# Patient Record
Sex: Male | Born: 1955 | Race: White | Hispanic: No | Marital: Married | Smoking: Former smoker
Health system: Southern US, Community
[De-identification: ages and names within clinical notes are randomized; demographics above are authoritative.]

## PROBLEM LIST (undated history)

## (undated) DIAGNOSIS — K760 Fatty (change of) liver, not elsewhere classified: Secondary | ICD-10-CM

## (undated) DIAGNOSIS — I10 Essential (primary) hypertension: Secondary | ICD-10-CM

## (undated) DIAGNOSIS — H20819 Fuchs' heterochromic cyclitis, unspecified eye: Secondary | ICD-10-CM

## (undated) DIAGNOSIS — I2510 Atherosclerotic heart disease of native coronary artery without angina pectoris: Secondary | ICD-10-CM

## (undated) DIAGNOSIS — G459 Transient cerebral ischemic attack, unspecified: Secondary | ICD-10-CM

## (undated) DIAGNOSIS — G4730 Sleep apnea, unspecified: Secondary | ICD-10-CM

## (undated) DIAGNOSIS — I4892 Unspecified atrial flutter: Principal | ICD-10-CM

## (undated) DIAGNOSIS — I639 Cerebral infarction, unspecified: Secondary | ICD-10-CM

## (undated) DIAGNOSIS — Z87442 Personal history of urinary calculi: Secondary | ICD-10-CM

## (undated) DIAGNOSIS — M1990 Unspecified osteoarthritis, unspecified site: Secondary | ICD-10-CM

## (undated) DIAGNOSIS — T884XXA Failed or difficult intubation, initial encounter: Secondary | ICD-10-CM

## (undated) DIAGNOSIS — M1711 Unilateral primary osteoarthritis, right knee: Secondary | ICD-10-CM

## (undated) DIAGNOSIS — J45909 Unspecified asthma, uncomplicated: Secondary | ICD-10-CM

## (undated) HISTORY — PX: KNEE CARTILAGE SURGERY: SHX688

## (undated) HISTORY — PX: TONSILLECTOMY: SUR1361

## (undated) HISTORY — PX: KIDNEY SURGERY: SHX687

## (undated) HISTORY — PX: BICEPS TENDON REPAIR: SHX566

## (undated) HISTORY — PX: EYE SURGERY: SHX253

## (undated) HISTORY — PX: CATARACT EXTRACTION W/ INTRAOCULAR LENS  IMPLANT, BILATERAL: SHX1307

## (undated) HISTORY — PX: HERNIA REPAIR: SHX51

---

## 1997-02-11 HISTORY — PX: CYSTOSCOPY W/ URETERAL STENT PLACEMENT: SHX1429

## 1997-10-10 ENCOUNTER — Emergency Department (HOSPITAL_COMMUNITY): Admission: EM | Admit: 1997-10-10 | Discharge: 1997-10-10 | Payer: Self-pay | Admitting: Emergency Medicine

## 1997-10-21 ENCOUNTER — Ambulatory Visit (HOSPITAL_COMMUNITY): Admission: RE | Admit: 1997-10-21 | Discharge: 1997-10-21 | Payer: Self-pay | Admitting: *Deleted

## 1997-10-25 ENCOUNTER — Ambulatory Visit (HOSPITAL_COMMUNITY): Admission: RE | Admit: 1997-10-25 | Discharge: 1997-10-25 | Payer: Self-pay | Admitting: *Deleted

## 1997-12-05 ENCOUNTER — Inpatient Hospital Stay (HOSPITAL_COMMUNITY): Admission: RE | Admit: 1997-12-05 | Discharge: 1997-12-09 | Payer: Self-pay | Admitting: Urology

## 1997-12-05 ENCOUNTER — Encounter: Payer: Self-pay | Admitting: Urology

## 1998-02-11 HISTORY — PX: OTHER SURGICAL HISTORY: SHX169

## 2000-07-10 ENCOUNTER — Ambulatory Visit (HOSPITAL_COMMUNITY): Admission: RE | Admit: 2000-07-10 | Discharge: 2000-07-10 | Payer: Self-pay | Admitting: Specialist

## 2000-07-16 ENCOUNTER — Encounter: Payer: Self-pay | Admitting: Specialist

## 2000-07-16 ENCOUNTER — Ambulatory Visit (HOSPITAL_COMMUNITY): Admission: RE | Admit: 2000-07-16 | Discharge: 2000-07-16 | Payer: Self-pay | Admitting: Specialist

## 2003-02-12 HISTORY — PX: ROTATOR CUFF REPAIR: SHX139

## 2004-08-01 ENCOUNTER — Ambulatory Visit (HOSPITAL_COMMUNITY): Admission: RE | Admit: 2004-08-01 | Discharge: 2004-08-01 | Payer: Self-pay | Admitting: Orthopedic Surgery

## 2005-06-12 ENCOUNTER — Encounter: Payer: Self-pay | Admitting: Emergency Medicine

## 2007-02-03 ENCOUNTER — Encounter: Admission: RE | Admit: 2007-02-03 | Discharge: 2007-02-03 | Payer: Self-pay | Admitting: Orthopedic Surgery

## 2008-02-12 DIAGNOSIS — G459 Transient cerebral ischemic attack, unspecified: Secondary | ICD-10-CM

## 2008-02-12 HISTORY — DX: Transient cerebral ischemic attack, unspecified: G45.9

## 2013-09-07 ENCOUNTER — Other Ambulatory Visit: Payer: Self-pay | Admitting: Family Medicine

## 2013-09-07 DIAGNOSIS — F17200 Nicotine dependence, unspecified, uncomplicated: Secondary | ICD-10-CM

## 2013-09-10 ENCOUNTER — Ambulatory Visit
Admission: RE | Admit: 2013-09-10 | Discharge: 2013-09-10 | Disposition: A | Discharge status: Home / Self Care | Payer: BC Managed Care – PPO | Source: Ambulatory Visit | Attending: Family Medicine | Admitting: Family Medicine

## 2013-09-10 DIAGNOSIS — F17200 Nicotine dependence, unspecified, uncomplicated: Secondary | ICD-10-CM

## 2013-10-12 ENCOUNTER — Emergency Department (HOSPITAL_COMMUNITY): Payer: BC Managed Care – PPO

## 2013-10-12 ENCOUNTER — Inpatient Hospital Stay (HOSPITAL_COMMUNITY)
Admission: EM | Admit: 2013-10-12 | Discharge: 2013-10-14 | DRG: 251 | Disposition: A | Discharge status: Home / Self Care | Payer: BC Managed Care – PPO | Attending: Cardiology | Admitting: Cardiology

## 2013-10-12 ENCOUNTER — Encounter (HOSPITAL_COMMUNITY): Payer: Self-pay | Admitting: Emergency Medicine

## 2013-10-12 DIAGNOSIS — I10 Essential (primary) hypertension: Secondary | ICD-10-CM | POA: Diagnosis present

## 2013-10-12 DIAGNOSIS — Z79899 Other long term (current) drug therapy: Secondary | ICD-10-CM

## 2013-10-12 DIAGNOSIS — R002 Palpitations: Secondary | ICD-10-CM | POA: Diagnosis not present

## 2013-10-12 DIAGNOSIS — Z885 Allergy status to narcotic agent status: Secondary | ICD-10-CM

## 2013-10-12 DIAGNOSIS — F17200 Nicotine dependence, unspecified, uncomplicated: Secondary | ICD-10-CM | POA: Diagnosis present

## 2013-10-12 DIAGNOSIS — I4892 Unspecified atrial flutter: Secondary | ICD-10-CM | POA: Diagnosis not present

## 2013-10-12 DIAGNOSIS — M129 Arthropathy, unspecified: Secondary | ICD-10-CM | POA: Diagnosis present

## 2013-10-12 DIAGNOSIS — Z8673 Personal history of transient ischemic attack (TIA), and cerebral infarction without residual deficits: Secondary | ICD-10-CM

## 2013-10-12 DIAGNOSIS — E876 Hypokalemia: Secondary | ICD-10-CM | POA: Diagnosis present

## 2013-10-12 HISTORY — DX: Unspecified atrial flutter: I48.92

## 2013-10-12 HISTORY — PX: ATRIAL ABLATION SURGERY: SHX560

## 2013-10-12 HISTORY — DX: Transient cerebral ischemic attack, unspecified: G45.9

## 2013-10-12 HISTORY — DX: Unspecified osteoarthritis, unspecified site: M19.90

## 2013-10-12 HISTORY — DX: Essential (primary) hypertension: I10

## 2013-10-12 LAB — CBC
HEMATOCRIT: 47.6 % (ref 39.0–52.0)
HEMOGLOBIN: 16.5 g/dL (ref 13.0–17.0)
MCH: 32.9 pg (ref 26.0–34.0)
MCHC: 34.7 g/dL (ref 30.0–36.0)
MCV: 95 fL (ref 78.0–100.0)
Platelets: 196 10*3/uL (ref 150–400)
RBC: 5.01 MIL/uL (ref 4.22–5.81)
RDW: 13 % (ref 11.5–15.5)
WBC: 12.6 10*3/uL — AB (ref 4.0–10.5)

## 2013-10-12 LAB — I-STAT CHEM 8, ED
BUN: 6 mg/dL (ref 6–23)
CALCIUM ION: 1.13 mmol/L (ref 1.12–1.23)
CHLORIDE: 104 meq/L (ref 96–112)
Creatinine, Ser: 1.2 mg/dL (ref 0.50–1.35)
GLUCOSE: 105 mg/dL — AB (ref 70–99)
HCT: 51 % (ref 39.0–52.0)
Hemoglobin: 17.3 g/dL — ABNORMAL HIGH (ref 13.0–17.0)
POTASSIUM: 3.2 meq/L — AB (ref 3.7–5.3)
Sodium: 139 mEq/L (ref 137–147)
TCO2: 26 mmol/L (ref 0–100)

## 2013-10-12 LAB — I-STAT TROPONIN, ED: TROPONIN I, POC: 0.02 ng/mL (ref 0.00–0.08)

## 2013-10-12 LAB — MAGNESIUM: MAGNESIUM: 2.3 mg/dL (ref 1.5–2.5)

## 2013-10-12 MED ORDER — OFF THE BEAT BOOK
Freq: Once | Status: AC
  Administered 2013-10-13
  Filled 2013-10-12 (×2): qty 1

## 2013-10-12 MED ORDER — HEPARIN (PORCINE) IN NACL 100-0.45 UNIT/ML-% IJ SOLN
1900.0000 [IU]/h | INTRAMUSCULAR | Status: AC
  Administered 2013-10-13: 1800 [IU]/h via INTRAVENOUS
  Administered 2013-10-13: 1600 [IU]/h via INTRAVENOUS
  Administered 2013-10-14: 1900 [IU]/h via INTRAVENOUS
  Filled 2013-10-12 (×4): qty 250

## 2013-10-12 MED ORDER — DILTIAZEM HCL 100 MG IV SOLR
20.0000 mg/h | INTRAVENOUS | Status: DC
  Administered 2013-10-12 – 2013-10-14 (×5): 20 mg/h via INTRAVENOUS
  Filled 2013-10-12 (×4): qty 100

## 2013-10-12 MED ORDER — DEXTROSE 5 % IV SOLN
5.0000 mg/h | Freq: Once | INTRAVENOUS | Status: AC
  Administered 2013-10-12: 10 mg/h via INTRAVENOUS

## 2013-10-12 MED ORDER — POTASSIUM CHLORIDE CRYS ER 20 MEQ PO TBCR
40.0000 meq | EXTENDED_RELEASE_TABLET | Freq: Once | ORAL | Status: AC
  Administered 2013-10-12: 40 meq via ORAL
  Filled 2013-10-12: qty 2

## 2013-10-12 MED ORDER — HYDROCHLOROTHIAZIDE 25 MG PO TABS
25.0000 mg | ORAL_TABLET | Freq: Every day | ORAL | Status: DC
  Administered 2013-10-13 – 2013-10-14 (×2): 25 mg via ORAL
  Filled 2013-10-12 (×2): qty 1

## 2013-10-12 MED ORDER — HEPARIN BOLUS VIA INFUSION
4000.0000 [IU] | Freq: Once | INTRAVENOUS | Status: AC
  Administered 2013-10-13: 4000 [IU] via INTRAVENOUS
  Filled 2013-10-12: qty 4000

## 2013-10-12 MED ORDER — HEPARIN BOLUS VIA INFUSION
5000.0000 [IU] | Freq: Once | INTRAVENOUS | Status: DC
  Filled 2013-10-12: qty 5000

## 2013-10-12 MED ORDER — DILTIAZEM HCL 25 MG/5ML IV SOLN
20.0000 mg | Freq: Once | INTRAVENOUS | Status: AC
  Administered 2013-10-12: 20 mg via INTRAVENOUS
  Filled 2013-10-12: qty 5

## 2013-10-12 NOTE — ED Notes (Signed)
Pt reports at 1500 today while working outside he started to feel weak and "my heart felt like it was fluttering." Pt was with physician at time who checked EKG and noted to be patient in A. Flutter, rate 140's. Pt reports weakness and mild dizziness. Denies CP or SOB. Pt currently in A Flutter, rate 130-150's. AO x 4. NAD.

## 2013-10-12 NOTE — Progress Notes (Signed)
ANTICOAGULATION CONSULT NOTE - Initial Consult  Pharmacy Consult for Heparin Indication: atrial fibrillation  Allergies  Allergen Reactions  . Codeine Itching    Patient Measurements: Height:  (193 cm) Weight: 238 lb 1.6 oz (108 kg) IBW/kg (Calculated) : 86.8 Heparin Dosing Weight: 108 kg  Vital Signs: Temp: 97.9 F (36.6 C) (09/01 2310) Temp src: Oral (09/01 2310) BP: 138/98 mmHg (09/01 2331) Pulse Rate: 135 (09/01 2331)  Labs:  Recent Labs  10/12/13 1925 10/12/13 2001  HGB 16.5 17.3*  HCT 47.6 51.0  PLT 196  --   CREATININE  --  1.20    Estimated Creatinine Clearance: 91.5 ml/min (by C-G formula based on Cr of 1.2).   Medical History: Past Medical History  Diagnosis Date  . Hypertension   . Arthritis     Medications:  Prescriptions prior to admission  Medication Sig Dispense Refill  . cyclobenzaprine (FLEXERIL) 10 MG tablet Take 10 mg by mouth 3 (three) times daily as needed for muscle spasms.      . hydrochlorothiazide (HYDRODIURIL) 25 MG tablet Take 25 mg by mouth daily.      Marland Kitchen ibuprofen (ADVIL,MOTRIN) 200 MG tablet Take 400 mg by mouth every 6 (six) hours as needed for mild pain.        Assessment: 58 y.o. male presents with "heart fluttering" and weakness. Pt found to be in afib. To begin heparin. CBC stable at baseline.  Goal of Therapy:  Heparin level 0.3-0.7 units/ml Monitor platelets by anticoagulation protocol: Yes   Plan:  1. Heparin IV bolus 4000 units 2. Heparin IV gtt at 1600 units/hr 3. Will f/u 6 hr heparin level  4. Daily heparin level and CBC  Christoper Fabian, PharmD, BCPS Clinical pharmacist, pager [phone] 10/12/2013,11:31 PM

## 2013-10-12 NOTE — ED Notes (Signed)
Receiving RN concerned of heart rate still in the 130's and level of potassium of 3.2.  Reported to Dr. Felix Pacini, MD advises that patient is stable, and is ordered /hr of cardizem presently for heart rate.  He gives verbal order of of K+ PO for electrolyte coverage.

## 2013-10-12 NOTE — ED Provider Notes (Signed)
CSN: [id number]     Arrival date & time 10/12/13  1912 History   First MD Initiated Contact with Patient 10/12/13 1940     Chief Complaint  Patient presents with  . Atrial Flutter     (Consider location/radiation/quality/duration/timing/severity/associated sxs/prior Treatment) HPI Complains of his heart racing onset approximately 3:30 PM today. Patient states he had a vague feeling of discomfort in his throat which lasted approximately one hour from 3:30 to 4:30 PM today. No other associated symptoms. He is presently asymptomatic. Denies shortness of breath denies chest pain no lightheadedness no treatment prior to coming here. He was evaluated by his primary care physician Dr. Jeannetta Nap who advised him to come here after an EKG was performed in the office which showed atrial flutter. Nothing makes symptoms better or worse. Past Medical History  Diagnosis Date  . Hypertension   . Arthritis    Past Surgical History  Procedure Laterality Date  . Hernia repair    . Tonsillectomy     No family history on file. History  Substance Use Topics  . Smoking status: Current Every Day Smoker -- 1.00 packs/day    Types: Cigarettes  . Smokeless tobacco: Not on file  . Alcohol Use: No    Review of Systems  Constitutional: Negative.   HENT: Negative.   Respiratory: Negative.   Cardiovascular: Positive for palpitations.  Gastrointestinal: Negative.   Musculoskeletal: Negative.   Skin: Negative.   Neurological: Negative.   Psychiatric/Behavioral: Negative.   All other systems reviewed and are negative.     Allergies  Codeine  Home Medications   Prior to Admission medications   Not on File   BP 147/118  Pulse 135  Temp(Src) 98.9 F (37.2 C) (Oral)  Resp 26  SpO2 97% Physical Exam  Nursing note and vitals reviewed. Constitutional: He appears well-developed and well-nourished.  HENT:  Head: Normocephalic and atraumatic.  Eyes: Conjunctivae are normal. Pupils are equal, round,  and reactive to light.  Neck: Neck supple. No tracheal deviation present. No thyromegaly present.  Cardiovascular: Regular rhythm.   No murmur heard. Tachycardic  Pulmonary/Chest: Effort normal and breath sounds normal.  Abdominal: Soft. Bowel sounds are normal. He exhibits no distension. There is no tenderness.  Musculoskeletal: Normal range of motion. He exhibits no edema and no tenderness.  Neurological: He is alert. Coordination normal.  Skin: Skin is warm and dry. No rash noted.  Psychiatric: He has a normal mood and affect.    ED Course  Procedures (including critical care time) Labs Review Labs Reviewed  CBC  MAGNESIUM  I-STAT TROPOININ, ED  I-STAT CHEM 8, ED    Imaging Review Dg Chest Port 1 View  10/12/2013   CLINICAL DATA:  ATRIAL FLUTTER  EXAM: PORTABLE CHEST - 1 VIEW  COMPARISON:  CT 09/10/2013  FINDINGS: Pulmonary hyperinflation with coarse peripheral interstitial markings. No focal infiltrate or overt edema. Heart size upper limits normal. Mildly tortuous thoracic aorta. No effusion. Visualized skeletal structures are unremarkable.  IMPRESSION: Hyperinflation without acute  disease.   Electronically Signed   By: Oley Balm M.D.   On: 10/12/2013 19:46     EKG Interpretation   Date/Time:  Tuesday October 12 2013 19:15:59 EDT Ventricular Rate:  137 PR Interval:    QRS Duration: 172 QT Interval:  378 QTC Calculation: 570 R Axis:   63 Text Interpretation:  Atrial flutter with variable A-V block Non-specific  intra-ventricular conduction block Abnormal ECG No old tracing to compare  Confirmed by Dr [facility]  MD, Doreatha Martin [phone]) on 10/12/2013 7:40:56 PM     8:30 PM patient remains tachycardic after treatment with Cardizem 20 mg IV bolus. Cardizem intravenous drip ordered. I've asked cardiology to consult and see patient in the emergency department  20 2:15 PM patient remains tachycardic on intravenous Cardizem drip. Results for orders placed during the hospital  encounter of 10/12/13  CBC      Result Value Ref Range   WBC 12.6 (*) 4.0 - 10.5 K/uL   RBC 5.01  4.22 - 5.81 MIL/uL   Hemoglobin 16.5  13.0 - 17.0 g/dL   HCT 60.4  54.0 - 98.1 %   MCV 95.0  78.0 - 100.0 fL   MCH 32.9  26.0 - 34.0 pg   MCHC 34.7  30.0 - 36.0 g/dL   RDW 19.1  47.8 - 29.5 %   Platelets 196  150 - 400 K/uL  MAGNESIUM      Result Value Ref Range   Magnesium 2.3  1.5 - 2.5 mg/dL  I-STAT TROPOININ, ED      Result Value Ref Range   Troponin i, poc 0.02  0.00 - 0.08 ng/mL   Comment 3           I-STAT CHEM 8, ED      Result Value Ref Range   Sodium 139  137 - 147 mEq/L   Potassium 3.2 (*) 3.7 - 5.3 mEq/L   Chloride 104  96 - 112 mEq/L   BUN 6  6 - 23 mg/dL   Creatinine, Ser 6.21  0.50 - 1.35 mg/dL   Glucose, Bld 308 (*) 70 - 99 mg/dL   Calcium, Ion 6.57  8.46 - 1.23 mmol/L   TCO2 26  0 - 100 mmol/L   Hemoglobin 17.3 (*) 13.0 - 17.0 g/dL   HCT 96.2  95.2 - 84.1 %   Dg Chest Port 1 View  10/12/2013   CLINICAL DATA:  ATRIAL FLUTTER  EXAM: PORTABLE CHEST - 1 VIEW  COMPARISON:  CT 09/10/2013  FINDINGS: Pulmonary hyperinflation with coarse peripheral interstitial markings. No focal infiltrate or overt edema. Heart size upper limits normal. Mildly tortuous thoracic aorta. No effusion. Visualized skeletal structures are unremarkable.  IMPRESSION: Hyperinflation without acute  disease.   Electronically Signed   By: Oley Balm M.D.   On: 10/12/2013 19:46    MDM  Dr.Vora from cardiology service evaluate patient and made arrangements for admission to step down unit. Final diagnoses:  None   Dx#1 atrial flutter #2 hypokalemia CRITICAL CARE Performed by: Doug Sou Total critical care time: 30 minute Critical care time was exclusive of separately billable procedures and treating other patients. Critical care was necessary to treat or prevent imminent or life-threatening deterioration. Critical care was time spent personally by me on the following activities:  development of treatment plan with patient and/or surrogate as well as nursing, discussions with consultants, evaluation of patient's response to treatment, examination of patient, obtaining history from patient or surrogate, ordering and performing treatments and interventions, ordering and review of laboratory studies, ordering and review of radiographic studies, pulse oximetry and re-evaluation of patient's condition.    Doug Sou, MD 10/12/13 2230

## 2013-10-12 NOTE — ED Notes (Signed)
EKG was given to Dr. Felix Pacini

## 2013-10-12 NOTE — ED Notes (Addendum)
Reported to Dr. Felix Pacini that heart rate still in 130's, with no change with the drip. Patient is asymptomatic, blood pressure stable. MD acknowledges, no new orders received.

## 2013-10-13 ENCOUNTER — Encounter (HOSPITAL_COMMUNITY): Payer: Self-pay | Admitting: Anesthesiology

## 2013-10-13 ENCOUNTER — Inpatient Hospital Stay (HOSPITAL_COMMUNITY): Payer: BC Managed Care – PPO | Admitting: Certified Registered"

## 2013-10-13 ENCOUNTER — Observation Stay (HOSPITAL_COMMUNITY): Payer: BC Managed Care – PPO | Admitting: Certified Registered"

## 2013-10-13 ENCOUNTER — Encounter (HOSPITAL_COMMUNITY)
Admission: EM | Disposition: A | Discharge status: Home / Self Care | Payer: Self-pay | Source: Home / Self Care | Attending: Cardiology

## 2013-10-13 DIAGNOSIS — Z79899 Other long term (current) drug therapy: Secondary | ICD-10-CM | POA: Diagnosis not present

## 2013-10-13 DIAGNOSIS — I4892 Unspecified atrial flutter: Secondary | ICD-10-CM | POA: Diagnosis present

## 2013-10-13 DIAGNOSIS — Z885 Allergy status to narcotic agent status: Secondary | ICD-10-CM | POA: Diagnosis not present

## 2013-10-13 DIAGNOSIS — R002 Palpitations: Secondary | ICD-10-CM | POA: Diagnosis present

## 2013-10-13 DIAGNOSIS — E876 Hypokalemia: Secondary | ICD-10-CM | POA: Diagnosis present

## 2013-10-13 DIAGNOSIS — Z8673 Personal history of transient ischemic attack (TIA), and cerebral infarction without residual deficits: Secondary | ICD-10-CM | POA: Diagnosis not present

## 2013-10-13 DIAGNOSIS — I10 Essential (primary) hypertension: Secondary | ICD-10-CM | POA: Diagnosis present

## 2013-10-13 DIAGNOSIS — F17200 Nicotine dependence, unspecified, uncomplicated: Secondary | ICD-10-CM | POA: Diagnosis present

## 2013-10-13 DIAGNOSIS — M129 Arthropathy, unspecified: Secondary | ICD-10-CM | POA: Diagnosis present

## 2013-10-13 HISTORY — PX: ATRIAL FLUTTER ABLATION: SHX5733

## 2013-10-13 LAB — BASIC METABOLIC PANEL
Anion gap: 10 (ref 5–15)
BUN: 7 mg/dL (ref 6–23)
CO2: 25 mEq/L (ref 19–32)
Calcium: 8.6 mg/dL (ref 8.4–10.5)
Chloride: 107 mEq/L (ref 96–112)
Creatinine, Ser: 0.89 mg/dL (ref 0.50–1.35)
GFR calc Af Amer: >90 mL/min (ref >90)
GFR calc non Af Amer: >90 mL/min (ref >90)
Glucose, Bld: 98 mg/dL (ref 70–99)
Potassium: 3.9 mEq/L (ref 3.7–5.3)
Sodium: 142 mEq/L (ref 137–147)

## 2013-10-13 LAB — APTT: aPTT: 59 seconds — ABNORMAL HIGH (ref 24–37)

## 2013-10-13 LAB — TSH: TSH: 1.21 u[IU]/mL (ref 0.350–4.500)

## 2013-10-13 LAB — GLUCOSE, CAPILLARY
GLUCOSE-CAPILLARY: 101 mg/dL — AB (ref 70–99)
Glucose-Capillary: 102 mg/dL — ABNORMAL HIGH (ref 70–99)

## 2013-10-13 LAB — MAGNESIUM: Magnesium: 2.1 mg/dL (ref 1.5–2.5)

## 2013-10-13 LAB — CBC
HEMATOCRIT: 42.5 % (ref 39.0–52.0)
Hemoglobin: 14.9 g/dL (ref 13.0–17.0)
MCH: 32.7 pg (ref 26.0–34.0)
MCHC: 35.1 g/dL (ref 30.0–36.0)
MCV: 93.4 fL (ref 78.0–100.0)
Platelets: 175 10*3/uL (ref 150–400)
RBC: 4.55 MIL/uL (ref 4.22–5.81)
RDW: 13.1 % (ref 11.5–15.5)
WBC: 9.6 10*3/uL (ref 4.0–10.5)

## 2013-10-13 LAB — HEPARIN LEVEL (UNFRACTIONATED)
Heparin Unfractionated: 0.17 IU/mL — ABNORMAL LOW (ref 0.30–0.70)
Heparin Unfractionated: 0.25 IU/mL — ABNORMAL LOW (ref 0.30–0.70)

## 2013-10-13 LAB — MRSA PCR SCREENING: MRSA BY PCR: NEGATIVE

## 2013-10-13 SURGERY — CARDIOVERSION
Anesthesia: Monitor Anesthesia Care

## 2013-10-13 SURGERY — ATRIAL FLUTTER ABLATION
Anesthesia: General

## 2013-10-13 MED ORDER — LIDOCAINE HCL (CARDIAC) 20 MG/ML IV SOLN
INTRAVENOUS | Status: DC | PRN
  Administered 2013-10-13: 50 mg via INTRAVENOUS

## 2013-10-13 MED ORDER — ACETAMINOPHEN 325 MG PO TABS: 650.0000 mg | ORAL_TABLET | ORAL | Status: DC | PRN

## 2013-10-13 MED ORDER — APIXABAN 5 MG PO TABS
5.0000 mg | ORAL_TABLET | Freq: Two times a day (BID) | ORAL | Status: DC
  Administered 2013-10-14: 5 mg via ORAL
  Filled 2013-10-13 (×3): qty 1

## 2013-10-13 MED ORDER — PHENYLEPHRINE HCL 10 MG/ML IJ SOLN
INTRAMUSCULAR | Status: DC | PRN
  Administered 2013-10-13: 80 ug via INTRAVENOUS
  Administered 2013-10-13 (×2): 100 ug via INTRAVENOUS
  Administered 2013-10-13 (×2): 80 ug via INTRAVENOUS
  Administered 2013-10-13 (×2): 120 ug via INTRAVENOUS

## 2013-10-13 MED ORDER — SODIUM CHLORIDE 0.9 % IV SOLN: INTRAVENOUS | Status: DC

## 2013-10-13 MED ORDER — HEPARIN (PORCINE) IN NACL 2-0.9 UNIT/ML-% IJ SOLN
INTRAMUSCULAR | Status: AC
  Filled 2013-10-13: qty 500

## 2013-10-13 MED ORDER — PROPOFOL 10 MG/ML IV BOLUS
INTRAVENOUS | Status: DC | PRN
  Administered 2013-10-13: 200 mg via INTRAVENOUS

## 2013-10-13 MED ORDER — BUPIVACAINE HCL (PF) 0.25 % IJ SOLN
INTRAMUSCULAR | Status: AC
  Filled 2013-10-13: qty 60

## 2013-10-13 MED ORDER — ONDANSETRON HCL 4 MG/2ML IJ SOLN: 4.0000 mg | Freq: Four times a day (QID) | INTRAMUSCULAR | Status: DC | PRN

## 2013-10-13 MED ORDER — SODIUM CHLORIDE 0.9 % IV SOLN: INTRAVENOUS | Status: AC

## 2013-10-13 MED ORDER — HYDROMORPHONE HCL PF 1 MG/ML IJ SOLN: 0.2500 mg | INTRAMUSCULAR | Status: DC | PRN

## 2013-10-13 MED ORDER — CHLORHEXIDINE GLUCONATE CLOTH 2 % EX PADS: 6.0000 | MEDICATED_PAD | Freq: Once | CUTANEOUS | Status: DC

## 2013-10-13 MED ORDER — LACTATED RINGERS IV SOLN
INTRAVENOUS | Status: DC | PRN
  Administered 2013-10-13 (×2): via INTRAVENOUS

## 2013-10-13 MED ORDER — SODIUM CHLORIDE 0.9 % IJ SOLN: 3.0000 mL | INTRAMUSCULAR | Status: DC | PRN

## 2013-10-13 MED ORDER — PHENYLEPHRINE HCL 10 MG/ML IJ SOLN
10.0000 mg | INTRAVENOUS | Status: DC | PRN
  Administered 2013-10-13: 40 ug/min via INTRAVENOUS

## 2013-10-13 MED ORDER — POTASSIUM CHLORIDE CRYS ER 20 MEQ PO TBCR
40.0000 meq | EXTENDED_RELEASE_TABLET | Freq: Once | ORAL | Status: AC
  Administered 2013-10-13: 40 meq via ORAL
  Filled 2013-10-13: qty 2

## 2013-10-13 MED ORDER — SODIUM CHLORIDE 0.9 % IJ SOLN
3.0000 mL | Freq: Two times a day (BID) | INTRAMUSCULAR | Status: DC
  Administered 2013-10-13 – 2013-10-14 (×2): 3 mL via INTRAVENOUS

## 2013-10-13 MED ORDER — FENTANYL CITRATE 0.05 MG/ML IJ SOLN
INTRAMUSCULAR | Status: DC | PRN
  Administered 2013-10-13 (×3): 50 ug via INTRAVENOUS
  Administered 2013-10-13: 25 ug via INTRAVENOUS

## 2013-10-13 MED ORDER — MIDAZOLAM HCL 5 MG/5ML IJ SOLN
INTRAMUSCULAR | Status: DC | PRN
  Administered 2013-10-13: 2 mg via INTRAVENOUS

## 2013-10-13 MED ORDER — SODIUM CHLORIDE 0.9 % IV SOLN: 250.0000 mL | INTRAVENOUS | Status: AC | PRN

## 2013-10-13 NOTE — Progress Notes (Signed)
UR Completed.  Curtis Barton [name] [phone] 10/13/2013  

## 2013-10-13 NOTE — Transfer of Care (Signed)
Immediate Anesthesia Transfer of Care Note  Patient: Curtis Barton  Procedure(s) Performed: Procedure(s): ATRIAL FLUTTER ABLATION (N/A)  Patient Location: PACU  Anesthesia Type:General  Level of Consciousness: awake, alert , oriented and patient cooperative  Airway & Oxygen Therapy: Patient Spontanous Breathing and Patient connected to nasal cannula oxygen  Post-op Assessment: Report given to PACU RN and Post -op Vital signs reviewed and stable  Post vital signs: Reviewed and stable  Complications: No apparent anesthesia complications

## 2013-10-13 NOTE — Consult Note (Addendum)
ELECTROPHYSIOLOGY CONSULT NOTE  Patient ID: TASMAN ZAPATA, MRN: [id number], DOB/AGE: [birth year] 58 y.o. Admit date: 10/12/2013 Date of Consult: 10/13/2013  Primary Physician: Kaleen Mask, MD Primary Cardiologist: bc Chief Complaint: ATRIAL FLUTTER   HPI KEANTE URIZAR is a 58 y.o. male  ADMITTED  YDAY with tachypalps which occurred afollowing a colonscopy done earlier that day He was disturbed by palpitations and throat discomfort but no chest pain or shortness of breath  Efforts at rate control with dilt have had little impact   Heparin was started  Echo pending      Past Medical History  Diagnosis Date  . Hypertension   . Arthritis   . TIA (transient ischemic attack)   . Atrial flutter       Surgical History:  Past Surgical History  Procedure Laterality Date  . Hernia repair    . Tonsillectomy       Home Meds: Prior to Admission medications   Medication Sig Start Date End Date Taking? Authorizing Provider  cyclobenzaprine (FLEXERIL) 10 MG tablet Take 10 mg by mouth 3 (three) times daily as needed for muscle spasms.   Yes Historical Provider, MD  hydrochlorothiazide (HYDRODIURIL) 25 MG tablet Take 25 mg by mouth daily.   Yes Historical Provider, MD  ibuprofen (ADVIL,MOTRIN) 200 MG tablet Take 400 mg by mouth every 6 (six) hours as needed for mild pain.   Yes Historical Provider, MD    Inpatient Medications:  . Chlorhexidine Gluconate Cloth  6 each Topical Once  . [facility] HOLD] hydrochlorothiazide  25 mg Oral Daily    Allergies:  Allergies  Allergen Reactions  . Codeine Itching    History   Social History  . Marital Status: Married    Spouse Name: N/A    Number of Children: N/A  . Years of Education: N/A   Occupational History  . Not on file.   Social History Main Topics  . Smoking status: Current Every Day Smoker -- 1.00 packs/day    Types: Cigarettes  . Smokeless tobacco: Not on file  . Alcohol Use: No  . Drug Use: No  . Sexual  Activity: Not on file   Other Topics Concern  . Not on file   Social History Narrative  . No narrative on file     No family history on file.   ROS:  Please see the history of present illness.     All other systems reviewed and negative.    Physical Exam: Blood pressure 113/89, pulse 140, temperature 98.2 F (36.8 C), temperature source Oral, resp. rate 22, height  (1.93 m), weight 238 lb 1.6 oz (108 kg), SpO2 92.00%. General: Well developed, well nourished male in no acute distress. Head: Normocephalic, atraumatic, sclera non-icteric, no xanthomas, nares are without discharge. EENT: normal Lymph Nodes:  none Back: without scoliosis/kyphosis, no CVA tendersness Neck: Negative for carotid bruits. JVD not elevated. Lungs: Clear bilaterally to auscultation without wheezes, rales, or rhonchi. Breathing is unlabored. Heart: Rapid but RR with S1 S2. No murmur , rubs, or gallops appreciated. Abdomen: Soft, non-tender, non-distended with normoactive bowel sounds. No hepatomegaly. No rebound/guarding. No obvious abdominal masses. Msk:  Strength and tone appear normal for age. Extremities: No clubbing or cyanosis. No edema.  Distal pedal pulses are 2+ and equal bilaterally. Skin: Warm and Dry Neuro: Alert and oriented X 3. CN III-XII intact Grossly normal sensory and motor function . Psych:  Responds to questions appropriately with a normal affect.  Labs: Cardiac Enzymes No results found for this basename: CKTOTAL, CKMB, TROPONINI,  in the last 72 hours CBC Lab Results  Component Value Date   WBC 9.6 10/13/2013   HGB 14.9 10/13/2013   HCT 42.5 10/13/2013   MCV 93.4 10/13/2013   PLT 175 10/13/2013   PROTIME: No results found for this basename: LABPROT, INR,  in the last 72 hours Chemistry   Recent Labs Lab 10/13/13 0650  NA 142  K 3.9  CL 107  CO2 25  BUN 7  CREATININE 0.89  CALCIUM 8.6  GLUCOSE 98   Lipids No results found for this basename: CHOL,  HDL,  LDLCALC,   TRIG   BNP No results found for this basename: probnp   Miscellaneous No results found for this basename: DDIMER    Radiology/Studies:  Dg Chest Port 1 View  10/12/2013   CLINICAL DATA:  ATRIAL FLUTTER  EXAM: PORTABLE CHEST - 1 VIEW  COMPARISON:  CT 09/10/2013  FINDINGS: Pulmonary hyperinflation with coarse peripheral interstitial markings. No focal infiltrate or overt edema. Heart size upper limits normal. Mildly tortuous thoracic aorta. No effusion. Visualized skeletal structures are unremarkable.  IMPRESSION: Hyperinflation without acute  disease.   Electronically Signed   By: Oley Balm M.D.   On: 10/12/2013 19:46    EKG: Atrial flutter with 2:1 conduction   Assessment and Plan:  Atrial flutter  Hypertension  TIA   Pt with symptomatic atrial flutter typical by ECG  Treatment options include DCCV with statistical high likelihood of recurrence  and RFCA which significnatly decreases ot   Both are also assoc with risk of atrial fibrillation.  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  Have reviewed risks and benefits  Await echo preliminary ( by me ) reasonable LV function  wlll need MRI to look for old strokes    Sherryl Manges

## 2013-10-13 NOTE — Anesthesia Postprocedure Evaluation (Signed)
  Anesthesia Post-op Note  Patient: Curtis Barton  Procedure(s) Performed: Procedure(s): ATRIAL FLUTTER ABLATION (N/A)  Patient Location: PACU  Anesthesia Type: General   Level of Consciousness: awake, alert  and oriented  Airway and Oxygen Therapy: Patient Spontanous Breathing  Post-op Pain: mild  Post-op Assessment: Post-op Vital signs reviewed  Post-op Vital Signs: Reviewed  Last Vitals:  Filed Vitals:   10/13/13 1802  BP: 122/68  Pulse: 53  Temp:   Resp: 20    Complications: No apparent anesthesia complications

## 2013-10-13 NOTE — Progress Notes (Signed)
ANTICOAGULATION CONSULT NOTE  Pharmacy Consult for Heparin Indication: atrial fibrillation  Allergies  Allergen Reactions  . Codeine Itching    Labs:  Recent Labs  10/12/13 1925 10/12/13 2001 10/13/13 0650  HGB 16.5 17.3* 14.9  HCT 47.6 51.0 42.5  PLT 196  --  175  APTT  --   --  59*  HEPARINUNFRC  --   --  0.17*  CREATININE  --  1.20 0.89    Estimated Creatinine Clearance: 123.4 ml/min (by C-G formula based on Cr of 0.89).   Medical History: Past Medical History  Diagnosis Date  . Hypertension   . Arthritis    Assessment: 58 y.o. male presents with "heart fluttering" and weakness. Pt found to be in afib.   Heparin level = 0.17 (sub - therapeutic)  Goal of Therapy:  Heparin level 0.3-0.7 units/ml Monitor platelets by anticoagulation protocol: Yes   Plan:  1. Increase heparin to 1800 units / hr 2. 6 hour heparin level  Thank you. Okey Regal, PharmD [phone] 10/13/2013,9:02 AM

## 2013-10-13 NOTE — CV Procedure (Signed)
Curtis Barton [id number]  [id number]  Preop NW:GNFAOZ flutter Postop Dx same/ sinus rhtyhm  Procedure:EPS Arrhythmia mapping  RF catheter ablation  Cx: None   EBL: Minimal    Dictation number [id number]  Sherryl Manges, MD 10/13/2013 4:18 PM

## 2013-10-13 NOTE — H&P (Signed)
Patient ID: Curtis Barton MRN: [id number], DOB/AGE: 57-15-1957   Admit date: 10/12/2013   Primary Physician: Kaleen Mask, MD Primary Cardiologist: None  CC: Palpitations  Problem List  Past Medical History  Diagnosis Date  . Hypertension   . Arthritis     Past Surgical History  Procedure Laterality Date  . Hernia repair    . Tonsillectomy       Allergies  Allergies  Allergen Reactions  . Codeine Itching    HPI The patient is a 58M with a history of HTN who was in his usual state of health earlier today. He reports that he had a colonoscopy yesterday after a positive FOB test but he was working outside at around 3-4pm and he suddenly started to experience palpitations in addition to mild throat discomfort.He denied chest pain or shortness of breath. He was evaluated by his PCP who confirmed AFL on EKG and directed him to the ED for therapy.  In the ED he was found to have HR in the 130s-140 but was otherwise stable. He was started on a diltiazem gtt with minimal effect.  Of note, he reports drinking 3 cups of coffee daily. Does not have thyroid issue that he knows of. He smokes 1 pack daily x 40 years.  He does not have a known cardiac history.   Home Medications  Prior to Admission medications   Medication Sig Start Date End Date Taking? Authorizing Provider  cyclobenzaprine (FLEXERIL) 10 MG tablet Take 10 mg by mouth 3 (three) times daily as needed for muscle spasms.   Yes Historical Provider, MD  hydrochlorothiazide (HYDRODIURIL) 25 MG tablet Take 25 mg by mouth daily.   Yes Historical Provider, MD  ibuprofen (ADVIL,MOTRIN) 200 MG tablet Take 400 mg by mouth every 6 (six) hours as needed for mild pain.   Yes Historical Provider, MD    Family History  Noncontributory  Social History  History   Social History  . Marital Status: Married    Spouse Name: N/A    Number of Children: N/A  . Years of Education: N/A   Occupational History  . Not  on file.   Social History Main Topics  . Smoking status: Current Every Day Smoker -- 1.00 packs/day    Types: Cigarettes  . Smokeless tobacco: Not on file  . Alcohol Use: No  . Drug Use: No  . Sexual Activity: Not on file   Other Topics Concern  . Not on file   Social History Narrative  . No narrative on file     Review of Systems General:  No chills, fever, night sweats or weight changes.  Cardiovascular:  +Palpitations, No chest pain, dyspnea on exertion, edema, orthopnea, paroxysmal nocturnal dyspnea. Dermatological: No rash, lesions/masses Respiratory: No cough, dyspnea Urologic: No hematuria, dysuria Abdominal:   No nausea, vomiting, diarrhea, bright red blood per rectum, melena, or hematemesis Neurologic:  No visual changes, wkns, changes in mental status. All other systems reviewed and are otherwise negative except as noted above.  Physical Exam  Blood pressure 121/91, pulse 137, temperature 98 F (36.7 C), temperature source Oral, resp. rate 17, height  (1.93 m), weight 238 lb 1.6 oz (108 kg), SpO2 95.00%.  General: Pleasant, NAD Psych: Normal affect. Neuro: Alert and oriented X 3. Moves all extremities spontaneously. HEENT: Normal  Neck: Supple without bruits or JVD. Lungs:  Resp regular and unlabored, CTA. Heart: tachycardia no s3, s4, or murmurs. Abdomen: Soft, non-tender, non-distended, BS + x 4.  Extremities: No clubbing, cyanosis or edema. DP/PT/Radials 2+ and equal bilaterally.  Labs  Troponin [facility] Test)  Recent Labs  10/12/13 1959  TROPIPOC 0.02   No results found for this basename: CKTOTAL, CKMB, TROPONINI,  in the last 72 hours Lab Results  Component Value Date   WBC 12.6* 10/12/2013   HGB 17.3* 10/12/2013   HCT 51.0 10/12/2013   MCV 95.0 10/12/2013   PLT 196 10/12/2013    Recent Labs Lab 10/12/13 2001  NA 139  K 3.2*  CL 104  BUN 6  CREATININE 1.20  GLUCOSE 105*    Radiology/Studies  Dg Chest Port 1 View  10/12/2013    CLINICAL DATA:  ATRIAL FLUTTER  EXAM: PORTABLE CHEST - 1 VIEW  COMPARISON:  CT 09/10/2013  FINDINGS: Pulmonary hyperinflation with coarse peripheral interstitial markings. No focal infiltrate or overt edema. Heart size upper limits normal. Mildly tortuous thoracic aorta. No effusion. Visualized skeletal structures are unremarkable.  IMPRESSION: Hyperinflation without acute  disease.   Electronically Signed   By: Oley Balm M.D.   On: 10/12/2013 19:46    ECG Typical AFL with rates in the 130s, nonspecific ST-TW changes. No prior EKG for comparison  ASSESSMENT AND PLAN The patient is a 58M with a history of HTN presenting with palpitations, found to be in AFL  1. Atrial flutter: He seems to have typical atrial flutter on his EKG at an almost constant rate of 134-136 and has been refractory to escalating doses of diltiazem infusion. Trigger is unclear but may be related to his caffeine intake, possible dehydration, obesity. At this point he is otherwise hemodynamically stable and relatively asymptomatic despite his high HR. I suspect that his AFL will be difficult to control with medications alone.  -continue diltiazem for now -can consider either DCCV or even possibly CTI ablation given the typical nature of his AFL. Would probably try DCCV first to see if he resets to NSR. -start heparin gtt overnight. If a rhythm control strategy is pursued, would favor a NOAC for stroke ppx for now. He is CHADS-VASC =1 for HTN and thus may not require aggressive long-term anticoagulation -TTE to evaluate for structural heart disease -Check TSH in AM -treat hypokalemia  Signed, Sammuel Hines, MD MPH 10/13/2013, 12:43 AM As above, patient seen and examined. Patient presents with new onset atrial flutter. His symptoms began at approximately 3 PM yesterday. He remains in atrial flutter this morning. Continue Cardizem. Continue heparin. I will review with Dr. Graciela Husbands. Question ablation today. If not would proceed  with cardioversion as his symptoms are less than 24 hours duration. He could then see of electrophysiology as an outpatient to consider ablation. Check echocardiogram and TSH. Embolic risk factor of hypertension. I would favor anticoagulation until ablation performed. Begin apixaban following DCCV.  Olga Millers

## 2013-10-13 NOTE — Progress Notes (Signed)
ANTICOAGULATION CONSULT NOTE  Pharmacy Consult for Heparin Indication: atrial fibrillation  Allergies  Allergen Reactions  . Codeine Itching    Labs:  Recent Labs  10/12/13 1925 10/12/13 2001 10/13/13 0650 10/13/13 1905  HGB 16.5 17.3* 14.9  --   HCT 47.6 51.0 42.5  --   PLT 196  --  175  --   APTT  --   --  59*  --   HEPARINUNFRC  --   --  0.17* 0.25*  CREATININE  --  1.20 0.89  --     Estimated Creatinine Clearance: 123.4 ml/min (by C-G formula based on Cr of 0.89).   Medical History: Past Medical History  Diagnosis Date  . Hypertension   . Arthritis   . TIA (transient ischemic attack)   . Atrial flutter    Assessment: 58 y.o. male presents with "heart fluttering" and weakness, found to be in afib. Pharmacy consulted to dose heparin and convert to apixaban in am of 9/3  PMH: HTN, TIA  Coag: afib, heparin remains < goal at 0.25 on 1800 units/hr s/p catheter ablation, CHADS 2 score = 3.  Goal of Therapy:  Heparin level 0.3-0.7 units/ml Monitor platelets by anticoagulation protocol: Yes   Plan:  1. Increase heparin to 1900 units / hr 2. 6 hour heparin level 3. D/C heparin at 8am and give first dose of apixaban 5 mg PO bid 4. Follow periodic CBC  Thank you for allowing pharmacy to be a part of this patients care team.  Lovenia Kim Pharm.D., BCPS, AQ-Cardiology Clinical Pharmacist 10/13/2013 7:53 PM Pager: [phone] Phone: [phone]

## 2013-10-13 NOTE — Progress Notes (Addendum)
Will need outpt sleep study With prior TIA will do MRI looking for any other causes to exclude Transition to NOAC in am

## 2013-10-13 NOTE — Progress Notes (Signed)
Patient rhythm remains aflutter with rate 130-138 remains on cardizem drip at 20 mg/hr.Placed on oxygen at 2 lnc to maintain saturations greater than 92%.Patient denies pain or shortness of breath at present time. Dr.Vora  Aware.No plans to change medication at present time.Patient for possible cardioversion in morning.Will continue to monitor patient and notify MD of changes.

## 2013-10-13 NOTE — Anesthesia Preprocedure Evaluation (Addendum)
Anesthesia Evaluation  Patient identified by MRN, date of birth, ID band Patient awake    Reviewed: Allergy & Precautions, H&P , NPO status , Patient's Chart, lab work & pertinent test results  Airway Mallampati: II      Dental   Pulmonary Current Smoker,          Cardiovascular hypertension, + dysrhythmias Atrial Fibrillation Rhythm:Regular Rate:Normal     Neuro/Psych    GI/Hepatic negative GI ROS, Neg liver ROS,   Endo/Other  negative endocrine ROS  Renal/GU negative Renal ROS     Musculoskeletal   Abdominal   Peds  Hematology   Anesthesia Other Findings   Reproductive/Obstetrics                          Anesthesia Physical Anesthesia Plan  ASA: III  Anesthesia Plan: General   Post-op Pain Management:    Induction: Intravenous  Airway Management Planned: LMA  Additional Equipment:   Intra-op Plan:   Post-operative Plan: Extubation in OR  Informed Consent: I have reviewed the patients History and Physical, chart, labs and discussed the procedure including the risks, benefits and alternatives for the proposed anesthesia with the patient or authorized representative who has indicated his/her understanding and acceptance.   Dental advisory given  Plan Discussed with: CRNA and Anesthesiologist  Anesthesia Plan Comments:         Anesthesia Quick Evaluation

## 2013-10-13 NOTE — Progress Notes (Signed)
  Echocardiogram 2D Echocardiogram has been performed.  Leta Jungling M 10/13/2013, 1:17 PM

## 2013-10-13 NOTE — Anesthesia Procedure Notes (Signed)
Procedure Name: LMA Insertion Date/Time: 10/13/2013 2:41 PM Performed by: Marena Chancy Pre-anesthesia Checklist: Patient identified, Timeout performed, Emergency Drugs available, Suction available and Patient being monitored Patient Re-evaluated:Patient Re-evaluated prior to inductionOxygen Delivery Method: Circle system utilized Preoxygenation: Pre-oxygenation with 100% oxygen Intubation Type: IV induction LMA: LMA inserted LMA Size: 5.0 Number of attempts: 1 Placement Confirmation: breath sounds checked- equal and bilateral and positive ETCO2 Tube secured with: Tape Dental Injury: Teeth and Oropharynx as per pre-operative assessment

## 2013-10-13 NOTE — Plan of Care (Signed)
Problem: Consults Goal: Atrial Arhythmia Patient Education (See Patient Education module for education specifics.)  Outcome: Progressing Off the beat book given to patient. Handout on medication cardizem given and handout for cardioversion given.

## 2013-10-14 ENCOUNTER — Other Ambulatory Visit: Payer: Self-pay | Admitting: Physician Assistant

## 2013-10-14 DIAGNOSIS — G459 Transient cerebral ischemic attack, unspecified: Secondary | ICD-10-CM

## 2013-10-14 LAB — CBC
HCT: 40.4 % (ref 39.0–52.0)
HEMOGLOBIN: 13.7 g/dL (ref 13.0–17.0)
MCH: 32.1 pg (ref 26.0–34.0)
MCHC: 33.9 g/dL (ref 30.0–36.0)
MCV: 94.6 fL (ref 78.0–100.0)
Platelets: 152 10*3/uL (ref 150–400)
RBC: 4.27 MIL/uL (ref 4.22–5.81)
RDW: 13.2 % (ref 11.5–15.5)
WBC: 10.8 10*3/uL — AB (ref 4.0–10.5)

## 2013-10-14 LAB — HEPARIN LEVEL (UNFRACTIONATED): HEPARIN UNFRACTIONATED: 0.3 [IU]/mL (ref 0.30–0.70)

## 2013-10-14 MED ORDER — APIXABAN 5 MG PO TABS: 5.0000 mg | ORAL_TABLET | Freq: Two times a day (BID) | ORAL | Status: DC

## 2013-10-14 NOTE — Progress Notes (Signed)
Nursing staff contacted cardiology team as patient began to have bandlike dull chest pain across the anterior chest since waking up this morning. According to the patient, degree of chest pain is 2/10 at this time. He has never had this type of chest discomfort before. 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 He has not noted any exertional chest discomfort nor has he had any intervention on his heart before this admission. He was admitted for symptomatic atrial fibrillation and underwent atrial fibrillation ablation procedure yesterday without significant complication. EKG from this morning showed no ischemic changes, he does have some mild LVH and PACs on EKG. Telemetry also noticed some PACs overnight, however he has been in normal sinus rhythm with heart rates in the 60 to 80s, no significant ventricular ectopy. According to the patient, the chest discomfort is worsened with deep inspiration.  Since patient has been active at home without exertional chest discomfort, EKG also showed no sign of ischemia, low probability for ACS. Would not order troponins at this time after ablation procedure as it will likely come back abnormal anyway. I have advised the nurse to ambulate the patient later to see if his symptom is worsened with exertion or not. He is pending MRI of the brain today, however it is being delayed as MRI has several stroke patients prior to him. He may be discharged either later today or tomorrow. Will discuss with Dr. Graciela Husbands  Signed, Azalee Course PA Pager: [phone]

## 2013-10-14 NOTE — Progress Notes (Signed)
Patient Name: Curtis Barton      SUBJECTIVE: no cmplaints   Past Medical History  Diagnosis Date  . Hypertension   . Arthritis   . TIA (transient ischemic attack)   . Atrial flutter     Scheduled Meds:  Scheduled Meds: . apixaban  5 mg Oral BID  . hydrochlorothiazide  25 mg Oral Daily  . sodium chloride  3 mL Intravenous Q12H   Continuous Infusions: . diltiazem (CARDIZEM) infusion 20 mg/hr (10/14/13 0402)  . heparin 1,900 Units/hr (10/14/13 0400)   acetaminophen, ondansetron (ZOFRAN) IV, sodium chloride    PHYSICAL EXAM Filed Vitals:   10/13/13 2300 10/14/13 0000 10/14/13 0456 10/14/13 0500  BP: 109/58 105/58 123/63 123/63  Pulse: 64 70 75 82  Temp:  98.2 F (36.8 C) 98 F (36.7 C)   TempSrc:  Oral Oral   Resp: Height:      Weight:      SpO2: 96% 95% 94% 97%    Well developed and nourished in no acute distress HENT normal Neck supple Clear Regular rate and rhythm, no murmurs or gallops Abd-soft with active BS   groinds ok No Clubbing cyanosis edema Skin-warm and dry A & Oriented  Grossly normal sensory and motor function  TELEMETRY: Reviewed telemetry pt in *nsr   Intake/Output Summary (Last 24 hours) at 10/14/13 0751 Last data filed at 10/14/13 0700  Gross per 24 hour  Intake 2166.35 ml  Output   1930 ml  Net 236.35 ml    LABS: Basic Metabolic Panel:  Recent Labs Lab 10/12/13 1925 10/12/13 2001 10/13/13 0650  NA  --  139 142  K  --  3.2* 3.9  CL  --  104 107  CO2  --   --  25  GLUCOSE  --  105* 98  BUN  --  6 7  CREATININE  --  1.20 0.89  CALCIUM  --   --  8.6  MG 2.3  --  2.1   Cardiac Enzymes: No results found for this basename: CKTOTAL, CKMB, CKMBINDEX, TROPONINI,  in the last 72 hours CBC:  Recent Labs Lab 10/12/13 1925 10/12/13 2001 10/13/13 0650 10/14/13 0125  WBC 12.6*  --  9.6 10.8*  HGB 16.5 17.3* 14.9 13.7  HCT 47.6 51.0 42.5 40.4  MCV 95.0  --  93.4 94.6  PLT 196  --  175 152    PROTIME: No results found for this basename: LABPROT, INR,  in the last 72 hours Liver Function Tests: No results found for this basename: AST, ALT, ALKPHOS, BILITOT, PROT, ALBUMIN,  in the last 72 hours No results found for this basename: LIPASE, AMYLASE,  in the last 72 hours BNP: BNP (last 3 results) No results found for this basename: PROBNP,  in the last 8760 hours D-Dimer: No results found for this basename: DDIMER,  in the last 72 hours Hemoglobin A1C: No results found for this basename: HGBA1C,  in the last 72 hours Fasting Lipid Panel: No results found for this basename: CHOL, HDL, LDLCALC, TRIG, CHOLHDL, LDLDIRECT,  in the last 72 hours Thyroid Function Tests:  Recent Labs  10/13/13 0226  TSH 1.210      ASSESSMENT AND PLAN:  Active Problems:   Atrial flutter  Post ablation  Needs transiton from IV heparin to NOAC  Will use TIA as indication awiat brain MRI and needs outpt sleep study Post procedure recs reviewed F/u SK 6  wks  Signed, Sherryl Manges MD  10/14/2013

## 2013-10-14 NOTE — Progress Notes (Signed)
ELIQUIS 5 MG BID IS A TIER 4 MEDICATION AND WILL BE $50 FOR A 30-DAY SUPPLY.

## 2013-10-14 NOTE — Care Management Note (Signed)
    Page 1 of 1   10/14/2013     2:58:37 PM CARE MANAGEMENT NOTE 10/14/2013  Patient:  Curtis Barton, Curtis Barton   Account Number:  [id number]  Date Initiated:  10/14/2013  Documentation initiated by:  Shalee Paolo  Subjective/Objective Assessment:   dx AFib w/RVR     DC Planning Services  CM consult      Status of service:  In process, will continue to follow  Per UR Regulation:  Reviewed for med. necessity/level of care/duration of stay  Comments:  10/14/13 1333 Monte Zinni RN MSN BSN CCM Eliquis 5 mg BID is a tier 4 medication on pt's drug plan and copay will be $50 for a 30-day supply.  Provided pt with card for 30-day free trial as well as card to apply for $10 copay plan.

## 2013-10-14 NOTE — Progress Notes (Signed)
Discharge instructions reviewed with patient and wife both verbalized understanding. 

## 2013-10-14 NOTE — Progress Notes (Signed)
When receiving report nurse was made aware that Cardizem gtt was infusing per orders. Patient is currently SR 70's. Cardiology notified and made aware. Orders pending

## 2013-10-14 NOTE — Discharge Instructions (Addendum)
Information on my medicine - ELIQUIS (apixaban)  This medication education was reviewed with me or my healthcare representative as part of my discharge preparation.  The pharmacist that spoke with me during my hospital stay was:  Elwin Sleight, [facility]  Why was Eliquis prescribed for you? Eliquis was prescribed for you to reduce the risk of a blood clot forming that can cause a stroke if you have a medical condition called atrial fibrillation (a type of irregular heartbeat).  What do You need to know about Eliquis ? Take your Eliquis TWICE DAILY - one tablet in the morning and one tablet in the evening with or without food. If you have difficulty swallowing the tablet whole please discuss with your pharmacist how to take the medication safely.  Take Eliquis exactly as prescribed by your doctor and DO NOT stop taking Eliquis without talking to the doctor who prescribed the medication.  Stopping may increase your risk of developing a stroke.  Refill your prescription before you run out.  After discharge, you should have regular check-up appointments with your healthcare provider that is prescribing your Eliquis.  In the future your dose may need to be changed if your kidney function or weight changes by a significant amount or as you get older.  What do you do if you miss a dose? If you miss a dose, take it as soon as you remember on the same day and resume taking twice daily.  Do not take more than one dose of ELIQUIS at the same time to make up a missed dose.  Important Safety Information A possible side effect of Eliquis is bleeding. You should call your healthcare provider right away if you experience any of the following:   Bleeding from an injury or your nose that does not stop.   Unusual colored urine (red or dark brown) or unusual colored stools (red or black).   Unusual bruising for unknown reasons.   A serious fall or if you hit your head (even if there is no bleeding).  Some  medicines may interact with Eliquis and might increase your risk of bleeding or clotting while on Eliquis. To help avoid this, consult your healthcare provider or pharmacist prior to using any new prescription or non-prescription medications, including herbals, vitamins, non-steroidal anti-inflammatory drugs (NSAIDs) and supplements.  This website has more information on Eliquis (apixaban): http://www.eliquis.com/eliquis/home   Atrial Flutter Atrial flutter is a heart rhythm that can cause the heart to beat very fast (tachycardia). It originates in the upper chambers of the heart (atria). In atrial flutter, the top chambers of the heart (atria) often beat much faster than the bottom chambers of the heart (ventricles). Atrial flutter has a regular "saw toothed" appearance in an EKG readout. An EKG is a test that records the electrical activity of the heart. Atrial flutter can cause the heart to beat up to 150 beats per minute (BPM). Atrial flutter can either be short lived (paroxysmal) or permanent.  CAUSES  Causes of atrial flutter can be many. Some of these include:  Heart related issues:  Heart attack (myocardial infarction).  Heart failure.  Heart valve problems.  Poorly controlled high blood pressure (hypertension).  After open heart surgery.  Lung related issues:  A blood clot in the lungs (pulmonary embolism).  Chronic obstructive pulmonary disease (COPD). Medications used to treat COPD can attribute to atrial flutter.  Other related causes:  Hyperthyroidism.  Caffeine.  Some decongestant cold medications.  Low electrolyte levels such as  potassium or magnesium.  Cocaine. SYMPTOMS  An awareness of your heart beating rapidly (palpitations).  Shortness of breath.  Chest pain.  Low blood pressure (hypotension).  Dizziness or fainting. DIAGNOSIS  Different tests can be performed to diagnose atrial flutter.   An EKG.  Holter monitor. This is a 24-hour  recording of your heart rhythm. You will also be given a diary. Write down all symptoms that you have and what you were doing at the time you experienced symptoms.  Cardiac event monitor. This small device can be worn for up to 30 days. When you have heart symptoms, you will push a button on the device. This will then record your heart rhythm.  Echocardiogram. This is an imaging test to look at your heart. Your caregiver will look at your heart valves and the ventricles.  Stress test. This test can help determine if the atrial flutter is related to exercise or if coronary artery disease is present.  Laboratory studies will look at certain blood levels like:  Complete blood count (CBC).  Potassium.  Magnesium.  Thyroid function. TREATMENT  Treatment of atrial flutter varies. A combination of therapies may be used or sometimes atrial flutter may need only 1 type of treatment.  Lab work: If your blood work, such as your electrolytes (potassium, magnesium) or your thyroid function tests, are abnormal, your caregiver will treat them accordingly.  Medication:  There are several different types of medications that can convert your heart to a normal rhythm and prevent atrial flutter from reoccurring.  Nonsurgical procedures: Nonsurgical techniques may be used to control atrial flutter. Some examples include:  Cardioversion. This technique uses either drugs or an electrical shock to restore a normal heart rhythm:  Cardioversion drugs may be given through an intravenous (IV) line to help "reset" the heart rhythm.  In electrical cardioversion, your caregiver shocks your heart with electrical energy. This helps to reset the heartbeat to a normal rhythm.  Ablation. If atrial flutter is a persistent problem, an ablation may be needed. This procedure is done under mild sedation. High frequency radio-wave energy is used to destroy the area of heart tissue responsible for atrial flutter. SEEK  IMMEDIATE MEDICAL CARE IF:  You have:  Dizziness.  Near fainting or fainting.  Shortness of breath.  Chest pain or pressure.  Sudden nausea or vomiting.  Profuse sweating. If you have the above symptoms, call your local emergency service immediately! Do not drive yourself to the hospital. MAKE SURE YOU:   Understand these instructions.  Will watch your condition.  Will get help right away if you are not doing well or get worse. Document Released: 06/16/2008 Document Revised: 06/14/2013 Document Reviewed: 06/16/2008 [facility] Patient Information 2015 [place], [place]. This information is not intended to replace advice given to you by your health care provider. Make sure you discuss any questions you have with your health care provider.  No driving for [id number]. No lifting over 5 lbs for 1 week. No sexual activity for 1 week. Keep procedure site clean & dry. If you notice increased pain, swelling, bleeding or pus, call/return!  You may shower, but no soaking baths/hot tubs/pools for 1 week.

## 2013-10-14 NOTE — Progress Notes (Signed)
Patient c/o chest feeling tight with soreness noted when breathing. Patient rates pain at 3 of 10. Attempt to contact  Theodore Demark PA @ [phone] with no response. Will continue to contact the MD.

## 2013-10-14 NOTE — Discharge Summary (Signed)
ELECTROPHYSIOLOGY PROCEDURE DISCHARGE SUMMARY    Patient ID: Curtis Barton,  MRN: [id number], DOB/AGE: [birth year] 58 y.o.  Admit date: 10/12/2013 Discharge date: 10/14/2013  Primary Care Physician: Kaleen Mask, MD Primary Cardiologist: new to [facility] Electrophysiologist: Graciela Husbands  Primary Discharge Diagnosis:  Atrial flutter status post ablation this admission  Secondary Discharge Diagnosis:  1.  Hypertension 2.  Arthritis 3.  TIA  Allergies  Allergen Reactions  . Codeine Itching     Procedures This Admission: 1.  Echocardiogram 10-13-13 demonstrated EF 60-65%, no significant valvular abnormalities.  2.  Electrophysiology study and radiofrequency catheter ablation of atrial flutter on 10-13-13 by Dr Graciela Husbands.  This study demonstrated counterclockwise isthmus dependent atrial flutter that was successfully ablated along the cavotricuspid isthmus.  There were no early apparent complications.   Brief HPIHospital Course:  Curtis Barton is a 58 y.o. male with a past medical history as outlined above.  He underwent colonoscopy the day prior to admission.  The day of admission he developed palpitations as well as mild throat discomfort.  He presented to his PCP for evaluation where EKG demonstrated atrial flutter.  He was advised to be evaluated at [facility].  Echocardiogram demonstrated normal LV function.  He underwent flutter ablation on 10-13-13 which successfully terminated atrial flutter.  Heparin was transitioned to Eliquis prior to discharge.  Because of TIA symptoms, he will need a MRI of brain w/wo contrast as outpatient. We have attempted to obtain the MRI as inpatient, however his MRI keep getting pushed back due to availability. I have discussed with Dr. Graciela Husbands who is ok with him being discharge. His groin was without complication the day of discharge. He did have some mild chest discomfort this morning, however they were not associated with exertion or EKG changes and  quickly went away after sitting up. He was evaluated by Dr Graciela Husbands who considered him stable for discharge to home. I have placed a voice mail with our scheduler who will contact him regarding outpatient MRI of brain w/wo contrast.   Since he will be on eliquis, I have discontinued his ibuprofen as it can increase GI bleed. I have given him 1 month prescription of eliquis with free coupon. I will defer to Dr. Graciela Husbands as whether or not he will need eliquis after 1 month.    Discharge Vitals: Blood pressure 125/87, pulse 72, temperature 98.2 F (36.8 C), temperature source Oral, resp. rate 16, height  (1.93 m), weight 238 lb 1.6 oz (108 kg), SpO2 98.00%.  Labs:   Lab Results  Component Value Date   WBC 10.8* 10/14/2013   HGB 13.7 10/14/2013   HCT 40.4 10/14/2013   MCV 94.6 10/14/2013   PLT 152 10/14/2013     Recent Labs Lab 10/13/13 0650  NA 142  K 3.9  CL 107  CO2 25  BUN 7  CREATININE 0.89  CALCIUM 8.6  GLUCOSE 98    Discharge Medications:    Medication List    STOP taking these medications       ibuprofen 200 MG tablet  Commonly known as:  ADVIL,MOTRIN      TAKE these medications       apixaban 5 MG Tabs tablet  Commonly known as:  ELIQUIS  Take 1 tablet (5 mg total) by mouth 2 (two) times daily.     cyclobenzaprine 10 MG tablet  Commonly known as:  FLEXERIL  Take 10 mg by mouth 3 (three) times daily as needed for muscle  spasms.     hydrochlorothiazide 25 MG tablet  Commonly known as:  HYDRODIURIL  Take 25 mg by mouth daily.        Disposition:   Follow-up Information   Follow up with Sherryl Manges, MD On 11/15/2013. (9:30am)    Specialty:  Cardiology   Contact information:   [address] [place] [address] [phone]       Duration of Discharge Encounter: Greater than 30 minutes including physician time.  Ramond Dial PA Pager: [phone]

## 2013-10-14 NOTE — Progress Notes (Signed)
ANTICOAGULATION CONSULT NOTE  Pharmacy Consult for Heparin -> apixaban Indication: atrial fibrillation  Allergies  Allergen Reactions  . Codeine Itching    Labs:  Recent Labs  10/12/13 1925 10/12/13 2001 10/13/13 0650 10/13/13 1905 10/14/13 0125  HGB 16.5 17.3* 14.9  --  13.7  HCT 47.6 51.0 42.5  --  40.4  PLT 196  --  175  --  152  APTT  --   --  59*  --   --   HEPARINUNFRC  --   --  0.17* 0.25* 0.30  CREATININE  --  1.20 0.89  --   --     Estimated Creatinine Clearance: 123.4 ml/min (by C-G formula based on Cr of 0.89).  Assessment: 58 y.o. male presents with "heart fluttering" and weakness, found to be in afib. Pharmacy consulted to dose heparin and convert to apixaban in am of 9/3. Heparin level at goal on 1900 units/hr. No bleeding noted. Pt will not need further heparin levels with conversion to apixaban at 0800.  Goal of Therapy:  Heparin level 0.3-0.7 units/ml Monitor platelets by anticoagulation protocol: Yes   Plan:  1. Continue heparin at 1900 units / hr 2. Heparin to d/c at 8am and give first dose of apixaban 5 mg PO bid  Thank you for allowing pharmacy to be a part of this patients care team.  Christoper Fabian, PharmD, BCPS Clinical pharmacist, pager [phone] 10/14/2013 1:55 AM

## 2013-10-14 NOTE — Op Note (Signed)
NAMEJAMARIE, Curtis Barton NO.:  [id number]  MEDICAL RECORD NO.:  [id number]  LOCATION:  2C12C                        FACILITY:  [facility]  PHYSICIAN:  Duke Salvia, MD, FACCDATE OF BIRTH:  30-Sep-1955  DATE OF PROCEDURE:  10/13/2013 DATE OF DISCHARGE:                              OPERATIVE REPORT   PREOPERATIVE DIAGNOSIS:  Atrial flutter.  POSTOPERATIVE DIAGNOSIS:  Atrial flutter.  PROCEDURE:  Invasive electrophysiological study with arrhythmia mapping and catheter ablation.  DESCRIPTION OF PROCEDURE:  Following obtaining informed consent, the patient was brought to the Electrophysiology Laboratory and placed on the fluoroscopic table in supine position.  After routine prep and drape, cardiac catheterization was performed under the care of general anesthesia with Dr. Randa Evens.  Following the procedure, the catheters were removed.  The patient was transferred to the holding area for sheath removal in stable condition.  CATHETERS:  The 5-French quadripolar catheter was inserted via left femoral vein to the AV junction to measure the His electrogram.  A 7-French octapolar catheter was inserted via the left femoral vein to the coronary sinus.  A 6-French dual decapolar catheter was inserted via the left femoral vein to the tricuspid annulus.  An 8-French 10-mm deflectable tip ablation catheter was inserted via the SAFL sheath to mapping sites in the posterior septal space.  Surface leads 1, aVF, and V1 were monitored continuously throughout the procedure.  Following insertion of the catheters, a stimulation protocol included incremental atrial pacing. Incremental ventricular pacing. Single and double atrial extrastimuli, paced cycle length of 600 milliseconds.  END-TIDAL RESULTS:  End-tidal surface electrocardiogram and basic intervals.  Initial:  Rhythm:  Atrial flutter; RR interval 460 milliseconds; QRS duration 82 milliseconds; QT interval N/M; AH  interval N/A; HV:  42 milliseconds. AA interval was 227 milliseconds. Final:  Rhythm:  Sinus; RR interval 946 milliseconds; PR interval 174 milliseconds; P-wave duration 126 milliseconds; QRS duration 90 milliseconds; QT interval 405 milliseconds; AH interval 91 milliseconds; HV interval:  44 milliseconds.  AV NODAL FUNCTION:  AV Wenckebach occurred at 400 milliseconds. VA Wenckebach occurred at 280 milliseconds. The AV nodal effective refractory period at 600 milliseconds was demonstrated with double atrial extrastimuli at 300:310 milliseconds. No evidence of dual AV nodal physiology was identified.  ACCESSORY PATHWAY FUNCTION:  No evidence of an accessory pathway was identified.  VENTRICULAR RESPONSE TO PROGRAMMED STIMULATION:  Normal for ventricular stimulation as described.  IMPRESSION: 1. Normal sinus function. 2. Abnormal atrial function manifested by sustained atrial flutter,     for which, he underwent successful cavotricuspid isthmus catheter     ablation. 3. Normal atrioventricular nodal function. 4. Normal His-Purkinje system function. 5. No accessory pathway. 6. Normal ventricular response to programmed stimulation.  Ablation time:  A total of 18 minutes and 57 seconds of RF was applied and 15 applications across the cavotricuspid isthmus, initially resulted in termination of tachycardia and then intermittent conduction block and then permanent conduction block.  Fluoroscopy time:  A total of 13.3 minutes of fluoroscopy time was utilized.  SUMMARY:  In conclusion, the result of electrophysiological testing confirmed counterclockwise typical atrial flutter based on electrogram mapping.  Cavotricuspid isthmus ablation successfully terminated the tachycardia and then resulted  in bidirectional cavotricuspid isthmus conduction block.  The patient tolerated the procedure without apparent complication.  The patient will be maintained on heparin and begun on apixaban  later this evening.     Duke Salvia, MD, [facility]     SCK/MEDQ  D:  10/13/2013  T:  10/14/2013  Job:  [id number]

## 2013-10-14 NOTE — Progress Notes (Signed)
Biomedical scientist notified and made aware of inability to reach [facility] PA. Lucile Crater PA returned called and is busy with another patient. Hal paged per Annabelle Harman request Hal is on unit and will assess patient momentarily.

## 2013-10-14 NOTE — Progress Notes (Signed)
Cardizem gtt d/c          Jonetta Speak RN______________________________________________________________

## 2013-10-25 ENCOUNTER — Telehealth: Payer: Self-pay | Admitting: Internal Medicine

## 2013-10-25 DIAGNOSIS — G459 Transient cerebral ischemic attack, unspecified: Secondary | ICD-10-CM

## 2013-10-25 NOTE — Telephone Encounter (Signed)
Advised he will need orbital xray prior to MRI testing. Patient states he is aware and done this before. Order placed for xray

## 2013-10-25 NOTE — Telephone Encounter (Signed)
New Message   Pt called requests a call back. 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 Please call pt back to discuss further

## 2013-10-28 ENCOUNTER — Ambulatory Visit (HOSPITAL_COMMUNITY)
Admission: RE | Admit: 2013-10-28 | Discharge: 2013-10-28 | Disposition: A | Discharge status: Home / Self Care | Payer: BC Managed Care – PPO | Source: Ambulatory Visit | Attending: Internal Medicine | Admitting: Internal Medicine

## 2013-10-28 ENCOUNTER — Ambulatory Visit (HOSPITAL_COMMUNITY)
Admission: RE | Admit: 2013-10-28 | Discharge: 2013-10-28 | Disposition: A | Discharge status: Home / Self Care | Payer: BC Managed Care – PPO | Source: Ambulatory Visit | Attending: Physician Assistant | Admitting: Physician Assistant

## 2013-10-28 DIAGNOSIS — G459 Transient cerebral ischemic attack, unspecified: Secondary | ICD-10-CM | POA: Diagnosis not present

## 2013-10-28 MED ORDER — GADOBENATE DIMEGLUMINE 529 MG/ML IV SOLN
20.0000 mL | Freq: Once | INTRAVENOUS | Status: AC | PRN
  Administered 2013-10-28: 20 mL via INTRAVENOUS

## 2013-10-29 NOTE — Addendum Note (Signed)
Addendum created 10/29/13 1716 by Judie Petit, MD   Modules edited: Anesthesia Attestations

## 2013-11-03 ENCOUNTER — Encounter (HOSPITAL_COMMUNITY): Payer: Self-pay | Admitting: Physician Assistant

## 2013-11-03 DIAGNOSIS — G459 Transient cerebral ischemic attack, unspecified: Secondary | ICD-10-CM | POA: Diagnosis present

## 2013-11-03 DIAGNOSIS — I10 Essential (primary) hypertension: Secondary | ICD-10-CM | POA: Diagnosis present

## 2013-11-03 DIAGNOSIS — M1711 Unilateral primary osteoarthritis, right knee: Secondary | ICD-10-CM | POA: Diagnosis present

## 2013-11-03 DIAGNOSIS — M1990 Unspecified osteoarthritis, unspecified site: Secondary | ICD-10-CM | POA: Diagnosis present

## 2013-11-03 NOTE — H&P (Signed)
TOTAL KNEE ADMISSION H&P  Patient is being admitted for right total knee arthroplasty.  Subjective:  Chief Complaint:right knee pain.  HPI: Curtis Barton, 58 y.o. male, has a history of pain and functional disability in the right knee due to arthritis and has failed non-surgical conservative treatments for greater than 12 weeks to includeNSAID's and/or analgesics, corticosteriod injections, viscosupplementation injections, flexibility and strengthening excercises, supervised PT with diminished ADL's post treatment, use of assistive devices and activity modification.  Onset of symptoms was gradual, starting 10 years ago with gradually worsening course since that time. The patient noted prior procedures on the knee to include  arthroscopy, menisectomy and ACL reconstruction on the right knee(s).  Patient currently rates pain in the right knee(s) at 10 out of 10 with activity. Patient has night pain, worsening of pain with activity and weight bearing, pain that interferes with activities of daily living, crepitus and joint swelling.  Patient has evidence of subchondral sclerosis, periarticular osteophytes and joint space narrowing by imaging studies.. There is no active infection.  Patient Active Problem List   Diagnosis Date Noted  . Primary localized osteoarthritis of right knee 11/03/2013  . Hypertension   . Arthritis   . TIA (transient ischemic attack)   . Atrial flutter 10/12/2013   Past Medical History  Diagnosis Date  . Hypertension   . Arthritis   . TIA (transient ischemic attack)   . Atrial flutter   . Primary localized osteoarthritis of right knee   . Constipation     severe constipation after past surgeries    Past Surgical History  Procedure Laterality Date  . Hernia repair    . Tonsillectomy      No prescriptions prior to admission   Allergies  Allergen Reactions  . Codeine Itching    History  Substance Use Topics  . Smoking status: Former Smoker -- 1.00  packs/day    Types: Cigarettes    Quit date: 10/12/2013  . Smokeless tobacco: Not on file  . Alcohol Use: No    Family History  Problem Relation Age of Onset  . Heart disease    . Hypertension    . Cancer       Review of Systems  Constitutional: Negative.   HENT: Negative.   Eyes: Negative.   Respiratory: Negative.   Cardiovascular: Positive for palpitations. Negative for chest pain, orthopnea, claudication, leg swelling and PND.  Gastrointestinal: Negative.   Genitourinary: Negative.   Musculoskeletal: Positive for joint pain.  Skin: Negative.   Neurological: Negative.   Endo/Heme/Allergies: Negative.   Psychiatric/Behavioral: Negative.     Objective:  Physical Exam  Constitutional: He is oriented to person, place, and time. He appears well-developed and well-nourished.  HENT:  Head: Normocephalic and atraumatic.  Mouth/Throat: Oropharynx is clear and moist.  Eyes: Conjunctivae and EOM are normal. Pupils are equal, round, and reactive to light.  Neck: Neck supple.  Cardiovascular: Normal rate and regular rhythm.   Respiratory: Effort normal.  GI: Soft.  Genitourinary:  Not pertinent to current symptomatology therefore not examined.  Musculoskeletal:  Exam at this time shows a valgus inclination to the right knee.  Well healed central scar consistent with previous surgery.  Joint line tenderness is present laterally and somewhat medially.  Patellofemoral crepitus and pain is present as well.  Pulses are intact.  -5-120  Neurological: He is alert and oriented to person, place, and time.  Skin: Skin is warm and dry.  Psychiatric: He has a normal mood and affect.  Vital signs in last 24 hours: Temp:  [97.7 F (36.5 C)] 97.7 F (36.5 C) (09/23 0900) Pulse Rate:  [59] 59 (09/23 0900) BP: (151)/(100) 151/100 mmHg (09/23 0900) SpO2:  [97 %] 97 % (09/23 0900) Weight:  [112.492 kg (248 lb)] 112.492 kg (248 lb) (09/23 0900)  Labs:   Estimated body mass index is  30.2 kg/(m^2) as calculated from the following:   Height as of this encounter:  (1.93 m).   Weight as of this encounter: 112.492 kg (248 lb).   Imaging Review Plain radiographs demonstrate severe degenerative joint disease of the right knee(s). The overall alignment issignificant valgus. The bone quality appears to be good for age and reported activity level.  Assessment/Plan:  End stage arthritis, right knee   Principal Problem:   Primary localized osteoarthritis of right knee Active Problems:   Atrial flutter   Hypertension   Arthritis   TIA (transient ischemic attack)   The patient history, physical examination, clinical judgment of the provider and imaging studies are consistent with end stage degenerative joint disease of the right knee(s) and total knee arthroplasty is deemed medically necessary. The treatment options including medical management, injection therapy arthroscopy and arthroplasty were discussed at length. The risks and benefits of total knee arthroplasty were presented and reviewed. The risks due to aseptic loosening, infection, stiffness, patella tracking problems, thromboembolic complications and other imponderables were discussed. The patient acknowledged the explanation, agreed to proceed with the plan and consent was signed. Patient is being admitted for inpatient treatment for surgery, pain control, PT, OT, prophylactic antibiotics, VTE prophylaxis, progressive ambulation and ADL's and discharge planning. The patient is planning to be discharged home with home health services  This patient was admitted for symptomatic atrial flutter on 10-12-2013 by Dr Sherryl Manges.  He underwent cardiac ablation by Dr Graciela Husbands and was discharged on 10-14-2013.  He states that despite this episode and an MRI confirming TIAs that he has been cleared for surgery.  We will confirm this with Dr Graciela Husbands.  The patient has a follow up with Dr Graciela Husbands on 11-15-2013   Berdine Rasmusson A. Gwinda Passe Physician Assistant [facility] [phone]  11/03/2013, 10:59 AM

## 2013-11-11 ENCOUNTER — Encounter: Payer: Self-pay | Admitting: *Deleted

## 2013-11-15 ENCOUNTER — Encounter: Payer: Self-pay | Admitting: Internal Medicine

## 2013-11-15 ENCOUNTER — Ambulatory Visit (INDEPENDENT_AMBULATORY_CARE_PROVIDER_SITE_OTHER): Payer: BC Managed Care – PPO | Admitting: Internal Medicine

## 2013-11-15 VITALS — BP 132/90 | HR 65 | Ht 75.0 in | Wt 253.6 lb

## 2013-11-15 DIAGNOSIS — Z8679 Personal history of other diseases of the circulatory system: Secondary | ICD-10-CM

## 2013-11-15 DIAGNOSIS — I483 Typical atrial flutter: Secondary | ICD-10-CM

## 2013-11-15 DIAGNOSIS — Z01810 Encounter for preprocedural cardiovascular examination: Secondary | ICD-10-CM | POA: Insufficient documentation

## 2013-11-15 DIAGNOSIS — Z9889 Other specified postprocedural states: Secondary | ICD-10-CM

## 2013-11-15 DIAGNOSIS — I10 Essential (primary) hypertension: Secondary | ICD-10-CM

## 2013-11-15 DIAGNOSIS — Z87891 Personal history of nicotine dependence: Secondary | ICD-10-CM | POA: Insufficient documentation

## 2013-11-15 DIAGNOSIS — G459 Transient cerebral ischemic attack, unspecified: Secondary | ICD-10-CM

## 2013-11-15 MED ORDER — APIXABAN 5 MG PO TABS: 5.0000 mg | ORAL_TABLET | Freq: Two times a day (BID) | ORAL | Status: DC

## 2013-11-15 NOTE — Patient Instructions (Signed)
Your physician recommends that you continue on your current medications as directed. Please refer to the Current Medication list given to you today.  Your physician wants you to follow-up in: 6 months with Dr. Klein. You will receive a reminder letter in the mail two months in advance. If you don't receive a letter, please call our office to schedule the follow-up appointment.  

## 2013-11-15 NOTE — Progress Notes (Signed)
      Patient Care Team: Kaleen MaskWilson Hoelting Elkins, MD as PCP - General (Family Medicine)   HPI  Curtis Barton is a 58 y.o. male Seen in followup for atrial flutter for which he underwent catheter ablation while in hospital 9/15.   Echocardiogram at that time demonstrated normal left ventricular function.  CHADS-VASc score was 3 with hypertension and presumed TIAs    Anticipated knee replacement surgery. No functional limitations. Working aggressively as a farmer  No smoking in the last 5 weeks. As put on a few pounds  Past Medical History  Diagnosis Date  . Hypertension   . Arthritis   . TIA (transient ischemic attack)   . Atrial flutter   . Primary localized osteoarthritis of right knee   . Constipation     severe constipation after past surgeries    Past Surgical History  Procedure Laterality Date  . Hernia repair    . Tonsillectomy      Current Outpatient Prescriptions  Medication Sig Dispense Refill  . apixaban (ELIQUIS) 5 MG TABS tablet Take 1 tablet (5 mg total) by mouth 2 (two) times daily.  60 tablet  0  . cyclobenzaprine (FLEXERIL) 10 MG tablet Take 10 mg by mouth 3 (three) times daily as needed for muscle spasms.      . hydrochlorothiazide (HYDRODIURIL) 25 MG tablet Take 25 mg by mouth daily.       No current facility-administered medications for this visit.    Allergies  Allergen Reactions  . Codeine Itching    Review of Systems negative except from HPI and PMH  Physical Exam BP 132/90  Pulse 65  Ht 6\' 3"  (1.905 m)  Wt 253 lb 9.6 oz (115.032 kg)  BMI 31.70 kg/m2 Well developed and well nourished in no acute distress HENT normal E scleral and icterus clear Neck Supple JVP flat; carotids brisk and full Clear to ausculation  Regular rate and rhythm, no murmurs gallops or rub Soft with active bowel sounds No clubbing cyanosis  Edema Alert and oriented, grossly normal motor and sensory function Skin Warm and Dry    Assessment and   Plan  Atrial flutter  Hypertension  TIA--MRI neg  Preoper cardiovasc assessment  Prior smoker   great he has stopped smoking.  He will have to be conscious of his weight.  We'll have him talk to his PCP regarding a sleep study  Blood pressure target would be 120/80.   Need to stay on anticoagulation long-term with presumed TIA.  From a cardiovascular point of view he is at acceptable risk for surgery. His apixaban should be stopped about 24 hours prior to surgery. We will erefill apixoban

## 2013-11-16 ENCOUNTER — Ambulatory Visit: Payer: Self-pay | Admitting: Physician Assistant

## 2013-11-16 NOTE — Pre-Procedure Instructions (Signed)
Curtis Barton  11/16/2013   Your procedure is scheduled on:  Friday, October 16.              [facility]  [facility]10[facility].  Call this number if you have problems the morning of surgery: [phone]   Remember:   Do not eat food or drink liquids after midnight Thursday, October 15.   Take these medicines the morning of surgery with A SIP OF WATER: cyclobenzaprine (FLEXERIL) if needed.              Stop apixaban (ELIQUIS) 24 hours prior to surgery per your cardiologist.   Do not wear jewelry, make-up or nail polish.  Do not wear lotions, powders, or perfumes.               Men may shave face and neck.  Do not bring valuables to the hospital.              [facility] or [facility].               Contacts, dentures or bridgework may not be worn into surgery.  Leave suitcase in the car. After surgery it may be brought to your room.  For patients admitted to the hospital, discharge time is determined by your treatment team.               Patients discharged the day of surgery will not be allowed to drive home.  Name and phone number of your driver: -   Special Instructions: Review   - Preparing For Surgery.   Please read over the following fact sheets that you were given: Pain Booklet, Coughing and Deep Breathing and Blood Transfusion Information And Incentive Spirometry.

## 2013-11-17 ENCOUNTER — Encounter (HOSPITAL_COMMUNITY)
Admission: RE | Admit: 2013-11-17 | Discharge: 2013-11-17 | Disposition: A | Discharge status: Home / Self Care | Payer: BC Managed Care – PPO | Source: Ambulatory Visit | Attending: Orthopedic Surgery | Admitting: Orthopedic Surgery

## 2013-11-17 ENCOUNTER — Encounter (HOSPITAL_COMMUNITY): Payer: Self-pay

## 2013-11-17 ENCOUNTER — Telehealth: Payer: Self-pay | Admitting: *Deleted

## 2013-11-17 ENCOUNTER — Encounter (HOSPITAL_COMMUNITY)
Admission: RE | Admit: 2013-11-17 | Discharge: 2013-11-17 | Disposition: A | Discharge status: Home / Self Care | Payer: BC Managed Care – PPO | Source: Ambulatory Visit | Attending: Physician Assistant | Admitting: Physician Assistant

## 2013-11-17 DIAGNOSIS — Z87891 Personal history of nicotine dependence: Secondary | ICD-10-CM | POA: Insufficient documentation

## 2013-11-17 DIAGNOSIS — Z0000 Encounter for general adult medical examination without abnormal findings: Secondary | ICD-10-CM | POA: Insufficient documentation

## 2013-11-17 DIAGNOSIS — I4892 Unspecified atrial flutter: Secondary | ICD-10-CM | POA: Diagnosis not present

## 2013-11-17 DIAGNOSIS — M1711 Unilateral primary osteoarthritis, right knee: Secondary | ICD-10-CM | POA: Diagnosis not present

## 2013-11-17 DIAGNOSIS — Z8673 Personal history of transient ischemic attack (TIA), and cerebral infarction without residual deficits: Secondary | ICD-10-CM | POA: Diagnosis not present

## 2013-11-17 DIAGNOSIS — I10 Essential (primary) hypertension: Secondary | ICD-10-CM | POA: Diagnosis not present

## 2013-11-17 HISTORY — DX: Fuchs' heterochromic cyclitis, unspecified eye: H20.819

## 2013-11-17 HISTORY — DX: Unspecified asthma, uncomplicated: J45.909

## 2013-11-17 LAB — ABO/RH: ABO/RH(D): A NEG

## 2013-11-17 LAB — URINALYSIS, ROUTINE W REFLEX MICROSCOPIC
BILIRUBIN URINE: NEGATIVE
GLUCOSE, UA: NEGATIVE mg/dL
HGB URINE DIPSTICK: NEGATIVE
KETONES UR: NEGATIVE mg/dL
Leukocytes, UA: NEGATIVE
NITRITE: NEGATIVE
Protein, ur: NEGATIVE mg/dL
SPECIFIC GRAVITY, URINE: 1.004 — AB (ref 1.005–1.030)
Urobilinogen, UA: 0.2 mg/dL (ref 0.0–1.0)
pH: 6.5 (ref 5.0–8.0)

## 2013-11-17 LAB — TYPE AND SCREEN
ABO/RH(D): A NEG
Antibody Screen: NEGATIVE

## 2013-11-17 LAB — PROTIME-INR
INR: 1.15 (ref 0.00–1.49)
PROTHROMBIN TIME: 14.7 s (ref 11.6–15.2)

## 2013-11-17 LAB — COMPREHENSIVE METABOLIC PANEL
ALT: 19 U/L (ref 0–53)
AST: 22 U/L (ref 0–37)
Albumin: 3.7 g/dL (ref 3.5–5.2)
Alkaline Phosphatase: 81 U/L (ref 39–117)
Anion gap: 12 (ref 5–15)
BUN: 10 mg/dL (ref 6–23)
CALCIUM: 9.2 mg/dL (ref 8.4–10.5)
CO2: 25 mEq/L (ref 19–32)
Chloride: 99 mEq/L (ref 96–112)
Creatinine, Ser: 0.82 mg/dL (ref 0.50–1.35)
GFR calc Af Amer: >90 mL/min (ref >90)
GFR calc non Af Amer: >90 mL/min (ref >90)
Glucose, Bld: 93 mg/dL (ref 70–99)
Potassium: 4.3 mEq/L (ref 3.7–5.3)
Sodium: 136 mEq/L — ABNORMAL LOW (ref 137–147)
Total Bilirubin: 0.6 mg/dL (ref 0.3–1.2)
Total Protein: 7.6 g/dL (ref 6.0–8.3)

## 2013-11-17 LAB — CBC WITH DIFFERENTIAL/PLATELET
BASOS ABS: 0 10*3/uL (ref 0.0–0.1)
BASOS PCT: 0 % (ref 0–1)
EOS PCT: 3 % (ref 0–5)
Eosinophils Absolute: 0.3 10*3/uL (ref 0.0–0.7)
HEMATOCRIT: 44.3 % (ref 39.0–52.0)
HEMOGLOBIN: 15.5 g/dL (ref 13.0–17.0)
Lymphocytes Relative: 33 % (ref 12–46)
Lymphs Abs: 2.6 10*3/uL (ref 0.7–4.0)
MCH: 32.6 pg (ref 26.0–34.0)
MCHC: 35 g/dL (ref 30.0–36.0)
MCV: 93.3 fL (ref 78.0–100.0)
MONOS PCT: 8 % (ref 3–12)
Monocytes Absolute: 0.6 10*3/uL (ref 0.1–1.0)
Neutro Abs: 4.3 10*3/uL (ref 1.7–7.7)
Neutrophils Relative %: 56 % (ref 43–77)
Platelets: 181 10*3/uL (ref 150–400)
RBC: 4.75 MIL/uL (ref 4.22–5.81)
RDW: 12.7 % (ref 11.5–15.5)
WBC: 7.8 10*3/uL (ref 4.0–10.5)

## 2013-11-17 LAB — APTT: aPTT: 36 seconds (ref 24–37)

## 2013-11-17 LAB — SURGICAL PCR SCREEN
MRSA, PCR: NEGATIVE
Staphylococcus aureus: NEGATIVE

## 2013-11-17 NOTE — Progress Notes (Signed)
11/17/13 1110  OBSTRUCTIVE SLEEP APNEA  Have you ever been diagnosed with sleep apnea through a sleep study? No  Do you snore loudly (loud enough to be heard through closed doors)?  0  Do you often feel tired, fatigued, or sleepy during the daytime? 0  Has anyone observed you stop breathing during your sleep? 0  Do you have, or are you being treated for high blood pressure? 1  BMI more than 35 kg/m2? 0  Age over 57 years old? 1  Neck circumference greater than 40 cm/16 inches? 1  Gender: 1  Obstructive Sleep Apnea Score 4  Score 4 or greater  Results sent to PCP (Dr Andrey CampanileWilson Jeannetta NapElkins@ [facility])

## 2013-11-17 NOTE — Telephone Encounter (Signed)
11/15/13 - faxed medical/cardiac clearance for right total knee replacement. Clearance given to stop Eliquis 24 hour prior to procedure.

## 2013-11-18 LAB — URINE CULTURE
COLONY COUNT: NO GROWTH
CULTURE: NO GROWTH

## 2013-11-25 MED ORDER — CEFAZOLIN SODIUM-DEXTROSE 2-3 GM-% IV SOLR
2.0000 g | INTRAVENOUS | Status: AC
Start: 2013-11-26 — End: 2013-11-26
  Administered 2013-11-26: 2 g via INTRAVENOUS
  Filled 2013-11-25: qty 50

## 2013-11-25 MED ORDER — LACTATED RINGERS IV SOLN
INTRAVENOUS | Status: DC
  Administered 2013-11-26: 09:00:00 via INTRAVENOUS

## 2013-11-26 ENCOUNTER — Encounter (HOSPITAL_COMMUNITY): Payer: Self-pay | Admitting: *Deleted

## 2013-11-26 ENCOUNTER — Inpatient Hospital Stay (HOSPITAL_COMMUNITY): Payer: BC Managed Care – PPO | Admitting: Anesthesiology

## 2013-11-26 ENCOUNTER — Encounter (HOSPITAL_COMMUNITY): Payer: BC Managed Care – PPO | Admitting: Anesthesiology

## 2013-11-26 ENCOUNTER — Inpatient Hospital Stay (HOSPITAL_COMMUNITY)
Admission: RE | Admit: 2013-11-26 | Discharge: 2013-11-29 | DRG: 470 | Disposition: A | Discharge status: Home Health | Payer: BC Managed Care – PPO | Source: Ambulatory Visit | Attending: Orthopedic Surgery | Admitting: Orthopedic Surgery

## 2013-11-26 ENCOUNTER — Other Ambulatory Visit: Payer: Self-pay

## 2013-11-26 ENCOUNTER — Encounter (HOSPITAL_COMMUNITY)
Admission: RE | Disposition: A | Discharge status: Home Health | Payer: Self-pay | Source: Ambulatory Visit | Attending: Orthopedic Surgery

## 2013-11-26 DIAGNOSIS — I10 Essential (primary) hypertension: Secondary | ICD-10-CM | POA: Diagnosis present

## 2013-11-26 DIAGNOSIS — D62 Acute posthemorrhagic anemia: Secondary | ICD-10-CM | POA: Diagnosis not present

## 2013-11-26 DIAGNOSIS — M1990 Unspecified osteoarthritis, unspecified site: Secondary | ICD-10-CM | POA: Diagnosis present

## 2013-11-26 DIAGNOSIS — Z87891 Personal history of nicotine dependence: Secondary | ICD-10-CM

## 2013-11-26 DIAGNOSIS — Z8673 Personal history of transient ischemic attack (TIA), and cerebral infarction without residual deficits: Secondary | ICD-10-CM | POA: Diagnosis not present

## 2013-11-26 DIAGNOSIS — I4892 Unspecified atrial flutter: Secondary | ICD-10-CM | POA: Diagnosis present

## 2013-11-26 DIAGNOSIS — M1711 Unilateral primary osteoarthritis, right knee: Secondary | ICD-10-CM | POA: Diagnosis present

## 2013-11-26 DIAGNOSIS — G459 Transient cerebral ischemic attack, unspecified: Secondary | ICD-10-CM | POA: Diagnosis present

## 2013-11-26 DIAGNOSIS — M25561 Pain in right knee: Secondary | ICD-10-CM | POA: Diagnosis present

## 2013-11-26 DIAGNOSIS — M795 Residual foreign body in soft tissue: Secondary | ICD-10-CM | POA: Diagnosis present

## 2013-11-26 HISTORY — PX: TOTAL KNEE ARTHROPLASTY: SHX125

## 2013-11-26 HISTORY — DX: Unilateral primary osteoarthritis, right knee: M17.11

## 2013-11-26 SURGERY — ARTHROPLASTY, KNEE, TOTAL
Anesthesia: Regional | Site: Knee | Laterality: Right

## 2013-11-26 MED ORDER — MORPHINE SULFATE 2 MG/ML IJ SOLN
INTRAMUSCULAR | Status: AC
  Filled 2013-11-26: qty 1

## 2013-11-26 MED ORDER — ACETAMINOPHEN 500 MG PO TABS
1000.0000 mg | ORAL_TABLET | Freq: Four times a day (QID) | ORAL | Status: AC
  Administered 2013-11-26 – 2013-11-27 (×4): 1000 mg via ORAL
  Filled 2013-11-26 (×4): qty 2

## 2013-11-26 MED ORDER — CEFAZOLIN SODIUM-DEXTROSE 2-3 GM-% IV SOLR
2.0000 g | Freq: Four times a day (QID) | INTRAVENOUS | Status: AC
  Administered 2013-11-26 (×2): 2 g via INTRAVENOUS
  Filled 2013-11-26 (×2): qty 50

## 2013-11-26 MED ORDER — ACETAMINOPHEN 650 MG RE SUPP
650.0000 mg | Freq: Four times a day (QID) | RECTAL | Status: DC | PRN
Start: 2013-11-26 — End: 2013-11-29

## 2013-11-26 MED ORDER — HYDROMORPHONE HCL 1 MG/ML IJ SOLN
0.2500 mg | INTRAMUSCULAR | Status: DC | PRN
  Administered 2013-11-26 (×4): 0.5 mg via INTRAVENOUS

## 2013-11-26 MED ORDER — MENTHOL 3 MG MT LOZG
1.0000 | LOZENGE | OROMUCOSAL | Status: DC | PRN
  Filled 2013-11-26: qty 9

## 2013-11-26 MED ORDER — METOCLOPRAMIDE HCL 5 MG/ML IJ SOLN: 5.0000 mg | Freq: Three times a day (TID) | INTRAMUSCULAR | Status: DC | PRN

## 2013-11-26 MED ORDER — PHENYLEPHRINE HCL 10 MG/ML IJ SOLN
INTRAMUSCULAR | Status: DC | PRN
  Administered 2013-11-26 (×3): 80 ug via INTRAVENOUS

## 2013-11-26 MED ORDER — PHENYLEPHRINE 40 MCG/ML (10ML) SYRINGE FOR IV PUSH (FOR BLOOD PRESSURE SUPPORT)
PREFILLED_SYRINGE | INTRAVENOUS | Status: AC
  Filled 2013-11-26: qty 10

## 2013-11-26 MED ORDER — LIDOCAINE HCL (CARDIAC) 20 MG/ML IV SOLN
INTRAVENOUS | Status: AC
  Filled 2013-11-26: qty 5

## 2013-11-26 MED ORDER — HYDROMORPHONE HCL 1 MG/ML IJ SOLN
INTRAMUSCULAR | Status: AC
  Filled 2013-11-26: qty 1

## 2013-11-26 MED ORDER — TAPENTADOL HCL 50 MG PO TABS: ORAL_TABLET | ORAL | Status: DC

## 2013-11-26 MED ORDER — POVIDONE-IODINE 7.5 % EX SOLN: Freq: Once | CUTANEOUS | Status: DC

## 2013-11-26 MED ORDER — PROPOFOL 10 MG/ML IV BOLUS
INTRAVENOUS | Status: DC | PRN
  Administered 2013-11-26: 200 mg via INTRAVENOUS

## 2013-11-26 MED ORDER — MIDAZOLAM HCL 2 MG/2ML IJ SOLN
1.0000 mg | INTRAMUSCULAR | Status: DC | PRN
  Administered 2013-11-26: 2 mg via INTRAVENOUS

## 2013-11-26 MED ORDER — LACTATED RINGERS IV SOLN
INTRAVENOUS | Status: DC | PRN
  Administered 2013-11-26 (×2): via INTRAVENOUS

## 2013-11-26 MED ORDER — CHLORHEXIDINE GLUCONATE 4 % EX LIQD: 60.0000 mL | Freq: Once | CUTANEOUS | Status: DC

## 2013-11-26 MED ORDER — ONDANSETRON HCL 4 MG PO TABS: 4.0000 mg | ORAL_TABLET | Freq: Four times a day (QID) | ORAL | Status: DC | PRN

## 2013-11-26 MED ORDER — SODIUM CHLORIDE 0.9 % IR SOLN
Status: DC | PRN
  Administered 2013-11-26: 1000 mL

## 2013-11-26 MED ORDER — ONDANSETRON HCL 4 MG/2ML IJ SOLN
INTRAMUSCULAR | Status: DC | PRN
  Administered 2013-11-26: 4 mg via INTRAVENOUS

## 2013-11-26 MED ORDER — SODIUM CHLORIDE 0.9 % IV SOLN
INTRAVENOUS | Status: DC
  Administered 2013-11-26: 23:00:00 via INTRAVENOUS

## 2013-11-26 MED ORDER — HYDROCHLOROTHIAZIDE 25 MG PO TABS
25.0000 mg | ORAL_TABLET | Freq: Every day | ORAL | Status: DC
  Administered 2013-11-26 – 2013-11-29 (×4): 25 mg via ORAL
  Filled 2013-11-26 (×4): qty 1

## 2013-11-26 MED ORDER — METOCLOPRAMIDE HCL 10 MG PO TABS
5.0000 mg | ORAL_TABLET | Freq: Three times a day (TID) | ORAL | Status: DC | PRN
Start: 2013-11-26 — End: 2013-11-29

## 2013-11-26 MED ORDER — DIPHENHYDRAMINE HCL 50 MG/ML IJ SOLN
INTRAMUSCULAR | Status: AC
  Filled 2013-11-26: qty 1

## 2013-11-26 MED ORDER — ROPIVACAINE HCL 5 MG/ML IJ SOLN
INTRAMUSCULAR | Status: DC | PRN
  Administered 2013-11-26: 20 mL via PERINEURAL

## 2013-11-26 MED ORDER — ONDANSETRON HCL 4 MG/2ML IJ SOLN
INTRAMUSCULAR | Status: AC
  Filled 2013-11-26: qty 2

## 2013-11-26 MED ORDER — SODIUM CHLORIDE 0.9 % IR SOLN
Status: DC | PRN
  Administered 2013-11-26: 3000 mL

## 2013-11-26 MED ORDER — FENTANYL CITRATE 0.05 MG/ML IJ SOLN
50.0000 ug | INTRAMUSCULAR | Status: DC | PRN
  Administered 2013-11-26: 50 ug via INTRAVENOUS

## 2013-11-26 MED ORDER — GLYCOPYRROLATE 0.2 MG/ML IJ SOLN
INTRAMUSCULAR | Status: AC
  Filled 2013-11-26: qty 1

## 2013-11-26 MED ORDER — GLYCOPYRROLATE 0.2 MG/ML IJ SOLN
INTRAMUSCULAR | Status: DC | PRN
  Administered 2013-11-26: 0.2 mg via INTRAVENOUS

## 2013-11-26 MED ORDER — MIDAZOLAM HCL 2 MG/2ML IJ SOLN
INTRAMUSCULAR | Status: AC
  Filled 2013-11-26: qty 2

## 2013-11-26 MED ORDER — SENNOSIDES-DOCUSATE SODIUM 8.6-50 MG PO TABS
1.0000 | ORAL_TABLET | Freq: Every evening | ORAL | Status: DC | PRN
  Administered 2013-11-28: 1 via ORAL
  Filled 2013-11-26: qty 1

## 2013-11-26 MED ORDER — APIXABAN 5 MG PO TABS
5.0000 mg | ORAL_TABLET | Freq: Two times a day (BID) | ORAL | Status: DC
  Administered 2013-11-27 – 2013-11-29 (×5): 5 mg via ORAL
  Filled 2013-11-26 (×7): qty 1

## 2013-11-26 MED ORDER — PROPOFOL 10 MG/ML IV BOLUS
INTRAVENOUS | Status: AC
  Filled 2013-11-26: qty 20

## 2013-11-26 MED ORDER — ONDANSETRON HCL 4 MG/2ML IJ SOLN: 4.0000 mg | Freq: Four times a day (QID) | INTRAMUSCULAR | Status: DC | PRN

## 2013-11-26 MED ORDER — ACETAMINOPHEN 325 MG PO TABS: 650.0000 mg | ORAL_TABLET | Freq: Four times a day (QID) | ORAL | Status: DC | PRN

## 2013-11-26 MED ORDER — CHLORHEXIDINE GLUCONATE 4 % EX LIQD
60.0000 mL | Freq: Once | CUTANEOUS | Status: DC
Start: 2013-11-26 — End: 2013-11-26

## 2013-11-26 MED ORDER — FENTANYL CITRATE 0.05 MG/ML IJ SOLN
INTRAMUSCULAR | Status: AC
  Filled 2013-11-26: qty 5

## 2013-11-26 MED ORDER — PHENOL 1.4 % MT LIQD: 1.0000 | OROMUCOSAL | Status: DC | PRN

## 2013-11-26 MED ORDER — BISACODYL 10 MG RE SUPP: 10.0000 mg | Freq: Every day | RECTAL | Status: DC | PRN

## 2013-11-26 MED ORDER — DOCUSATE SODIUM 100 MG PO CAPS
100.0000 mg | ORAL_CAPSULE | Freq: Two times a day (BID) | ORAL | Status: DC
  Administered 2013-11-26 – 2013-11-29 (×6): 100 mg via ORAL
  Filled 2013-11-26 (×6): qty 1

## 2013-11-26 MED ORDER — FLEET ENEMA 7-19 GM/118ML RE ENEM: 1.0000 | ENEMA | Freq: Once | RECTAL | Status: AC | PRN

## 2013-11-26 MED ORDER — MIDAZOLAM HCL 2 MG/2ML IJ SOLN
INTRAMUSCULAR | Status: AC
  Administered 2013-11-26: 2 mg via INTRAVENOUS
  Filled 2013-11-26: qty 2

## 2013-11-26 MED ORDER — LIDOCAINE HCL (CARDIAC) 20 MG/ML IV SOLN
INTRAVENOUS | Status: DC | PRN
Start: 2013-11-26 — End: 2013-11-26
  Administered 2013-11-26: 80 mg via INTRAVENOUS

## 2013-11-26 MED ORDER — MORPHINE SULFATE 2 MG/ML IJ SOLN
2.0000 mg | INTRAMUSCULAR | Status: DC | PRN
  Administered 2013-11-26 – 2013-11-27 (×6): 2 mg via INTRAVENOUS
  Filled 2013-11-26 (×5): qty 1

## 2013-11-26 MED ORDER — CYCLOBENZAPRINE HCL 10 MG PO TABS
10.0000 mg | ORAL_TABLET | Freq: Three times a day (TID) | ORAL | Status: DC | PRN
  Administered 2013-11-26 – 2013-11-28 (×4): 10 mg via ORAL
  Filled 2013-11-26 (×4): qty 1

## 2013-11-26 MED ORDER — MIDAZOLAM HCL 5 MG/5ML IJ SOLN
INTRAMUSCULAR | Status: DC | PRN
  Administered 2013-11-26: 2 mg via INTRAVENOUS

## 2013-11-26 MED ORDER — TAPENTADOL HCL 50 MG PO TABS
50.0000 mg | ORAL_TABLET | ORAL | Status: DC | PRN
  Administered 2013-11-26: 100 mg via ORAL
  Administered 2013-11-26: 50 mg via ORAL
  Administered 2013-11-27 – 2013-11-29 (×9): 100 mg via ORAL
  Filled 2013-11-26: qty 1
  Filled 2013-11-26 (×10): qty 2

## 2013-11-26 MED ORDER — FENTANYL CITRATE 0.05 MG/ML IJ SOLN
INTRAMUSCULAR | Status: AC
  Administered 2013-11-26: 50 ug via INTRAVENOUS
  Filled 2013-11-26: qty 2

## 2013-11-26 MED ORDER — FENTANYL CITRATE 0.05 MG/ML IJ SOLN
INTRAMUSCULAR | Status: DC | PRN
  Administered 2013-11-26 (×5): 50 ug via INTRAVENOUS
  Administered 2013-11-26: 100 ug via INTRAVENOUS
  Administered 2013-11-26: 50 ug via INTRAVENOUS

## 2013-11-26 MED ORDER — DIPHENHYDRAMINE HCL 12.5 MG/5ML PO ELIX
12.5000 mg | ORAL_SOLUTION | ORAL | Status: DC | PRN
  Administered 2013-11-26 – 2013-11-28 (×5): 25 mg via ORAL
  Filled 2013-11-26 (×5): qty 10

## 2013-11-26 SURGICAL SUPPLY — 69 items
BANDAGE ELASTIC 4 VELCRO ST LF (GAUZE/BANDAGES/DRESSINGS) ×2 IMPLANT
BANDAGE ELASTIC 6 VELCRO ST LF (GAUZE/BANDAGES/DRESSINGS) ×2 IMPLANT
BANDAGE ESMARK 6X9 LF (GAUZE/BANDAGES/DRESSINGS) ×1 IMPLANT
BLADE SAGITTAL 25.0X1.19X90 (BLADE) ×2 IMPLANT
BLADE SAW SAG 90X13X1.27 (BLADE) ×2 IMPLANT
BLADE SURG 15 STRL LF DISP TIS (BLADE) IMPLANT
BLADE SURG 15 STRL SS (BLADE) ×2
BNDG CMPR 9X6 STRL LF SNTH (GAUZE/BANDAGES/DRESSINGS) ×1
BNDG ESMARK 6X9 LF (GAUZE/BANDAGES/DRESSINGS) ×2
BOWL SMART MIX CTS (DISPOSABLE) ×2 IMPLANT
CAPT RP KNEE ×1 IMPLANT
CEMENT HV SMART SET (Cement) ×2 IMPLANT
COVER SURGICAL LIGHT HANDLE (MISCELLANEOUS) ×2 IMPLANT
CUFF TOURNIQUET SINGLE 34IN LL (TOURNIQUET CUFF) ×2 IMPLANT
CUFF TOURNIQUET SINGLE 44IN (TOURNIQUET CUFF) IMPLANT
DRAPE INCISE IOBAN 66X45 STRL (DRAPES) IMPLANT
DRAPE ORTHO SPLIT 77X108 STRL (DRAPES) ×4
DRAPE SURG ORHT 6 SPLT 77X108 (DRAPES) ×2 IMPLANT
DRAPE U-SHAPE 47X51 STRL (DRAPES) ×2 IMPLANT
DRSG ADAPTIC 3X8 NADH LF (GAUZE/BANDAGES/DRESSINGS) ×2 IMPLANT
DRSG PAD ABDOMINAL 8X10 ST (GAUZE/BANDAGES/DRESSINGS) ×4 IMPLANT
DURAPREP 26ML APPLICATOR (WOUND CARE) ×3 IMPLANT
ELECT REM PT RETURN 9FT ADLT (ELECTROSURGICAL) ×2
ELECTRODE REM PT RTRN 9FT ADLT (ELECTROSURGICAL) ×1 IMPLANT
EVACUATOR 1/8 PVC DRAIN (DRAIN) ×2 IMPLANT
FACESHIELD WRAPAROUND (MASK) ×2 IMPLANT
FACESHIELD WRAPAROUND OR TEAM (MASK) ×2 IMPLANT
FLOSEAL 10ML (HEMOSTASIS) IMPLANT
GAUZE SPONGE 4X4 12PLY STRL (GAUZE/BANDAGES/DRESSINGS) ×2 IMPLANT
GLOVE BIOGEL PI IND STRL 8 (GLOVE) ×4 IMPLANT
GLOVE BIOGEL PI INDICATOR 8 (GLOVE) ×2
GLOVE ORTHO TXT STRL SZ7.5 (GLOVE) ×4 IMPLANT
GLOVE SURG ORTHO 8.0 STRL STRW (GLOVE) ×4 IMPLANT
GOWN STRL REUS W/ TWL LRG LVL3 (GOWN DISPOSABLE) ×2 IMPLANT
GOWN STRL REUS W/ TWL XL LVL3 (GOWN DISPOSABLE) ×1 IMPLANT
GOWN STRL REUS W/TWL 2XL LVL3 (GOWN DISPOSABLE) ×1 IMPLANT
GOWN STRL REUS W/TWL LRG LVL3 (GOWN DISPOSABLE) ×4
GOWN STRL REUS W/TWL XL LVL3 (GOWN DISPOSABLE) ×2
HANDPIECE INTERPULSE COAX TIP (DISPOSABLE) ×2
HOOD PEEL AWAY FACE SHEILD DIS (HOOD) ×2 IMPLANT
HOOD SURGICAL BLUE (PROTECTIVE WEAR) ×1 IMPLANT
IMMOBILIZER KNEE 22 UNIV (SOFTGOODS) IMPLANT
IMMOBILIZER KNEE 24 THIGH 36 (MISCELLANEOUS) IMPLANT
IMMOBILIZER KNEE 24 UNIV (MISCELLANEOUS) ×2
KIT BASIN OR (CUSTOM PROCEDURE TRAY) ×2 IMPLANT
KIT ROOM TURNOVER OR (KITS) ×2 IMPLANT
MANIFOLD NEPTUNE II (INSTRUMENTS) ×2 IMPLANT
NEEDLE 22X1 1/2 (OR ONLY) (NEEDLE) IMPLANT
NS IRRIG 1000ML POUR BTL (IV SOLUTION) ×2 IMPLANT
PACK TOTAL JOINT (CUSTOM PROCEDURE TRAY) ×2 IMPLANT
PAD ARMBOARD 7.5X6 YLW CONV (MISCELLANEOUS) ×3 IMPLANT
PAD CAST 4YDX4 CTTN HI CHSV (CAST SUPPLIES) ×1 IMPLANT
PADDING CAST COTTON 4X4 STRL (CAST SUPPLIES) ×2
PADDING CAST COTTON 6X4 STRL (CAST SUPPLIES) ×2 IMPLANT
SET HNDPC FAN SPRY TIP SCT (DISPOSABLE) ×1 IMPLANT
STAPLER VISISTAT 35W (STAPLE) ×2 IMPLANT
SUCTION FRAZIER TIP 10 FR DISP (SUCTIONS) ×2 IMPLANT
SUT ETHIBOND NAB CT1 #1 30IN (SUTURE) ×5 IMPLANT
SUT VIC AB 0 CT1 27 (SUTURE) ×2
SUT VIC AB 0 CT1 27XBRD ANBCTR (SUTURE) ×1 IMPLANT
SUT VIC AB 1 CT1 27 (SUTURE) ×4
SUT VIC AB 1 CT1 27XBRD ANBCTR (SUTURE) ×2 IMPLANT
SUT VIC AB 2-0 CT1 27 (SUTURE) ×4
SUT VIC AB 2-0 CT1 TAPERPNT 27 (SUTURE) ×2 IMPLANT
SYR CONTROL 10ML LL (SYRINGE) IMPLANT
TOWEL OR 17X24 6PK STRL BLUE (TOWEL DISPOSABLE) ×2 IMPLANT
TOWEL OR 17X26 10 PK STRL BLUE (TOWEL DISPOSABLE) ×2 IMPLANT
TRAY FOLEY CATH 16FRSI W/METER (SET/KITS/TRAYS/PACK) IMPLANT
WATER STERILE IRR 1000ML POUR (IV SOLUTION) ×2 IMPLANT

## 2013-11-26 NOTE — Discharge Instructions (Signed)
Diet: As you were doing prior to hospitalization  ° °Activity: Increase activity slowly as tolerated  °No lifting or driving for 6 weeks  ° °Shower: May shower without a dressing on post op day #5, NO SOAKING in tub  ° °Dressing: You may change your dressing on post op day #3.  °Then change the dressing daily with sterile 4"x4"s gauze dressing  °And TED hose for knees. Use paper tape to hold dressing in place  °For hips. You may clean the incision with alcohol prior to redressing.  ° °Weight Bearing: weight bearing as taught in physical therapy. Use a walker or  °Crutches as instructed.  ° °To prevent constipation: you may use a stool softener such as -  °Colace ( over the counter) 100 mg by mouth twice a day  °Drink plenty of fluids ( prune juice may be helpful) and high fiber foods  °Miralax ( over the counter) for constipation as needed.  ° °Precautions: If you experience chest pain or shortness of breath - call 911 immediately For transfer to the hospital emergency department!!  °If you develop a fever greater that 101 F, purulent drainage from wound, increased redness or drainage from wound, or calf pain -- Call the office  ° °Follow- Up Appointment: Please call for an appointment to be seen in 2 weeks  °[place] - [phone] ° °

## 2013-11-26 NOTE — Progress Notes (Signed)
Pt c/o severe itching from pain meds, no hives or rash. Dr Maple HudsonMoser notified. IV Benadryl ord and given. Will cont to monitor. O2 also humidified for pt comfort, nose itching.

## 2013-11-26 NOTE — Anesthesia Postprocedure Evaluation (Signed)
  Anesthesia Post-op Note  Patient: Curtis Barton  Procedure(s) Performed: Procedure(s): RIGHT TOTAL KNEE ARTHROPLASTY (Right)  Patient Location: PACU  Anesthesia Type:General and Regional  Level of Consciousness: awake  Airway and Oxygen Therapy: Patient Spontanous Breathing  Post-op Pain: moderate  Post-op Assessment: Post-op Vital signs reviewed, Patient's Cardiovascular Status Stable, Respiratory Function Stable, Patent Airway, No signs of Nausea or vomiting and Pain level controlled  Post-op Vital Signs: Reviewed and stable  Last Vitals:  Filed Vitals:   11/26/13 1400  BP: 151/86  Pulse: 67  Temp:   Resp: 13    Complications: No apparent anesthesia complications

## 2013-11-26 NOTE — Progress Notes (Signed)
Orthopedic Tech Progress Note Patient Details:  Curtis Barton 12/04/1955 [id number]   cpm right knee @0 -70 degrees footsie roll; trapeze bar patient helper   Nikki DomCrawford, Lexi Conaty 11/26/2013, 1:59 PM

## 2013-11-26 NOTE — Evaluation (Signed)
Physical Therapy Evaluation Patient Details Name: Curtis Barton MRN: [id number] DOB: 04-06-1955 Today's Date: 11/26/2013   History of Present Illness  Adm 11/26/13 for R TKA  PMHx- aflutter, TIA, HTN, Rt ACL repair  Clinical Impression  Pt is s/p TKA resulting in the deficits listed below (see PT Problem List).  Pt will benefit from skilled PT to increase their independence and safety with mobility to allow discharge to the venue listed below.      Follow Up Recommendations Home health PT;Supervision/Assistance - 24 hour    Equipment Recommendations  None recommended by PT    Recommendations for Other Services       Precautions / Restrictions Precautions Precautions: Knee;Fall Restrictions Weight Bearing Restrictions: Yes RLE Weight Bearing: Weight bearing as tolerated      Mobility  Bed Mobility Overal bed mobility: Needs Assistance Bed Mobility: Supine to Sit     Supine to sit: Min assist     General bed mobility comments: min assist to lift and bring RLE off EOB  Transfers Overall transfer level: Needs assistance Equipment used: Rolling walker (2 wheeled) Transfers: Sit to/from Stand Sit to Stand: Min assist;From elevated surface         General transfer comment: bed elevated to simulate height of bed at home; stood x 30 sec with reports of incr dizziness and sat back on bed; performed UE exercises for circulation with dizziness improving; stood again with transfer to chair  Ambulation/Gait Ambulation/Gait assistance: Min assist Ambulation Distance (Feet): 4 Feet Assistive device: Rolling walker (2 wheeled) Gait Pattern/deviations: Step-to pattern;Decreased step length - right;Decreased step length - left;Decreased weight shift to right;Antalgic     General Gait Details: pt with slight "buckling" at elbows when advancing LLE (due to incr pain in RLE); vc for proper use of RW and improved support via UEs  Stairs            Wheelchair Mobility     Modified Rankin (Stroke Patients Only)       Balance                                             Pertinent Vitals/Pain Pain Assessment: 0-10 Pain Score: 6  Pain Location: Rt knee Pain Intervention(s): Limited activity within patient's tolerance;Monitored during session;Repositioned    Home Living Family/patient expects to be discharged to:: Private residence Living Arrangements: Spouse/significant other Available Help at Discharge: Family;Available 24 hours/day Type of Home: House Home Access: Stairs to enter Entrance Stairs-Rails: None (can reach rail on deck (long arms)) Entrance Stairs-Number of Steps: 2, then 8" step from sunroom into den (vs 4 no rails at front) Home Layout: Two level Home Equipment: Environmental consultantWalker - 2 wheels      Prior Function Level of Independence: Independent               Hand Dominance        Extremity/Trunk Assessment   Upper Extremity Assessment: Overall WFL for tasks assessed           Lower Extremity Assessment: RLE deficits/detail RLE Deficits / Details: bulky bandage/difficult to fully assess post-op; quads 3-/5    Cervical / Trunk Assessment: Normal  Communication   Communication: No difficulties  Cognition Arousal/Alertness: Lethargic Behavior During Therapy: WFL for tasks assessed/performed Overall Cognitive Status: Within Functional Limits for tasks assessed  General Comments      Exercises        Assessment/Plan    PT Assessment Patient needs continued PT services  PT Diagnosis Difficulty walking;Acute pain   PT Problem List Decreased strength;Decreased range of motion;Decreased activity tolerance;Decreased balance;Decreased mobility;Decreased knowledge of use of DME;Decreased knowledge of precautions;Pain  PT Treatment Interventions DME instruction;Gait training;Stair training;Functional mobility training;Therapeutic activities;Therapeutic  exercise;Patient/family education   PT Goals (Current goals can be found in the Care Plan section) Acute Rehab PT Goals Patient Stated Goal: go home this weekend PT Goal Formulation: With patient Time For Goal Achievement: 11/30/13 Potential to Achieve Goals: Good    Frequency 7X/week   Barriers to discharge        Co-evaluation               End of Session Equipment Utilized During Treatment: Gait belt;Right knee immobilizer Activity Tolerance: Patient limited by lethargy;Other (comment) (limited by dizziness) Patient left: in chair;with call bell/phone within reach;with family/visitor present Nurse Communication: Mobility status         Time: [id number] PT Time Calculation (min): 30 min   Charges:   PT Evaluation $Initial PT Evaluation Tier I: 1 Procedure PT Treatments $Gait Training: 8-22 mins   PT G Codes:          Etola Mull 11/26/2013, 5:16 PM Pager [phone]

## 2013-11-26 NOTE — Interval H&P Note (Signed)
History and Physical Interval Note:  11/26/2013 7:27 AM  Curtis Barton  has presented today for surgery, with the diagnosis of OA RIGHT KNEE  The various methods of treatment have been discussed with the patient and family. After consideration of risks, benefits and other options for treatment, the patient has consented to  Procedure(s): RIGHT TOTAL KNEE ARTHROPLASTY (Right) as a surgical intervention .  The patient's history has been reviewed, patient examined, no change in status, stable for surgery.  I have reviewed the patient's chart and labs.  Questions were answered to the patient's satisfaction.     Altair Stanko JR,W D

## 2013-11-26 NOTE — Anesthesia Preprocedure Evaluation (Addendum)
Anesthesia Evaluation  Patient identified by MRN, date of birth, ID band Patient awake    Reviewed: Allergy & Precautions, H&P , NPO status , Patient's Chart, lab work & pertinent test results  Airway Mallampati: II TM Distance: <3 FB Neck ROM: Full    Dental  (+) Teeth Intact   Pulmonary neg shortness of breath, neg sleep apnea, neg COPDneg recent URI, former smoker,  breath sounds clear to auscultation        Cardiovascular hypertension, Pt. on medications - angina- Past MI + dysrhythmias Atrial Fibrillation Rhythm:Regular  S/p ablation   Neuro/Psych TIAnegative psych ROS   GI/Hepatic negative GI ROS, Neg liver ROS,   Endo/Other  Morbid obesity  Renal/GU negative Renal ROS     Musculoskeletal  (+) Arthritis -, Osteoarthritis,    Abdominal   Peds  Hematology eliquis last took 10/13   Anesthesia Other Findings   Reproductive/Obstetrics                         Anesthesia Physical Anesthesia Plan  ASA: II  Anesthesia Plan: General and Regional   Post-op Pain Management:    Induction: Intravenous  Airway Management Planned: LMA  Additional Equipment: None  Intra-op Plan:   Post-operative Plan: Extubation in OR  Informed Consent: I have reviewed the patients History and Physical, chart, labs and discussed the procedure including the risks, benefits and alternatives for the proposed anesthesia with the patient or authorized representative who has indicated his/her understanding and acceptance.   Dental advisory given  Plan Discussed with: CRNA and Surgeon  Anesthesia Plan Comments:        Anesthesia Quick Evaluation

## 2013-11-26 NOTE — Interval H&P Note (Signed)
History and Physical Interval Note:  11/26/2013 7:27 AM  Curtis Barton  has presented today for surgery, with the diagnosis of OA RIGHT KNEE  The various methods of treatment have been discussed with the patient and family. After consideration of risks, benefits and other options for treatment, the patient has consented to  Procedure(s): RIGHT TOTAL KNEE ARTHROPLASTY (Right) as a surgical intervention .  The patient's history has been reviewed, patient examined, no change in status, stable for surgery.  I have reviewed the patient's chart and labs.  Questions were answered to the patient's satisfaction.     Levander Katzenstein JR,W D   

## 2013-11-26 NOTE — Brief Op Note (Signed)
11/26/2013  1:24 PM  PATIENT:  Curtis Barton  58 y.o. male  PRE-OPERATIVE DIAGNOSIS:  OA RIGHT KNEE  POST-OPERATIVE DIAGNOSIS:  OA RIGHT KNEE  PROCEDURE:  Procedure(s): RIGHT TOTAL KNEE ARTHROPLASTY (Right)  SURGEON:  Surgeon(s) and Role:    * W D Carloyn Manneraffrey Jr., MD - Primary  PHYSICIAN ASSISTANT: Margart SicklesJoshua Consuela Widener, PA-C  ASSISTANTS:   ANESTHESIA:   regional and general  EBL:  Total I/O In: 1000 [I.V.:1000] Out: -   BLOOD ADMINISTERED:none  DRAINS: 1 hemovac drain lateral right knee self suction   LOCAL MEDICATIONS USED:  NONE  SPECIMEN:  No Specimen  DISPOSITION OF SPECIMEN:  N/A  COUNTS:  YES  TOURNIQUET:   Total Tourniquet Time Documented: Thigh (Right) - 85 minutes Total: Thigh (Right) - 85 minutes   DICTATION: .Other Dictation: Dictation Number unknown  PLAN OF CARE: Admit to inpatient   PATIENT DISPOSITION:  PACU - hemodynamically stable.   Delay start of Pharmacological VTE agent (>24hrs) due to surgical blood loss or risk of bleeding: yes

## 2013-11-26 NOTE — Transfer of Care (Signed)
Immediate Anesthesia Transfer of Care Note  Patient: Curtis Barton  Procedure(s) Performed: Procedure(s): RIGHT TOTAL KNEE ARTHROPLASTY (Right)  Patient Location: PACU  Anesthesia Type:General  Level of Consciousness: awake and alert   Airway & Oxygen Therapy: Patient Spontanous Breathing and Patient connected to nasal cannula oxygen  Post-op Assessment: Report given to PACU RN and Post -op Vital signs reviewed and stable  Post vital signs: Reviewed and stable  Complications: No apparent anesthesia complications

## 2013-11-26 NOTE — Anesthesia Procedure Notes (Addendum)
Anesthesia Regional Block:  Femoral nerve block  Pre-Anesthetic Checklist: ,, timeout performed, Correct Patient, Correct Site, Correct Laterality, Correct Procedure, Correct Position, site marked, Risks and benefits discussed,  Surgical consent,  Pre-op evaluation,  At surgeon's request and post-op pain management  Laterality: Lower and Right  Prep: chloraprep       Needles:  Injection technique: Single-shot  Needle Type: Echogenic Stimulator Needle          Additional Needles:  Procedures: ultrasound guided (picture in chart) Femoral nerve block  Nerve Stimulator or Paresthesia:  Response: quad, 0.4 mA,   Additional Responses:   Narrative:  Start time: 11/26/2013 10:19 AM End time: 11/26/2013 10:26 AM Injection made incrementally with aspirations every 5 mL.  Performed by: Personally  Anesthesiologist: Sonja Manseau  Additional Notes: H+P and labs reviewed, risks and benefits discussed with patient, procedure tolerated well without complications

## 2013-11-27 LAB — BASIC METABOLIC PANEL
Anion gap: 9 (ref 5–15)
BUN: 11 mg/dL (ref 6–23)
CHLORIDE: 98 meq/L (ref 96–112)
CO2: 28 meq/L (ref 19–32)
Calcium: 8.4 mg/dL (ref 8.4–10.5)
Creatinine, Ser: 0.94 mg/dL (ref 0.50–1.35)
GFR calc Af Amer: >90 mL/min (ref >90)
GFR calc non Af Amer: >90 mL/min (ref >90)
GLUCOSE: 107 mg/dL — AB (ref 70–99)
POTASSIUM: 4 meq/L (ref 3.7–5.3)
SODIUM: 135 meq/L — AB (ref 137–147)

## 2013-11-27 LAB — CBC
HCT: 32.1 % — ABNORMAL LOW (ref 39.0–52.0)
HEMOGLOBIN: 11 g/dL — AB (ref 13.0–17.0)
MCH: 31.9 pg (ref 26.0–34.0)
MCHC: 34.3 g/dL (ref 30.0–36.0)
MCV: 93 fL (ref 78.0–100.0)
Platelets: 155 10*3/uL (ref 150–400)
RBC: 3.45 MIL/uL — AB (ref 4.22–5.81)
RDW: 12.9 % (ref 11.5–15.5)
WBC: 9.2 10*3/uL (ref 4.0–10.5)

## 2013-11-27 NOTE — Evaluation (Signed)
Occupational Therapy Evaluation Patient Details Name: Curtis Barton MRN: [id number] DOB: October 30, 1955 Today's Date: 11/27/2013    History of Present Illness Adm 11/26/13 for R TKA  PMHx- aflutter, TIA, HTN, Rt ACL repair   Clinical Impression   Pt admitted with the above diagnoses and presents with below problem list. Pt will benefit from continued acute OT to address the below listed deficits and maximize independence with basic ADLs prior to d/c home. PTA pt was independent with ADLs. Pt currently at min guard level for LB ADLs and transfers. ADL education provided.      Follow Up Recommendations  Supervision/Assistance - 24 hour;No OT follow up    Equipment Recommendations  None recommended by OT;Other (comment) (pt has 3n1)    Recommendations for Other Services       Precautions / Restrictions Precautions Precautions: Knee;Fall Required Braces or Orthoses: Knee Immobilizer - Right Restrictions Weight Bearing Restrictions: Yes RLE Weight Bearing: Weight bearing as tolerated      Mobility Bed Mobility Overal bed mobility: Needs Assistance Bed Mobility: Sit to Supine     Supine to sit: Supervision Sit to supine: Supervision (cues to sequence)   General bed mobility comments: pt in recliner  Transfers Overall transfer level: Needs assistance Equipment used: Rolling walker (2 wheeled) Transfers: Sit to/from Stand Sit to Stand: Supervision Stand pivot transfers: Modified independent (Device/Increase time)       General transfer comment: Pt is able to mentally sequence transfers fine and get up alone    Balance Overall balance assessment: Needs assistance Sitting-balance support: No upper extremity supported;Feet supported Sitting balance-Leahy Scale: Good     Standing balance support: Bilateral upper extremity supported;During functional activity Standing balance-Leahy Scale: Poor                              ADL Overall ADL's : Needs  assistance/impaired Eating/Feeding: Set up;Sitting   Grooming: Set up;Sitting   Upper Body Bathing: Set up;Sitting   Lower Body Bathing: Min guard;With adaptive equipment;Sit to/from stand   Upper Body Dressing : Set up;Sitting   Lower Body Dressing: Min guard;With adaptive equipment;Sit to/from stand   Toilet Transfer: Min guard;Ambulation;RW;BSC   Toileting- ArchitectClothing Manipulation and Hygiene: Min guard;Sit to/from stand   Tub/ Shower Transfer: Min guard;Ambulation;3 in 1;Rolling walker   Functional mobility during ADLs: Min guard;Rolling walker General ADL Comments: ADL education provided to patient. Discussed use of reacher and sock aid.     Vision                     Perception     Praxis      Pertinent Vitals/Pain Pain Assessment: 0-10 Pain Score: 9  Pain Location: rt knee Pain Descriptors / Indicators: Aching;Sharp Pain Intervention(s): Limited activity within patient's tolerance;Monitored during session;RN gave pain meds during session;Repositioned     Hand Dominance     Extremity/Trunk Assessment Upper Extremity Assessment Upper Extremity Assessment: Overall WFL for tasks assessed   Lower Extremity Assessment Lower Extremity Assessment: Defer to PT evaluation   Cervical / Trunk Assessment Cervical / Trunk Assessment: Normal   Communication Communication Communication: No difficulties   Cognition Arousal/Alertness: Awake/alert Behavior During Therapy: WFL for tasks assessed/performed Overall Cognitive Status: Within Functional Limits for tasks assessed                     General Comments       Exercises  Shoulder Instructions      Home Living Family/patient expects to be discharged to:: Private residence Living Arrangements: Spouse/significant other Available Help at Discharge: Family;Available 24 hours/day Type of Home: House Home Access: Stairs to enter Entergy CorporationEntrance Stairs-Number of Steps: 2, then 8" step from sunroom  into Stryker Corporationden Entrance Stairs-Rails: None Home Layout: Two level Alternate Level Stairs-Number of Steps: 1 Alternate Level Stairs-Rails: None Bathroom Shower/Tub: Chief Strategy OfficerTub/shower unit   Bathroom Toilet: Standard     Home Equipment: Environmental consultantWalker - 2 wheels;Bedside commode          Prior Functioning/Environment Level of Independence: Independent        Comments: works as a Lexicographerfarmer    OT Diagnosis: Acute pain   OT Problem List: Impaired balance (sitting and/or standing);Decreased knowledge of use of DME or AE;Decreased knowledge of precautions;Pain   OT Treatment/Interventions: Self-care/ADL training;DME and/or AE instruction;Therapeutic activities;Patient/family education;Balance training    OT Goals(Current goals can be found in the care plan section) Acute Rehab OT Goals Patient Stated Goal: play golf, get in and out of tractor, return to work OT Goal Formulation: With patient Time For Goal Achievement: 12/04/13 Potential to Achieve Goals: Good ADL Goals Pt Will Perform Lower Body Dressing: with modified independence;with adaptive equipment;sit to/from stand Pt Will Transfer to Toilet: with modified independence;ambulating (3n1 over toilet) Pt Will Perform Toileting - Clothing Manipulation and hygiene: sit to/from stand;with modified independence Pt Will Perform Tub/Shower Transfer: with supervision;ambulating;3 in 1;rolling walker  OT Frequency: Min 2X/week   Barriers to D/C:            Co-evaluation              End of Session Equipment Utilized During Treatment: Gait belt;Rolling walker;Right knee immobilizer  Activity Tolerance: Patient tolerated treatment well;Patient limited by pain Patient left: in bed;with call bell/phone within reach;Other (comment) (in zero knee)   Time: 1610-96041149-1210 OT Time Calculation (min): 21 min Charges:  OT General Charges $OT Visit: 1 Procedure OT Evaluation $Initial OT Evaluation Tier I: 1 Procedure OT Treatments $Self Care/Home  Management : 8-22 mins G-Codes:    Pilar GrammesMathews, Loghan Kurtzman H 11/27/2013, 12:49 PM

## 2013-11-27 NOTE — Progress Notes (Signed)
[facility] Progress Note Weekend Coverage  Subjective  Doing well Pain tolerable Has worked with PT today Has not been in CPM yet today, was in it for 6 hours last night + flatus No bm Voiding w/o difficulty   A little nervous about dc to home.  At this moment feels he cannot maneuver well at home   Review of Systems  Constitutional: Negative for fever and chills.  Respiratory: Negative for shortness of breath and wheezing.   Cardiovascular: Negative for chest pain and palpitations.  Gastrointestinal: Negative for nausea, vomiting and abdominal pain.  Genitourinary: Negative for dysuria.  Neurological: Negative for tingling and sensory change.     Objective   BP 110/78  Pulse 67  Temp(Src) 98.1 F (36.7 C) (Oral)  Resp 16  Ht 6\' 4"  (1.93 m)  Wt 114.76 kg (253 lb)  BMI 30.81 kg/m2  SpO2 94%  Intake/Output     10/16 0701 - 10/17 0700 10/17 0701 - 10/18 0700   P.O. 870    I.V. (mL/kg) 1520 (13.2) 25 (0.2)   Total Intake(mL/kg) 2390 (20.8) 25 (0.2)   Urine (mL/kg/hr) 2700    Drains 595    Blood 25    Total Output 3320     Net -930 +25        Urine Occurrence 1 x      Labs  Results for Corrinne EagleOLIVER, Ronelle H (MRN [id number]) as of 11/27/2013 11:49  Ref. Range 11/27/2013 03:51  Sodium Latest Range: 137-147 mEq/L 135 (L)  Potassium Latest Range: 3.7-5.3 mEq/L 4.0  Chloride Latest Range: 96-112 mEq/L 98  CO2 Latest Range: 19-32 mEq/L 28  BUN Latest Range: 6-23 mg/dL 11  Creatinine Latest Range: 0.50-1.35 mg/dL 4.090.94  Calcium Latest Range: 8.4-10.5 mg/dL 8.4  GFR calc non Af Amer Latest Range: >90 mL/min >90  GFR calc Af Amer Latest Range: >90 mL/min >90  Glucose Latest Range: 70-99 mg/dL 811107 (H)  Anion gap Latest Range: 5-15  9  WBC Latest Range: 4.0-10.5 K/uL 9.2  RBC Latest Range: 4.22-5.81 MIL/uL 3.45 (L)  Hemoglobin Latest Range: 13.0-17.0 g/dL 91.411.0 (L)  HCT Latest Range: 39.0-52.0 % 32.1 (L)  MCV Latest Range: 78.0-100.0 fL 93.0  MCH  Latest Range: 26.0-34.0 pg 31.9  MCHC Latest Range: 30.0-36.0 g/dL 78.234.3  RDW Latest Range: 11.5-15.5 % 12.9  Platelets Latest Range: 150-400 K/uL 155    Exam  Gen: awake and alert, NAD, comfortable appearing Lungs: clear anterior fields Cardiac: RRR, s1 and s2 Abd: + BS, NTND Ext:       Right Lower Extremity   Dressing c/d/i  Ext warm  + DP pulse  Compartments soft, no pain with passive stretch   DPN, SPN, TN sensation intact  EHL, FHL, AT, PT, Peroneals, gastroc motor intact  No dct  Place pt in zero knee bone foam     Assessment and Plan   POD/HD#: 1   58 y/o white male s/p R TKA   1. 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 Pain management:  Nucynta appears to be effective  Continue with current regimen  3. ABL anemia/Hemodynamics  Stable  Monitor  CBC in am   4. Medical issues   Stable  Home meds restarted   5. DVT/PE prophylaxis:  Lovenox  6. ID:   periop abx protocol  7. Activity:  Up as tolerated  WBAT   PT/O  8. FEN/Foley/Lines:  Diet as tolerated  Continue with IVF  9. Dispo:  Continue with inpatient care  Will see how pt feels tomorrow to determine if pt needs SNF    Mearl LatinKeith W. Miana Politte, PA-C [facility] [phone](P) [phone] (O) 11/27/2013 11:48 AM

## 2013-11-27 NOTE — Progress Notes (Signed)
Physical Therapy Treatment Patient Details Name: Curtis EagleShannon H Barton MRN: [id number] DOB: 03-Aug-1955 Today's Date: 11/27/2013    History of Present Illness Adm 11/26/13 for R TKA  PMHx- aflutter, TIA, HTN, Rt ACL repair    PT Comments    Pt was able to climb stairs after a very challenging day yesterday.  Has better R knee control and instructed use of immobilizer for home.  Planning to DC soon but pt reluctant to go today.  Continue BID.  Follow Up Recommendations  Home health PT;Supervision/Assistance - 24 hour     Equipment Recommendations  None recommended by PT    Recommendations for Other Services       Precautions / Restrictions Precautions Precautions: Knee;Fall Required Braces or Orthoses: Knee Immobilizer - Right Restrictions Weight Bearing Restrictions: Yes RLE Weight Bearing: Weight bearing as tolerated    Mobility  Bed Mobility Overal bed mobility: Needs Assistance Bed Mobility: Sit to Supine     Supine to sit: Supervision Sit to supine: Supervision (cues to sequence)   General bed mobility comments: Using no rail to mimic home situation and elevated bed to [place] bed size at home  Transfers Overall transfer level: Modified independent Equipment used: Rolling walker (2 wheeled) Transfers: Sit to/from RaytheonStand;Stand Pivot Transfers Sit to Stand: Modified independent (Device/Increase time) Stand pivot transfers: Modified independent (Device/Increase time)       General transfer comment: Pt is able to mentally sequence transfers fine and get up alone  Ambulation/Gait Ambulation/Gait assistance: Supervision Ambulation Distance (Feet): 200 Feet Assistive device: Rolling walker (2 wheeled) Gait Pattern/deviations: Step-through pattern;Decreased stride length;Ataxic;Wide base of support;Trunk flexed Gait velocity: reduced Gait velocity interpretation: Below normal speed for age/gender General Gait Details: paced and could use RW to manage pain before  stairs   Stairs Stairs: Yes Stairs assistance: Supervision Stair Management: Two rails Number of Stairs: 20 General stair comments: used railing and sequence as instructed for immob with elbows on rails at times  Wheelchair Mobility    Modified Rankin (Stroke Patients Only)       Balance                                    Cognition Arousal/Alertness: Awake/alert Behavior During Therapy: WFL for tasks assessed/performed Overall Cognitive Status: Within Functional Limits for tasks assessed                      Exercises Total Joint Exercises Heel Slides: AROM;Both;10 reps Hip ABduction/ADduction: AROM;Both;10 reps Straight Leg Raises: AAROM;Right;10 reps    General Comments General comments (skin integrity, edema, etc.): Drain in place R knee with conveniently applied immobilizer.  Pt is motivated and minimal R knee edema      Pertinent Vitals/Pain Pain Assessment: 0-10 Pain Score: 7  Pain Location: Rt knee Pain Intervention(s): Limited activity within patient's tolerance;Monitored during session;Patient requesting pain meds-RN notified;Repositioned BP was 110/78, pulse 67 and O2 sat 94% with nsg report.    Home Living                      Prior Function            PT Goals (current goals can now be found in the care plan section) Acute Rehab PT Goals Patient Stated Goal: go home this weekend Progress towards PT goals: Progressing toward goals    Frequency  7X/week    PT Plan Current  plan remains appropriate    Co-evaluation             End of Session Equipment Utilized During Treatment: Gait belt;Right knee immobilizer Activity Tolerance: Patient tolerated treatment well Patient left: in bed;with call bell/phone within reach;with nursing/sitter in room     Time: 1035-1105 PT Time Calculation (min): 30 min  Charges:  $Gait Training: 8-22 mins $Therapeutic Exercise: 8-22 mins                    G Codes:       Ivar DrapeStout, Willam Munford E 11/27/2013, 11:15 AM Samul Dadauth Avaline Stillson, PT MS Acute Rehab Dept. Number: [phone]

## 2013-11-27 NOTE — Op Note (Signed)
NAMMarland Kitchen:  Curtis Barton, Curtis Barton              ACCOUNT NO.:  [id number]  MEDICAL RECORD NO.:  [id number]  LOCATION:  [id number]                        FACILITY:  [facility]  PHYSICIAN:  Dyke BrackettW. D. Jarett Dralle, M.D.    DATE OF BIRTH:  [birth year]  DATE OF PROCEDURE: DATE OF DISCHARGE:                              OPERATIVE REPORT   PREOPERATIVE DIAGNOSIS:  Severe osteoarthritis, right knee with retained hardware.  POSTOPERATIVE DIAGNOSIS:  Severe osteoarthritis, right knee with retained hardware.  OPERATION: 1. Right total knee replacement (Sigma cemented size 6 tibia, size 5     tibia, with 10 mm polyethylene insert and 41 mm all poly patella). 2. Removal of retained hardware (staple x2) off of the right knee.  SURGEON:  Dyke BrackettW. D. Lorilyn Laitinen, MD  ASSISTANVincent Peyer:  [place], PA  TOURNIQUET TIME:  Eighty minutes.  DESCRIPTION OF PROCEDURE:  Sterile prep and drape, exsanguination leg, inflation leg to 350.  We used the previous old incision, enlarged it distally and proximally.  Performed a medial parapatellar approach to the knee with identification of retained hardware particularly staples, just medial to the tibial tubercle.  We removed these staples  later, there was a small bone defect created by these on the metaphysis and plugged this somewhat with a cement and we cemented the knee in, stripped the medial side of the knee, then identified the most diseased actually lateral compartment cutting initially about 2 mm below that with provisional cut to obtain full extension.  The patient had a moderate flexion contracture.  We resected 11 and then 12 mm off the femur again to obtain full extension, sized to be a size 6 tibia.  We then performed the anterior and posterior chamfer cuts with anterior- posterior cuts with appropriate deep external rotation.  Excess menisci released.  The PCL was carried out, stripping the posterior aspect of the knee.  We then cut a keel for the size 5 tibia, placed the trial tibia  followed by the box cut on the femur.  Trial femur and tibia were placed, corrected nearly full extension possibly 1-2 degrees remaining, but thought this was acceptable.  We cut the patella leaving about 14 mm of native patella for 41 mm all poly patellar trial and a trial tibia with a 10 mm bearing, flexion to 120 was noted nearly full extension, excellent balancing was noted as well medial and laterally.  The trials were removed.  The bony surfaces were irrigated.  We then inserted the component tibia followed by femur patella.  We elected to use the trial bearing.  Cement was allowed to harden.  Excess cement was removed from the knee.  The bearing was removed, checked for excess cement, some was noted and removed.  Tourniquet was released.  No excessive bleeding was noted. Hemovac drain was placed exiting superolaterally.  Closure was affected with #1 Ethibond, 2-0 Vicryl, and staples on the skin.  Light compressive sterile dressing, knee immobilizer were applied, taken to recovery room in stable condition.     Dyke BrackettW. D. Logyn Dedominicis, M.D.     WDC/MEDQ  D:  11/26/2013  T:  11/27/2013  Job:  [id number]

## 2013-11-27 NOTE — Progress Notes (Signed)
Physical Therapy Treatment Patient Details Name: Curtis Barton MRN: [id number] DOB: 1955/06/03 Today's Date: 11/27/2013    History of Present Illness Adm 11/26/13 for R TKA  PMHx- aflutter, TIA, HTN, Rt ACL repair    PT Comments    Pt and family were present for visit, pt able to increase gait distance and get farther with mobiltiy.  Discussed starting to use RLE more for lowering to sitting as he is now permitted to walk without brace.  Very motivated and taking time for learning his exercises and increasing time up OOB today.  Continue tomorrow am with stairs and then likely to DC.  Follow Up Recommendations  Home health PT;Supervision/Assistance - 24 hour     Equipment Recommendations  None recommended by PT    Recommendations for Other Services       Precautions / Restrictions Precautions Precautions: Knee;Fall Required Braces or Orthoses:  (discontinued immobilizer) Knee Immobilizer - Right: Discontinue once straight leg raise with < 10 degree lag;Other (comment) (reached goal) Restrictions Weight Bearing Restrictions: Yes RLE Weight Bearing: Weight bearing as tolerated    Mobility  Bed Mobility                  Transfers Overall transfer level: Needs assistance Equipment used: Rolling walker (2 wheeled) Transfers: Sit to/from BJ'sStand;Stand Pivot Transfers Sit to Stand: Min assist Stand pivot transfers: Min guard       General transfer comment: Tired this afternoon  Ambulation/Gait Ambulation/Gait assistance: Supervision Ambulation Distance (Feet): 250 Feet Assistive device: Rolling walker (2 wheeled) Gait Pattern/deviations: Step-through pattern;Narrow base of support Gait velocity: reduced Gait velocity interpretation: Below normal speed for age/gender General Gait Details: took several short rests due to walking without brace   Stairs            Wheelchair Mobility    Modified Rankin (Stroke Patients Only)       Balance Overall  balance assessment: Modified Independent Sitting-balance support: Feet supported Sitting balance-Leahy Scale: Good     Standing balance support: Bilateral upper extremity supported Standing balance-Leahy Scale: Fair                      Cognition Arousal/Alertness: Awake/alert Behavior During Therapy: WFL for tasks assessed/performed Overall Cognitive Status: Within Functional Limits for tasks assessed                      Exercises Total Joint Exercises Ankle Circles/Pumps: Both;5 reps;AROM Heel Slides: Right;Strengthening;10 reps Straight Leg Raises: AROM;Both;10 reps Long Arc Quad: Strengthening;Right;10 reps    General Comments General comments (skin integrity, edema, etc.): Drain is in place and no signs of leaking, pt with good condition of skin around his ace wrap      Pertinent Vitals/Pain Pain Assessment: 0-10 Pain Score: 6  Pain Location: R knee Pain Intervention(s): Limited activity within patient's tolerance;Monitored during session;Premedicated before session    Home Living                      Prior Function            PT Goals (current goals can now be found in the care plan section) Acute Rehab PT Goals Patient Stated Goal: get home tomorrow Progress towards PT goals: Progressing toward goals    Frequency  7X/week    PT Plan Current plan remains appropriate    Co-evaluation             End of  Session Equipment Utilized During Treatment: Gait belt Activity Tolerance: Patient tolerated treatment well Patient left: in chair;with call bell/phone within reach;with family/visitor present     Time: 1610-96041615-1628 (plus 1715 to 1735) PT Time Calculation (min): 13 min  Charges:  $Gait Training: 8-22 mins $Therapeutic Exercise: 8-22 mins                    G Codes:      Ivar DrapeStout, Denetra Formoso E 11/27/2013, 5:56 PM Samul Dadauth Beya Tipps, PT MS Acute Rehab Dept. Number: [phone]

## 2013-11-27 NOTE — Plan of Care (Signed)
Problem: Consults Goal: Diagnosis- Total Joint Replacement Outcome: Completed/Met Date Met:  11/27/13 Primary Total Knee Right

## 2013-11-28 LAB — CBC
HEMATOCRIT: 29.3 % — AB (ref 39.0–52.0)
HEMOGLOBIN: 10.3 g/dL — AB (ref 13.0–17.0)
MCH: 32.3 pg (ref 26.0–34.0)
MCHC: 35.2 g/dL (ref 30.0–36.0)
MCV: 91.8 fL (ref 78.0–100.0)
Platelets: 155 10*3/uL (ref 150–400)
RBC: 3.19 MIL/uL — AB (ref 4.22–5.81)
RDW: 12.8 % (ref 11.5–15.5)
WBC: 9.1 10*3/uL (ref 4.0–10.5)

## 2013-11-28 NOTE — Progress Notes (Signed)
[facility] Progress Note Weekend Coverage  Subjective  Doing well No specific complaints   Review of Systems  Constitutional: Negative for fever and chills.  Respiratory: Negative for shortness of breath and wheezing.   Cardiovascular: Negative for chest pain and palpitations.  Gastrointestinal: Positive for constipation. Negative for nausea, vomiting and abdominal pain.  Genitourinary: Negative for dysuria.  Neurological: Negative for tingling, sensory change and headaches.     Objective   BP 124/76  Pulse 82  Temp(Src) 98.1 F (36.7 C) (Oral)  Resp 18  Ht 6\' 4"  (1.93 m)  Wt 114.76 kg (253 lb)  BMI 30.81 kg/m2  SpO2 95%  Intake/Output     10/17 0701 - 10/18 0700 10/18 0701 - 10/19 0700   P.O. 720    I.V. (mL/kg) 25 (0.2)    Total Intake(mL/kg) 745 (6.5)    Urine (mL/kg/hr)     Drains 75 (0) 75 (0.1)   Blood     Total Output 75 75   Net +670 -75        Urine Occurrence 5 x      Labs Results for Corrinne EagleOLIVER, Trea H (MRN [id number]) as of 11/28/2013 11:26  Ref. Range 11/28/2013 04:20  WBC Latest Range: 4.0-10.5 K/uL 9.1  RBC Latest Range: 4.22-5.81 MIL/uL 3.19 (L)  Hemoglobin Latest Range: 13.0-17.0 g/dL 40.910.3 (L)  HCT Latest Range: 39.0-52.0 % 29.3 (L)  MCV Latest Range: 78.0-100.0 fL 91.8  MCH Latest Range: 26.0-34.0 pg 32.3  MCHC Latest Range: 30.0-36.0 g/dL 81.135.2  RDW Latest Range: 11.5-15.5 % 12.8  Platelets Latest Range: 150-400 K/uL 155     Exam  Gen: awake and alert, NAD, comfortable appearing, in CPM  Lungs: clear anterior fields Cardiac: RRR, s1 and s2 Abd: + BS, NTND Ext:        Right Lower Extremity               incision looks fantastic   No signs of infection  Drain removed              Ext warm             + DP pulse             Compartments soft, no pain with passive stretch               DPN, SPN, TN sensation intact             EHL, FHL, AT, PT, Peroneals, gastroc motor intact             No dct     Assessment  and Plan   POD/HD#: 2   58 y/o white male s/p R TKA   1. 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 Pain management:             Nucynta appears to be effective  Continue with current regimen  3. ABL anemia/Hemodynamics             Stable             Monitor             CBC in am   4. Medical issues               Stable             Home meds  5. DVT/PE prophylaxis:             Lovenox  6. ID:               periop abx protocol  7. Activity:             Up as tolerated             WBAT               PT/O  8. FEN/Foley/Lines:             Diet as tolerated             KVO  9. Dispo:             Continue with inpatient care             dc home tomorrow    Mearl LatinKeith W. Peg Fifer, PA-C [facility] [phone](P) [phone] (O) 11/28/2013 11:22 AM

## 2013-11-28 NOTE — Progress Notes (Signed)
Physical Therapy Treatment Patient Details Name: Curtis EagleShannon H Levengood MRN: [id number] DOB: 1955/10/06 Today's Date: 11/28/2013    History of Present Illness 58 yo with new R TKA with drain removed today and planning possible SNF now    PT Comments    Pt was seen for AM visit with new concern about stairs with no rails, but has previously used crutches.  Plan in place to try crutches in PM to see what help can be offered with them.  Will table the concern about SNF until then.  Follow Up Recommendations  Home health PT;Supervision/Assistance - 24 hour     Equipment Recommendations  None recommended by PT    Recommendations for Other Services       Precautions / Restrictions Precautions Precautions: Knee;Fall Restrictions Weight Bearing Restrictions: Yes RLE Weight Bearing: Weight bearing as tolerated    Mobility  Bed Mobility Overal bed mobility: Needs Assistance Bed Mobility: Supine to Sit     Supine to sit: Supervision        Transfers Overall transfer level: Needs assistance Equipment used: Rolling walker (2 wheeled) Transfers: Sit to/from UGI CorporationStand;Stand Pivot Transfers Sit to Stand: Min assist Stand pivot transfers: Min guard       General transfer comment: More uncomfortable due to drain just being removed  Ambulation/Gait Ambulation/Gait assistance: Supervision Ambulation Distance (Feet): 250 Feet Assistive device: Rolling walker (2 wheeled) Gait Pattern/deviations: Step-through pattern;Trunk flexed;Narrow base of support Gait velocity: reduced Gait velocity interpretation: Below normal speed for age/gender General Gait Details: more confident with no rest stops needed   Stairs Stairs: Yes Stairs assistance: Supervision Stair Management: Two rails Number of Stairs: 10 General stair comments: used no splint so limited number and pt began to express convcern that he had stairs without railing to climb afterward  Wheelchair Mobility    Modified  Rankin (Stroke Patients Only)       Balance Overall balance assessment: Modified Independent                                  Cognition Arousal/Alertness: Awake/alert Behavior During Therapy: WFL for tasks assessed/performed Overall Cognitive Status: Within Functional Limits for tasks assessed                      Exercises Total Joint Exercises Ankle Circles/Pumps: AROM;Both;5 reps Quad Sets: AROM;Both;10 reps Gluteal Sets: AROM;Both;10 reps Straight Leg Raises: AROM;Both;10 reps Long Arc Quad: Strengthening;AROM;Both;10 reps Knee Flexion: Strengthening;Right;10 reps    General Comments General comments (skin integrity, edema, etc.): Drain removed with clean bandage R knee.  Good tolerance for mobility today      Pertinent Vitals/Pain Pain Assessment: 0-10 Pain Score: 6  Pain Location: R knee Pain Intervention(s): Limited activity within patient's tolerance;Monitored during session;Premedicated before session    Home Living                      Prior Function            PT Goals (current goals can now be found in the care plan section) Acute Rehab PT Goals Patient Stated Goal: to be safe getting around his home Progress towards PT goals: Progressing toward goals    Frequency  7X/week    PT Plan Current plan remains appropriate    Co-evaluation             End of Session Equipment Utilized During Treatment: Gait belt  Activity Tolerance: Patient tolerated treatment well Patient left: in chair;with call bell/phone within reach;with family/visitor present     Time: 7829-56211115-1141 PT Time Calculation (min): 26 min  Charges:  $Gait Training: 8-22 mins $Therapeutic Exercise: 8-22 mins                    G Codes:      Ivar DrapeStout, Quantavious Eggert E 11/28/2013, 12:22 PM Samul Dadauth Babe Anthis, PT MS Acute Rehab Dept. Number: [phone]

## 2013-11-29 ENCOUNTER — Encounter (HOSPITAL_COMMUNITY): Payer: Self-pay | Admitting: General Practice

## 2013-11-29 LAB — CBC
HCT: 27.1 % — ABNORMAL LOW (ref 39.0–52.0)
HEMOGLOBIN: 9.5 g/dL — AB (ref 13.0–17.0)
MCH: 33.2 pg (ref 26.0–34.0)
MCHC: 35.1 g/dL (ref 30.0–36.0)
MCV: 94.8 fL (ref 78.0–100.0)
Platelets: 148 10*3/uL — ABNORMAL LOW (ref 150–400)
RBC: 2.86 MIL/uL — ABNORMAL LOW (ref 4.22–5.81)
RDW: 13 % (ref 11.5–15.5)
WBC: 8.2 10*3/uL (ref 4.0–10.5)

## 2013-11-29 NOTE — Progress Notes (Signed)
Physical Therapy Treatment Patient Details Name: Curtis EagleShannon H Barton MRN: [id number] DOB: December 20, 1955 Today's Date: 11/29/2013    History of Present Illness Patient is a 58 yo male admitted 11/26/13 s/p Rt TKA.  PMH: HTN, TIA, A flutter    PT Comments    Patient progressing well with therapy.  Agree with need for HHPT for f/u therapy at discharge.  Patient able to negotiate stairs with crutches without rails.  Will need crutches for use at home.  MD*: Please write order for crutches if you agree.  Thank you.   Follow Up Recommendations  Home health PT;Supervision/Assistance - 24 hour     Equipment Recommendations  Crutches;3in1 (PT) (Wide 3-in-1 BSC)    Recommendations for Other Services       Precautions / Restrictions Precautions Precautions: Knee;Fall Precaution Comments: Reviewed knee precautions with patient and wife Restrictions Weight Bearing Restrictions: Yes RLE Weight Bearing: Weight bearing as tolerated    Mobility  Bed Mobility                  Transfers Overall transfer level: Needs assistance Equipment used: Rolling walker (2 wheeled);Crutches Transfers: Sit to/from Stand Sit to Stand: Min guard         General transfer comment: Verbal cues for use of crutches with transfers.  Patient able to move sit <> stand from chair and low mat table with RW and with crutches.  Assist for safety only.  Ambulation/Gait Ambulation/Gait assistance: Supervision Ambulation Distance (Feet): 120 Feet Assistive device: Rolling walker (2 wheeled);Crutches Gait Pattern/deviations: Step-through pattern;Decreased stance time - right;Decreased step length - left;Decreased weight shift to right;Antalgic Gait velocity: Decreased Gait velocity interpretation: Below normal speed for age/gender General Gait Details: Verbal cues for use of crutches.  Able to ambulate safely with RW and with crutches.   Stairs Stairs: Yes Stairs assistance: Min guard Stair Management: No  rails;Step to pattern;Forwards;With crutches Number of Stairs: 3 (3 stairs x2) General stair comments: Instructed patient and wife on negotiation of stairs with crutches.  Instructed wife on safe guarding technique.  Patient able to practice 3 stairs x2.  Wheelchair Mobility    Modified Rankin (Stroke Patients Only)       Balance                                    Cognition Arousal/Alertness: Awake/alert Behavior During Therapy: WFL for tasks assessed/performed Overall Cognitive Status: Within Functional Limits for tasks assessed                      Exercises Total Joint Exercises Ankle Circles/Pumps: AROM;Both;10 reps;Seated Quad Sets: AROM;Right;10 reps;Seated Heel Slides: AROM;Right;10 reps;Seated Long Arc Quad: AROM;Right;10 reps;Seated Knee Flexion: AROM;Right;10 reps;Seated Goniometric ROM: -15* to 75*    General Comments        Pertinent Vitals/Pain Pain Assessment: 0-10 Pain Score: 4  Pain Location: Rt knee Pain Descriptors / Indicators: Aching;Sore Pain Intervention(s): Monitored during session;Repositioned    Home Living Family/patient expects to be discharged to:: Private residence Living Arrangements: Spouse/significant other;Children                  Prior Function            PT Goals (current goals can now be found in the care plan section) Acute Rehab PT Goals Patient Stated Goal: to be safe getting around his home Progress towards PT goals: Progressing toward goals  Frequency  7X/week    PT Plan Current plan remains appropriate;Equipment recommendations need to be updated    Co-evaluation             End of Session Equipment Utilized During Treatment: Gait belt Activity Tolerance: Patient tolerated treatment well Patient left: in chair;with call bell/phone within reach;with family/visitor present     Time: 1610-96041003-1032 PT Time Calculation (min): 29 min  Charges:  $Gait Training: 8-22  mins $Therapeutic Exercise: 8-22 mins                    G Codes:      Vena AustriaDavis, Carrell Rahmani H 11/29/2013, 10:51 AM Durenda HurtSusan H. Renaldo Fiddleravis, PT, [facility] Acute Rehab Services Pager [phone]

## 2013-11-29 NOTE — Progress Notes (Signed)
Curtis Barton to be D/C'd Home per MD order. Discussed with the patient and all questions fully answered.    Medication List         apixaban 5 MG Tabs tablet  Commonly known as:  ELIQUIS  Take 1 tablet (5 mg total) by mouth 2 (two) times daily.     cyclobenzaprine 10 MG tablet  Commonly known as:  FLEXERIL  Take 10 mg by mouth 3 (three) times daily as needed for muscle spasms.     hydrochlorothiazide 25 MG tablet  Commonly known as:  HYDRODIURIL  Take 25 mg by mouth daily.     tapentadol 50 MG Tabs tablet  Commonly known as:  NUCYNTA  Take 1-2 tabs po q4-6hrs prn pain        VVS, Skin clean, dry and intact without evidence of skin break down, no evidence of skin tears noted.  IV catheter discontinued intact. Site without signs and symptoms of complications. Dressing and pressure applied.  An After Visit Summary was printed and given to the patient.  Patient escorted via WC, and D/C home via private auto.  [name], [name]  11/29/2013 3:23 PM

## 2013-11-29 NOTE — Progress Notes (Signed)
Utilization review completed.  

## 2013-11-29 NOTE — Care Management Note (Signed)
CARE MANAGEMENT NOTE 11/29/2013  Patient:  Curtis Barton,Curtis Barton   Account Number:  [id number]  Date Initiated:  11/29/2013  Documentation initiated by:  Vance PeperBRADY,Lakia Gritton  Subjective/Objective Assessment:   58 yr old male admitted with osteoarthritis of right knee. patient underwent right total knee arthroplasty.     Action/Plan:   Case manager spoke with patient and wife concerning home health and DME needs, [facility] no changes. Patient has RW, [facility] as well as CPM.   Anticipated DC Date:  11/29/2013   Anticipated DC Plan:  HOME W HOME HEALTH SERVICES      DC Planning Services  CM consult      PAC Choice  DURABLE MEDICAL EQUIPMENT  HOME HEALTH   Choice offered to / List presented to:  C-1 Patient   DME arranged  WALKER - WIDE        HH arranged  HH-2 PT      [facility]  [facility]   Status of service:  Completed, signed off Medicare Important Message given?   (If response is "NO", the following Medicare IM given date fields will be blank) Date Medicare IM given:   Medicare IM given by:   Date Additional Medicare IM given:   Additional Medicare IM given by:    Discharge Disposition:  HOME W HOME HEALTH SERVICES  Per UR Regulation:  Reviewed for med. necessity/level of care/duration of stay

## 2013-11-29 NOTE — Progress Notes (Signed)
Discharge instructions given. Pt verbalized understanding and all questions were answered.  

## 2013-11-30 ENCOUNTER — Encounter (HOSPITAL_COMMUNITY): Payer: Self-pay | Admitting: Orthopedic Surgery

## 2013-12-29 NOTE — Discharge Summary (Signed)
PATIENT ID: Curtis Barton Delellis        MRN:  [id number]          DOB/AGE: November 15, 1955 / 58 y.o.    DISCHARGE SUMMARY  ADMISSION DATE:    11/26/2013 DISCHARGE DATE:   11/29/13  ADMISSION DIAGNOSIS: OA RIGHT KNEE    DISCHARGE DIAGNOSIS:  OA RIGHT KNEE    ADDITIONAL DIAGNOSIS: Principal Problem:   Primary localized osteoarthritis of right knee Active Problems:   Atrial flutter   Hypertension   Arthritis   TIA (transient ischemic attack)   Osteoarthritis of right knee  Past Medical History  Diagnosis Date  . Hypertension   . Arthritis   . TIA (transient ischemic attack) 2010  . Atrial flutter   . Primary localized osteoarthritis of right knee   . Constipation     severe constipation after past surgeries  . Dysrhythmia 2015    Atrial Flutter; s/p ablation  . Complication of anesthesia     severe constipation;  is resistant to being intubated  . Asthma     as child  . Cancer   . Fuchs' uveitis syndrome     left eye    PROCEDURE: Procedure(s): RIGHT TOTAL KNEE ARTHROPLASTY Right on 11/26/2013  CONSULTS: none     HISTORY:  See Barton&P in chart  HOSPITAL COURSE:  Curtis Barton Kruser is a 58 y.o. admitted on 11/26/2013 and found to have a diagnosis of OA RIGHT KNEE.  After appropriate laboratory studies were obtained  they were taken to the operating room on 11/26/2013 and underwent  Procedure(s): RIGHT TOTAL KNEE ARTHROPLASTY  Right.   They were given perioperative antibiotics:  Anti-infectives    Start     Dose/Rate Route Frequency Ordered Stop   11/26/13 1630  ceFAZolin (ANCEF) IVPB 2 g/50 mL premix     2 g100 mL/hr over 30 Minutes Intravenous Every 6 hours 11/26/13 1449 11/26/13 2231   11/26/13 0600  ceFAZolin (ANCEF) IVPB 2 g/50 mL premix     2 g100 mL/hr over 30 Minutes Intravenous On call to O.R. 11/25/13 1436 11/26/13 1037    .  Tolerated the procedure well.   POD #1, allowed out of bed to a chair.  PT for ambulation and exercise program.  IV saline locked.  O2  discontionued.  POD #2, continued PT and ambulation.   Hemovac pulled. .  The remainder of the hospital course was dedicated to ambulation and strengthening.   The patient was discharged on 3 days post op in  Stable condition.  Blood products given:none  DIAGNOSTIC STUDIES: Recent vital signs: No data found.      Recent laboratory studies: No results for input(s): WBC, HGB, HCT, PLT in the last 168 hours. No results for input(s): NA, K, CL, CO2, BUN, CREATININE, GLUCOSE, CALCIUM in the last 168 hours. Lab Results  Component Value Date   INR 1.15 11/17/2013     Recent Radiographic Studies :  No results found.  DISCHARGE INSTRUCTIONS:   DISCHARGE MEDICATIONS:     Medication List    TAKE these medications        apixaban 5 MG Tabs tablet  Commonly known as:  ELIQUIS  Take 1 tablet (5 mg total) by mouth 2 (two) times daily.     cyclobenzaprine 10 MG tablet  Commonly known as:  FLEXERIL  Take 10 mg by mouth 3 (three) times daily as needed for muscle spasms.     hydrochlorothiazide 25 MG tablet  Commonly known  as:  HYDRODIURIL  Take 25 mg by mouth daily.     tapentadol 50 MG Tabs tablet  Commonly known as:  NUCYNTA  Take 1-2 tabs po q4-6hrs prn pain        FOLLOW UP VISIT:       Follow-up Information    Follow up with CAFFREY JR,W D, MD. Schedule an appointment as soon as possible for a visit in 2 weeks.   Specialty:  Orthopedic Surgery   Contact information:   [address] [place] [address] [phone]       [facility]   Why:  [facility] will contact you concerning start time for physical therapy.   Contact information:   [address] [place] [address] [phone]       DISPOSITION:   Home  CONDITION:  Stable     12/29/2013 2:51 PM

## 2014-01-20 ENCOUNTER — Encounter (HOSPITAL_COMMUNITY): Payer: Self-pay | Admitting: Internal Medicine

## 2014-02-27 ENCOUNTER — Encounter (HOSPITAL_BASED_OUTPATIENT_CLINIC_OR_DEPARTMENT_OTHER): Payer: BC Managed Care – PPO

## 2014-03-30 ENCOUNTER — Encounter: Payer: Self-pay | Admitting: Podiatry

## 2014-03-30 ENCOUNTER — Ambulatory Visit: Payer: 59

## 2014-03-30 ENCOUNTER — Ambulatory Visit (INDEPENDENT_AMBULATORY_CARE_PROVIDER_SITE_OTHER): Payer: 59

## 2014-03-30 ENCOUNTER — Ambulatory Visit (INDEPENDENT_AMBULATORY_CARE_PROVIDER_SITE_OTHER): Payer: 59 | Admitting: Podiatry

## 2014-03-30 VITALS — BP 156/89 | HR 90 | Resp 16

## 2014-03-30 DIAGNOSIS — M779 Enthesopathy, unspecified: Secondary | ICD-10-CM

## 2014-03-30 DIAGNOSIS — M2040 Other hammer toe(s) (acquired), unspecified foot: Secondary | ICD-10-CM | POA: Diagnosis not present

## 2014-03-30 DIAGNOSIS — L84 Corns and callosities: Secondary | ICD-10-CM

## 2014-03-30 DIAGNOSIS — M2011 Hallux valgus (acquired), right foot: Secondary | ICD-10-CM

## 2014-03-30 MED ORDER — TRIAMCINOLONE ACETONIDE 10 MG/ML IJ SUSP
10.0000 mg | Freq: Once | INTRAMUSCULAR | Status: AC
  Administered 2014-03-30: 10 mg

## 2014-03-30 NOTE — Progress Notes (Signed)
   Subjective:    Patient ID: Curtis Barton, male    DOB: [birth year], 59 y.o.   MRN: [id number]  HPI Pt presents with bilateral bunion deformity, also has right toe pain at 5th met, callused area   Review of Systems  All other systems reviewed and are negative.      Objective:   Physical Exam        Assessment & Plan:

## 2014-03-31 NOTE — Progress Notes (Signed)
Subjective:     Patient ID: Curtis Barton, male   DOB: Sep 05, 1955, 59 y.o.   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 Has been going on for years and has worsened gradually over that time   Review of Systems  All other systems reviewed and are negative.      Objective:   Physical Exam  Constitutional: He is oriented to person, place, and time.  Cardiovascular: Intact distal pulses.   Musculoskeletal: Normal range of motion.  Neurological: He is oriented to person, place, and time.  Skin: Skin is warm.  Nursing note and vitals reviewed.  neurovascular status intact with muscle strength adequate and range of motion subtalar and midtarsal joint. Patient's noted to have severe forefoot deformity right over left foot with redness and pain with pressure and is noted to have keratotic lesion sub-fifth metatarsal head right with fluid buildup with pressure. 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 I injected the head of the fifth MPJ 3 mg Dexon some Kenalog 5 mill grams Xylocaine and debrided the lesion and he will reappoint as needed or if he decides for surgery approximately 2-4 weeks prior to procedure. 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

## 2014-05-16 ENCOUNTER — Encounter: Payer: Self-pay | Admitting: Internal Medicine

## 2014-05-16 ENCOUNTER — Ambulatory Visit (INDEPENDENT_AMBULATORY_CARE_PROVIDER_SITE_OTHER): Payer: 59 | Admitting: Internal Medicine

## 2014-05-16 VITALS — BP 158/100 | HR 66 | Ht 76.0 in | Wt 273.4 lb

## 2014-05-16 DIAGNOSIS — Z01812 Encounter for preprocedural laboratory examination: Secondary | ICD-10-CM

## 2014-05-16 DIAGNOSIS — I483 Typical atrial flutter: Secondary | ICD-10-CM | POA: Diagnosis not present

## 2014-05-16 LAB — CBC WITH DIFFERENTIAL/PLATELET
BASOS PCT: 0.5 % (ref 0.0–3.0)
Basophils Absolute: 0 10*3/uL (ref 0.0–0.1)
EOS ABS: 0.2 10*3/uL (ref 0.0–0.7)
EOS PCT: 3 % (ref 0.0–5.0)
HEMATOCRIT: 42.9 % (ref 39.0–52.0)
Hemoglobin: 14.7 g/dL (ref 13.0–17.0)
LYMPHS PCT: 25.4 % (ref 12.0–46.0)
Lymphs Abs: 2.1 10*3/uL (ref 0.7–4.0)
MCHC: 34.3 g/dL (ref 30.0–36.0)
MCV: 92.4 fl (ref 78.0–100.0)
Monocytes Absolute: 0.8 10*3/uL (ref 0.1–1.0)
Monocytes Relative: 9.4 % (ref 3.0–12.0)
Neutro Abs: 5.2 10*3/uL (ref 1.4–7.7)
Neutrophils Relative %: 61.7 % (ref 43.0–77.0)
Platelets: 226 10*3/uL (ref 150.0–400.0)
RBC: 4.64 Mil/uL (ref 4.22–5.81)
RDW: 14.7 % (ref 11.5–15.5)
WBC: 8.4 10*3/uL (ref 4.0–10.5)

## 2014-05-16 LAB — LIPID PANEL
CHOLESTEROL: 190 mg/dL (ref 0–200)
HDL: 40.3 mg/dL (ref >39.00)
LDL Cholesterol: 121 mg/dL — ABNORMAL HIGH (ref 0–99)
NonHDL: 149.7
Total CHOL/HDL Ratio: 5
Triglycerides: 144 mg/dL (ref 0.0–149.0)
VLDL: 28.8 mg/dL (ref 0.0–40.0)

## 2014-05-16 LAB — BASIC METABOLIC PANEL
BUN: 12 mg/dL (ref 6–23)
CO2: 31 mEq/L (ref 19–32)
CREATININE: 0.9 mg/dL (ref 0.40–1.50)
Calcium: 9.8 mg/dL (ref 8.4–10.5)
Chloride: 99 mEq/L (ref 96–112)
GFR: 92.07 mL/min (ref >60.00)
GLUCOSE: 90 mg/dL (ref 70–99)
POTASSIUM: 3.7 meq/L (ref 3.5–5.1)
Sodium: 136 mEq/L (ref 135–145)

## 2014-05-16 MED ORDER — LISINOPRIL 5 MG PO TABS: 5.0000 mg | ORAL_TABLET | Freq: Every day | ORAL | Status: DC

## 2014-05-16 NOTE — Progress Notes (Signed)
Patient Care Team: Kaleen MaskWilson Strader Elkins, MD as PCP - General (Family Medicine)   HPI  Curtis Barton is a 59 y.o. male Seen in followup for atrial flutter for which he underwent catheter ablation while in hospital 9/15.   Echocardiogram at that time demonstrated normal left ventricular function.  CHADS-VASc score was 3 with hypertension and presumed TIAs    Knee replacement surgery 10/16   Over the last couple of months, he has noted significant change in his exercise capacity manifested by dyspnea on exertion and a midsternal-shoulder chest discomfort. These both relieved with rest and/or recurrent with repeated exertion.  Cardiac risk factors include hypertension, vascular disease, smoking;  We dont have lipids .   No smoking in the last 4 weeks.  Also complains of palpitations less long lasting and not as fast as previously.  Sleep study has never been consummated  Past Medical History  Diagnosis Date  . Hypertension   . Arthritis   . TIA (transient ischemic attack) 2010  . Atrial flutter   . Primary localized osteoarthritis of right knee   . Constipation     severe constipation after past surgeries  . Dysrhythmia 2015    Atrial Flutter; s/p ablation  . Complication of anesthesia     severe constipation;  is resistant to being intubated  . Asthma     as child  . Cancer   . Fuchs' uveitis syndrome     left eye    Past Surgical History  Procedure Laterality Date  . Hernia repair    . Tonsillectomy    . Knee cartilage surgery Bilateral   . Acle repair Right 2000    ACL repair  . Cystoscopy w/ ureteral stent placement  1999  . Rotator cuff repair Right 2005  . Atrial ablation surgery  10/2013    Dr Graciela HusbandsKlein  . Eye surgery    . Cataract extraction w/ intraocular lens  implant, bilateral    . Kidney surgery    . Total knee arthroplasty Right 11/26/2013    dr caffrey  . Total knee arthroplasty Right 11/26/2013    Procedure: RIGHT TOTAL KNEE  ARTHROPLASTY;  Surgeon: Thera FlakeW D Caffrey Jr., MD;  Location: [facility] OR;  Service: Orthopedics;  Laterality: Right;  . Atrial flutter ablation N/A 10/13/2013    Procedure: ATRIAL FLUTTER ABLATION;  Surgeon: Duke SalviaSteven C Maliyah Willets, MD;  [facility];  Service: Cardiovascular;  Laterality: N/A;    Current Outpatient Prescriptions  Medication Sig Dispense Refill  . apixaban (ELIQUIS) 5 MG TABS tablet Take 1 tablet (5 mg total) by mouth 2 (two) times daily. 180 tablet 3  . cyclobenzaprine (FLEXERIL) 10 MG tablet Take 10 mg by mouth 3 (three) times daily as needed for muscle spasms.    . hydrochlorothiazide (HYDRODIURIL) 25 MG tablet Take 25 mg by mouth daily.    . tapentadol (NUCYNTA) 50 MG TABS tablet Take 1-2 tabs po q4-6hrs prn pain 100 tablet 0   No current facility-administered medications for this visit.    Allergies  Allergen Reactions  . Hydrocodone Itching  . Oxycodone Itching  . Codeine Itching    Review of Systems negative except from HPI and PMH  Physical Exam BP 158/100 mmHg  Pulse 66  Ht 6\' 4"  (1.93 m)  Wt 273 lb 6.4 oz (124.013 kg)  BMI 33.29 kg/m2 Well developed and well nourished in no acute distress HENT normal E scleral and icterus clear Neck Supple JVP flat; carotids  brisk and full Clear to ausculation  Regular rate and rhythm, no murmurs gallops or rub Soft with active bowel sounds No clubbing cyanosis  Edema Alert and oriented, grossly normal motor and sensory function Skin Warm and Dry   ECG today demonstrates sinus rhythm at 66 Interval 17/08/40 Nonspecific T-wave flattening this is new from September 2015   Assessment and  Plan  Atrial flutter status post ablation   Hypertension  Sleep disordered breathing   Palpitations   TIA--MRI neg  Chest discomfort-typical    There number of concerns. First and foremost  is dyspnea on exertion which is accompanied by typical anginal symptoms.  I have reviewed the case with Dr. Hedy Jacob and we will plan to  undertake catheterization as his pretest probabilities I think are sufficiently high to render a negative stress test in sufficiently diagnostic. We will check his lipid status today.  He's having significant problems with leg cramps; this may be related to his diuretic which is also inadequate for his blood pressure. We will begin him on lisinopril. We will plan to start this following his catheterization  creatinine 10/15 was 0.9. There is also notable that time that he was modestly anemic at 9.5.  We will recheck his CBC today.  As relates to his palpitations, we will undertake a 30 day event recorder.

## 2014-05-16 NOTE — Patient Instructions (Addendum)
Your physician has recommended you make the following change in your medication:  1) START Lisinopril 5 mg daily - start after your cardiac cath.  Your physician has recommended that you wear an event monitor. Event monitors are medical devices that record the heart's electrical activity. Doctors most often [place] these monitors to diagnose arrhythmias. Arrhythmias are problems with the speed or rhythm of the heartbeat. The monitor is a small, portable device. You can wear one while you do your normal daily activities. This is usually used to diagnose what is causing palpitations/syncope (passing out).  Your physician has requested that you have a cardiac catheterization. Cardiac catheterization is used to diagnose and/or treat various heart conditions. Doctors may recommend this procedure for a number of different reasons. The most common reason is to evaluate chest pain. Chest pain can be a symptom of coronary artery disease (CAD), and cardiac catheterization can show whether plaque is narrowing or blocking your heart's arteries. This procedure is also used to evaluate the valves, as well as measure the blood flow and oxygen levels in different parts of your heart. For further information please visit [email or web address] Please follow instruction sheet, as given.  Pre procedure labs today: BMET, CBCD, Lipids w/ direct LDL  You are scheduled for a cardiac catheterization on 05/17/14 with Dr. Eldridge Dace or associate.  Go to [facility] 2nd Floor on at 11:30 a.m. Enter thru the [employer] entrance A No food or drink after midnight tonight. Hold your Eliquis tonight and tomorrow morning. You may take your medications with a sip of water on the day of your procedure.    Your physician wants you to follow-up in: 1 year with Dr. Graciela Husbands.  You will receive a reminder letter in the mail two months in advance. If you don't receive a letter, please call our office to schedule the follow-up  appointment.   Coronary Angiogram A coronary angiogram, also called coronary angiography, is an X-ray procedure used to look at the arteries in the heart. In this procedure, a dye (contrast dye) is injected through a long, hollow tube (catheter). The catheter is about the size of a piece of cooked spaghetti and is inserted through your groin, wrist, or arm. The dye is injected into each artery, and X-rays are then taken to show if there is a blockage in the arteries of your heart. LET [facility] CARE PROVIDER KNOW ABOUT:  Any allergies you have, including allergies to shellfish or contrast dye.   All medicines you are taking, including vitamins, herbs, eye drops, creams, and over-the-counter medicines.   Previous problems you or members of your family have had with the use of anesthetics.   Any blood disorders you have.   Previous surgeries you have had.  History of kidney problems or failure.   Other medical conditions you have. RISKS AND COMPLICATIONS  Generally, a coronary angiogram is a safe procedure. However, problems can occur and include:  Allergic reaction to the dye.  Bleeding from the access site or other locations.  Kidney injury, especially in people with impaired kidney function.  Stroke (rare).  Heart attack (rare). BEFORE THE PROCEDURE   Do not eat or drink anything after midnight the night before the procedure or as directed by your health care provider.   Ask your health care provider about changing or stopping your regular medicines. This is especially important if you are taking diabetes medicines or blood thinners. PROCEDURE  You may be given a medicine to help you relax (  sedative) before the procedure. This medicine is given through an intravenous (IV) access tube that is inserted into one of your veins.   The area where the catheter will be inserted will be washed and shaved. This is usually done in the groin but may be done in the fold of your  arm (near your elbow) or in the wrist.   A medicine will be given to numb the area where the catheter will be inserted (local anesthetic).   The health care provider will insert the catheter into an artery. The catheter will be guided by using a special type of X-ray (fluoroscopy) of the blood vessel being examined.   A special dye will then be injected into the catheter, and X-rays will be taken. The dye will help to show where any narrowing or blockages are located in the heart arteries.  AFTER THE PROCEDURE   If the procedure is done through the leg, you will be kept in bed lying flat for several hours. You will be instructed to not bend or cross your legs.  The insertion site will be checked frequently.   The pulse in your feet or wrist will be checked frequently.   Additional blood tests, X-rays, and an electrocardiogram may be done.  Document Released: 08/04/2002 Document Revised: 06/14/2013 Document Reviewed: 06/22/2012 [facility] Patient Information 2015 [place], [place]. This information is not intended to replace advice given to you by your health care provider. Make sure you discuss any questions you have with your health care provider.   Lisinopril tablets What is this medicine? LISINOPRIL (lyse IN oh pril) is an ACE inhibitor. This medicine is used to treat high blood pressure and heart failure. It is also used to protect the heart immediately after a heart attack. This medicine may be used for other purposes; ask your health care provider or pharmacist if you have questions. COMMON BRAND NAME(S): Prinivil, Zestril What should I tell my health care provider before I take this medicine? They need to know if you have any of these conditions: -diabetes -heart or blood vessel disease -immune system disease like lupus or scleroderma -kidney disease -low blood pressure -previous swelling of the tongue, face, or lips with difficulty breathing, difficulty swallowing,  hoarseness, or tightening of the throat -an unusual or allergic reaction to lisinopril, other ACE inhibitors, insect venom, foods, dyes, or preservatives -pregnant or trying to get pregnant -breast-feeding How should I use this medicine? Take this medicine by mouth with a glass of water. Follow the directions on your prescription label. You may take this medicine with or without food. Take your medicine at regular intervals. Do not stop taking this medicine except on the advice of your doctor or health care professional. Talk to your pediatrician regarding the use of this medicine in children. Special care may be needed. While this drug may be prescribed for children as young as 256 years of age for selected conditions, precautions do apply. Overdosage: If you think you have taken too much of this medicine contact a poison control center or emergency room at once. NOTE: This medicine is only for you. Do not share this medicine with others. What if I miss a dose? If you miss a dose, take it as soon as you can. If it is almost time for your next dose, take only that dose. Do not take double or extra doses. What may interact with this medicine? -diuretics -lithium -NSAIDs, medicines for pain and inflammation, like ibuprofen or naproxen -over-the-counter herbal supplements  like hawthorn -potassium salts or potassium supplements -salt substitutes This list may not describe all possible interactions. Give your health care provider a list of all the medicines, herbs, non-prescription drugs, or dietary supplements you use. Also tell them if you smoke, drink alcohol, or use illegal drugs. Some items may interact with your medicine. What should I watch for while using this medicine? Visit your doctor or health care professional for regular check ups. Check your blood pressure as directed. Ask your doctor what your blood pressure should be, and when you should contact him or her. Call your doctor or health  care professional if you notice an irregular or fast heart beat. Women should inform their doctor if they wish to become pregnant or think they might be pregnant. There is a potential for serious side effects to an unborn child. Talk to your health care professional or pharmacist for more information. Check with your doctor or health care professional if you get an attack of severe diarrhea, nausea and vomiting, or if you sweat a lot. The loss of too much body fluid can make it dangerous for you to take this medicine. You may get drowsy or dizzy. Do not drive, use machinery, or do anything that needs mental alertness until you know how this drug affects you. Do not stand or sit up quickly, especially if you are an older patient. This reduces the risk of dizzy or fainting spells. Alcohol can make you more drowsy and dizzy. Avoid alcoholic drinks. Avoid salt substitutes unless you are told otherwise by your doctor or health care professional. Do not treat yourself for coughs, colds, or pain while you are taking this medicine without asking your doctor or health care professional for advice. Some ingredients may increase your blood pressure. What side effects may I notice from receiving this medicine? Side effects that you should report to your doctor or health care professional as soon as possible: -abdominal pain with or without nausea or vomiting -allergic reactions like skin rash or hives, swelling of the hands, feet, face, lips, throat, or tongue -dark urine -difficulty breathing -dizzy, lightheaded or fainting spell -fever or sore throat -irregular heart beat, chest pain -pain or difficulty passing urine -redness, blistering, peeling or loosening of the skin, including inside the mouth -unusually weak -yellowing of the eyes or skin Side effects that usually do not require medical attention (report to your doctor or health care professional if they continue or are bothersome): -change in  taste -cough -decreased sexual function or desire -headache -sun sensitivity -tiredness This list may not describe all possible side effects. Call your doctor for medical advice about side effects. You may report side effects to FDA at 1-800-FDA-1088. Where should I keep my medicine? Keep out of the reach of children. Store at room temperature between 15 and 30 degrees C (59 and 86 degrees F). Protect from moisture. Keep container tightly closed. Throw away any unused medicine after the expiration date. NOTE: This sheet is a summary. It may not cover all possible information. If you have questions about this medicine, talk to your doctor, pharmacist, or health care provider.  2015, Elsevier/Gold Standard. (2007-08-03 17:36:32)

## 2014-05-17 ENCOUNTER — Encounter (HOSPITAL_COMMUNITY)
Admission: RE | Disposition: A | Discharge status: Home / Self Care | Payer: Self-pay | Source: Ambulatory Visit | Attending: Interventional Cardiology

## 2014-05-17 ENCOUNTER — Encounter (HOSPITAL_COMMUNITY): Payer: Self-pay | Admitting: Interventional Cardiology

## 2014-05-17 ENCOUNTER — Ambulatory Visit (HOSPITAL_COMMUNITY)
Admission: RE | Admit: 2014-05-17 | Discharge: 2014-05-17 | Disposition: A | Discharge status: Home / Self Care | Payer: 59 | Source: Ambulatory Visit | Attending: Interventional Cardiology | Admitting: Interventional Cardiology

## 2014-05-17 DIAGNOSIS — I2510 Atherosclerotic heart disease of native coronary artery without angina pectoris: Secondary | ICD-10-CM | POA: Diagnosis not present

## 2014-05-17 DIAGNOSIS — I209 Angina pectoris, unspecified: Secondary | ICD-10-CM | POA: Diagnosis present

## 2014-05-17 DIAGNOSIS — I25119 Atherosclerotic heart disease of native coronary artery with unspecified angina pectoris: Secondary | ICD-10-CM

## 2014-05-17 DIAGNOSIS — I10 Essential (primary) hypertension: Secondary | ICD-10-CM | POA: Insufficient documentation

## 2014-05-17 DIAGNOSIS — J45909 Unspecified asthma, uncomplicated: Secondary | ICD-10-CM | POA: Diagnosis not present

## 2014-05-17 DIAGNOSIS — M1990 Unspecified osteoarthritis, unspecified site: Secondary | ICD-10-CM | POA: Diagnosis not present

## 2014-05-17 DIAGNOSIS — Z886 Allergy status to analgesic agent status: Secondary | ICD-10-CM | POA: Diagnosis not present

## 2014-05-17 DIAGNOSIS — Z96659 Presence of unspecified artificial knee joint: Secondary | ICD-10-CM | POA: Diagnosis not present

## 2014-05-17 DIAGNOSIS — D649 Anemia, unspecified: Secondary | ICD-10-CM | POA: Insufficient documentation

## 2014-05-17 DIAGNOSIS — Z8673 Personal history of transient ischemic attack (TIA), and cerebral infarction without residual deficits: Secondary | ICD-10-CM | POA: Diagnosis not present

## 2014-05-17 DIAGNOSIS — I4892 Unspecified atrial flutter: Secondary | ICD-10-CM | POA: Insufficient documentation

## 2014-05-17 DIAGNOSIS — I483 Typical atrial flutter: Secondary | ICD-10-CM

## 2014-05-17 DIAGNOSIS — Z01812 Encounter for preprocedural laboratory examination: Secondary | ICD-10-CM

## 2014-05-17 HISTORY — PX: LEFT HEART CATHETERIZATION WITH CORONARY ANGIOGRAM: SHX5451

## 2014-05-17 LAB — LDL CHOLESTEROL, DIRECT: Direct LDL: 140 mg/dL

## 2014-05-17 SURGERY — LEFT HEART CATHETERIZATION WITH CORONARY ANGIOGRAM

## 2014-05-17 MED ORDER — SODIUM CHLORIDE 0.9 % IJ SOLN: 3.0000 mL | Freq: Two times a day (BID) | INTRAMUSCULAR | Status: DC

## 2014-05-17 MED ORDER — VERAPAMIL HCL 2.5 MG/ML IV SOLN
INTRAVENOUS | Status: AC
  Filled 2014-05-17: qty 2

## 2014-05-17 MED ORDER — SODIUM CHLORIDE 0.9 % IV SOLN
INTRAVENOUS | Status: DC
  Administered 2014-05-17: 12:00:00 via INTRAVENOUS

## 2014-05-17 MED ORDER — SODIUM CHLORIDE 0.9 % IV SOLN: 1.0000 mL/kg/h | INTRAVENOUS | Status: DC

## 2014-05-17 MED ORDER — HEPARIN SODIUM (PORCINE) 1000 UNIT/ML IJ SOLN
INTRAMUSCULAR | Status: AC
  Filled 2014-05-17: qty 1

## 2014-05-17 MED ORDER — HEPARIN (PORCINE) IN NACL 2-0.9 UNIT/ML-% IJ SOLN
INTRAMUSCULAR | Status: AC
  Filled 2014-05-17: qty 1000

## 2014-05-17 MED ORDER — SODIUM CHLORIDE 0.9 % IV SOLN: 250.0000 mL | INTRAVENOUS | Status: DC | PRN

## 2014-05-17 MED ORDER — MIDAZOLAM HCL 2 MG/2ML IJ SOLN
INTRAMUSCULAR | Status: AC
  Filled 2014-05-17: qty 2

## 2014-05-17 MED ORDER — HYDRALAZINE HCL 20 MG/ML IJ SOLN
INTRAMUSCULAR | Status: AC
  Filled 2014-05-17: qty 1

## 2014-05-17 MED ORDER — ASPIRIN 81 MG PO CHEW
CHEWABLE_TABLET | ORAL | Status: AC
  Filled 2014-05-17: qty 1

## 2014-05-17 MED ORDER — SODIUM CHLORIDE 0.9 % IJ SOLN: 3.0000 mL | INTRAMUSCULAR | Status: DC | PRN

## 2014-05-17 MED ORDER — FENTANYL CITRATE 0.05 MG/ML IJ SOLN
INTRAMUSCULAR | Status: AC
  Filled 2014-05-17: qty 2

## 2014-05-17 MED ORDER — ASPIRIN 81 MG PO CHEW
81.0000 mg | CHEWABLE_TABLET | ORAL | Status: AC
  Administered 2014-05-17: 81 mg via ORAL

## 2014-05-17 MED ORDER — LIDOCAINE HCL (PF) 1 % IJ SOLN
INTRAMUSCULAR | Status: AC
  Filled 2014-05-17: qty 30

## 2014-05-17 NOTE — Discharge Instructions (Signed)
Radial Site Care °Refer to this sheet in the next few weeks. These instructions provide you with information on caring for yourself after your procedure. Your caregiver may also give you more specific instructions. Your treatment has been planned according to current medical practices, but problems sometimes occur. Call your caregiver if you have any problems or questions after your procedure. °HOME CARE INSTRUCTIONS °· You may shower the day after the procedure. Remove the bandage (dressing) and gently wash the site with plain soap and water. Gently pat the site dry. °· Do not apply powder or lotion to the site. °· Do not submerge the affected site in water for 3 to 5 days. °· Inspect the site at least twice daily. °· Do not flex or bend the affected arm for 24 hours. °· No lifting over 5 pounds (2.3 kg) for 5 days after your procedure. °· Do not drive home if you are discharged the same day of the procedure. Have someone else drive you. °· You may drive 24 hours after the procedure unless otherwise instructed by your caregiver. °· Do not operate machinery or power tools for 24 hours. °· A responsible adult should be with you for the first 24 hours after you arrive home. °What to expect: °· Any bruising will usually fade within 1 to 2 weeks. °· Blood that collects in the tissue (hematoma) may be painful to the touch. It should usually decrease in size and tenderness within 1 to 2 weeks. °SEEK IMMEDIATE MEDICAL CARE IF: °· You have unusual pain at the radial site. °· You have redness, warmth, swelling, or pain at the radial site. °· You have drainage (other than a small amount of blood on the dressing). °· You have chills. °· You have a fever or persistent symptoms for more than 72 hours. °· You have a fever and your symptoms suddenly get worse. °· Your arm becomes pale, cool, tingly, or numb. °· You have heavy bleeding from the site. Hold pressure on the site. °Document Released: 03/02/2010 Document Revised:  04/22/2011 Document Reviewed: 03/02/2010 °ExitCare® Patient Information ©2015 Exit[employer], LLC. This information is not intended to replace advice given to you by your health care provider. Make sure you discuss any questions you have with your health care provider. ° °

## 2014-05-17 NOTE — H&P (View-Only) (Signed)
    Patient Care Team: Wilson Lebron Elkins, MD as PCP - General (Family Medicine)   HPI  Curtis Barton is a 59 y.o. male Seen in followup for atrial flutter for which he underwent catheter ablation while in hospital 9/15.   Echocardiogram at that time demonstrated normal left ventricular function.  CHADS-VASc score was 3 with hypertension and presumed TIAs    Knee replacement surgery 10/16   Over the last couple of months, he has noted significant change in his exercise capacity manifested by dyspnea on exertion and a midsternal-shoulder chest discomfort. These both relieved with rest and/or recurrent with repeated exertion.  Cardiac risk factors include hypertension, vascular disease, smoking;  We dont have lipids .   No smoking in the last 4 weeks.  Also complains of palpitations less long lasting and not as fast as previously.  Sleep study has never been consummated  Past Medical History  Diagnosis Date  . Hypertension   . Arthritis   . TIA (transient ischemic attack) 2010  . Atrial flutter   . Primary localized osteoarthritis of right knee   . Constipation     severe constipation after past surgeries  . Dysrhythmia 2015    Atrial Flutter; s/p ablation  . Complication of anesthesia     severe constipation;  is resistant to being intubated  . Asthma     as child  . Cancer   . Fuchs' uveitis syndrome     left eye    Past Surgical History  Procedure Laterality Date  . Hernia repair    . Tonsillectomy    . Knee cartilage surgery Bilateral   . Acle repair Right 2000    ACL repair  . Cystoscopy w/ ureteral stent placement  1999  . Rotator cuff repair Right 2005  . Atrial ablation surgery  10/2013    Dr William Laske  . Eye surgery    . Cataract extraction w/ intraocular lens  implant, bilateral    . Kidney surgery    . Total knee arthroplasty Right 11/26/2013    dr caffrey  . Total knee arthroplasty Right 11/26/2013    Procedure: RIGHT TOTAL KNEE  ARTHROPLASTY;  Surgeon: W D Caffrey Jr., MD;  Location: [facility] OR;  Service: Orthopedics;  Laterality: Right;  . Atrial flutter ablation N/A 10/13/2013    Procedure: ATRIAL FLUTTER ABLATION;  Surgeon: Hoang Reich C Plato Alspaugh, MD;  Location: [facility] CATH LAB;  Service: Cardiovascular;  Laterality: N/A;    Current Outpatient Prescriptions  Medication Sig Dispense Refill  . apixaban (ELIQUIS) 5 MG TABS tablet Take 1 tablet (5 mg total) by mouth 2 (two) times daily. 180 tablet 3  . cyclobenzaprine (FLEXERIL) 10 MG tablet Take 10 mg by mouth 3 (three) times daily as needed for muscle spasms.    . hydrochlorothiazide (HYDRODIURIL) 25 MG tablet Take 25 mg by mouth daily.    . tapentadol (NUCYNTA) 50 MG TABS tablet Take 1-2 tabs po q4-6hrs prn pain 100 tablet 0   No current facility-administered medications for this visit.    Allergies  Allergen Reactions  . Hydrocodone Itching  . Oxycodone Itching  . Codeine Itching    Review of Systems negative except from HPI and PMH  Physical Exam BP 158/100 mmHg  Pulse 66  Ht 6' 4" (1.93 m)  Wt 273 lb 6.4 oz (124.013 kg)  BMI 33.29 kg/m2 Well developed and well nourished in no acute distress HENT normal E scleral and icterus clear Neck Supple JVP flat; carotids   brisk and full Clear to ausculation  Regular rate and rhythm, no murmurs gallops or rub Soft with active bowel sounds No clubbing cyanosis  Edema Alert and oriented, grossly normal motor and sensory function Skin Warm and Dry   ECG today demonstrates sinus rhythm at 66 Interval 17/08/40 Nonspecific T-wave flattening this is new from September 2015   Assessment and  Plan  Atrial flutter status post ablation   Hypertension  Sleep disordered breathing   Palpitations   TIA--MRI neg  Chest discomfort-typical    There number of concerns. First and foremost  is dyspnea on exertion which is accompanied by typical anginal symptoms.  I have reviewed the case with Dr. JV and we will plan to  undertake catheterization as his pretest probabilities I think are sufficiently high to render a negative stress test in sufficiently diagnostic. We will check his lipid status today.  He's having significant problems with leg cramps; this may be related to his diuretic which is also inadequate for his blood pressure. We will begin him on lisinopril. We will plan to start this following his catheterization  creatinine 10/15 was 0.9. There is also notable that time that he was modestly anemic at 9.5.  We will recheck his CBC today.  As relates to his palpitations, we will undertake a 30 day event recorder.      

## 2014-05-17 NOTE — CV Procedure (Signed)
       PROCEDURE:  Left heart catheterization with selective coronary angiography, left ventriculogram.  Abdominal aortogram.  INDICATIONS:  Stable angina  The risks, benefits, and details of the procedure were explained to the patient.  The patient verbalized understanding and wanted to proceed.  Informed written consent was obtained.  PROCEDURE TECHNIQUE:  After Xylocaine anesthesia a 88F slender sheath was placed in the right radial artery with a single anterior needle wall stick.   IV Heparin was given.  Right coronary angiography was done using a Judkins R4 guide catheter.  Left coronary angiography was done using a Judkins L3.5 guide catheter.  Left ventriculography was done using a pigtail catheter.  Abdominal aortogram was done using a power injection of contrast through the pigtail catheter. A TR band was used for hemostasis.   CONTRAST:  Total of 130 cc.  COMPLICATIONS:  None.    HEMODYNAMICS:  Aortic pressure was 137/83; LV pressure was 137/8; LVEDP 14.  There was no gradient between the left ventricle and aorta.    ANGIOGRAPHIC DATA:   The left main coronary artery is widely patent.  The left anterior descending artery is a large vessel which wraps around the apex. There are mild irregularities proximally. In the distal vessel, there is a focal 50% stenosis which is eccentric and appears less severe in some views.  The left circumflex artery is a large vessel which is patent. There is a large ramus vessel which branches across lateral wall and is widely patent.  The right coronary artery is a large, dominant vessel. In the mid vessel, there is a 50% stenosis. There are mild luminal irregularities in the remainder of the vessel.  LEFT VENTRICULOGRAM:  Left ventricular angiogram was done in the 30 RAO projection and revealed normal left ventricular wall motion and systolic function with an estimated ejection fraction of 60%.  LVEDP was 14 mmHg.  ABDOMINAL AORTOGRAM: There is no  abdominal aortic aneurysm. There is a single left renal artery which appears widely patent. There appears to be dual arterial supply to the right kidney with both vessels appearing patent. The aortoiliac bifurcation appears widely patent   IMPRESSIONS:  1. Mild to moderate focal coronary artery disease in the distal  left anterior descending artery and mid right coronary artery, as described above.  No apparent hemodynamically significant disease  2. Normal left ventricular systolic function.  LVEDP 14 mmHg.  Ejection fraction 60%. 3.   No abdominal aortic aneurysm. No renal artery stenosis.   RECOMMENDATION:   Continue aggressive medical therapy and preventive therapy for hypertension and coronary artery disease.  Hopefully, his anginal symptoms will improve with better blood pressure control.

## 2014-05-17 NOTE — Interval H&P Note (Signed)
Cath Lab Visit (complete for each Cath Lab visit)  Clinical Evaluation Leading to the Procedure:   ACS: No.  Non-ACS:    Anginal Classification: CCS III  Anti-ischemic medical therapy: No Therapy  Non-Invasive Test Results: No non-invasive testing performed  Prior CABG: No previous CABG  Ischemic Symptoms? CCS III (Marked limitation of ordinary activity) Anti-ischemic Medical Therapy? No Therapy Non-invasive Test Results? No non-invasive testing performed Prior CABG? No Previous CABG   Patient Information:   1-2V CAD, no prox LAD  A (7)  Indication: 20; Score: 7   Patient Information:   1-2V-CAD with DS 50-60% With No FFR, No IVUS  I (3)  Indication: 21; Score: 3   Patient Information:   1-2V-CAD with DS 50-60% With FFR  A (7)  Indication: 22; Score: 7   Patient Information:   1-2V-CAD with DS 50-60% With FFR>0.8, IVUS not significant  I (2)  Indication: 23; Score: 2   Patient Information:   3V-CAD without LMCA With Abnormal LV systolic function  A (9)  Indication: 48; Score: 9   Patient Information:   LMCA-CAD  A (9)  Indication: 49; Score: 9   Patient Information:   2V-CAD with prox LAD PCI  A (7)  Indication: 62; Score: 7   Patient Information:   2V-CAD with prox LAD CABG  A (8)  Indication: 62; Score: 8   Patient Information:   3V-CAD without LMCA With Low CAD burden(i.e., 3 focal stenoses, low SYNTAX score) PCI  A (7)  Indication: 63; Score: 7   Patient Information:   3V-CAD without LMCA With Low CAD burden(i.e., 3 focal stenoses, low SYNTAX score) CABG  A (9)  Indication: 63; Score: 9   Patient Information:   3V-CAD without LMCA E06c - Intermediate-high CAD burden (i.e., multiple diffuse lesions, presence of CTO, or high SYNTAX score) PCI  U (4)  Indication: 64; Score: 4   Patient Information:   3V-CAD without LMCA E06c - Intermediate-high CAD burden (i.e., multiple diffuse lesions, presence of  CTO, or high SYNTAX score) CABG  A (9)  Indication: 64; Score: 9   Patient Information:   LMCA-CAD With Isolated LMCA stenosis  PCI  U (6)  Indication: 65; Score: 6   Patient Information:   LMCA-CAD With Isolated LMCA stenosis  CABG  A (9)  Indication: 65; Score: 9   Patient Information:   LMCA-CAD Additional CAD, low CAD burden (i.e., 1- to 2-vessel additional involvement, low SYNTAX score) PCI  U (5)  Indication: 66; Score: 5   Patient Information:   LMCA-CAD Additional CAD, low CAD burden (i.e., 1- to 2-vessel additional involvement, low SYNTAX score) CABG  A (9)  Indication: 66; Score: 9   Patient Information:   LMCA-CAD Additional CAD, intermediate-high CAD burden (i.e., 3-vessel involvement, presence of CTO, or high SYNTAX score) PCI  I (3)  Indication: 67; Score: 3   Patient Information:   LMCA-CAD Additional CAD, intermediate-high CAD burden (i.e., 3-vessel involvement, presence of CTO, or high SYNTAX score) CABG  A (9)  Indication: 67; Score: 9     History and Physical Interval Note:  05/17/2014 2:28 PM  Curtis Barton  has presented today for surgery, with the diagnosis of chest discomfort  The various methods of treatment have been discussed with the patient and family. After consideration of risks, benefits and other options for treatment, the patient has consented to  Procedure(s): LEFT HEART CATHETERIZATION WITH CORONARY ANGIOGRAM (N/A) as a surgical intervention .  The patient's  history has been reviewed, patient examined, no change in status, stable for surgery.  I have reviewed the patient's chart and labs.  Questions were answered to the patient's satisfaction.     Curtis Barton S.

## 2014-05-23 ENCOUNTER — Other Ambulatory Visit: Payer: Self-pay | Admitting: *Deleted

## 2014-05-23 DIAGNOSIS — I483 Typical atrial flutter: Secondary | ICD-10-CM

## 2014-05-24 ENCOUNTER — Encounter (INDEPENDENT_AMBULATORY_CARE_PROVIDER_SITE_OTHER): Payer: 59

## 2014-05-24 ENCOUNTER — Encounter: Payer: Self-pay | Admitting: *Deleted

## 2014-05-24 DIAGNOSIS — R002 Palpitations: Secondary | ICD-10-CM

## 2014-05-24 DIAGNOSIS — I483 Typical atrial flutter: Secondary | ICD-10-CM | POA: Diagnosis not present

## 2014-05-24 NOTE — Progress Notes (Signed)
Patient ID: Corrinne EagleShannon H Bower, male   DOB: 06/23/1955, 59 y.o.   MRN: [id number] [employer] 30 day cardiac event monitor applied to patient.

## 2014-05-27 ENCOUNTER — Telehealth: Payer: Self-pay

## 2014-05-27 NOTE — Telephone Encounter (Signed)
Agree with above note. Patient was noted to have a run of SVT on monitor.

## 2014-05-27 NOTE — Telephone Encounter (Signed)
Earlier today, Dr. Jeannetta NapElkins called for DOD regarding monitor strips. Pulled strips from monitor for Dr. Norris Crossurner's review. Per Dr. Mayford Knifeurner, instructed the patient to START TOPROL XL 25 mg daily. Patient st Dr. Jeannetta NapElkins already gave him Rx for the drug and he took his first dose. Informed the patient that Dr. Odessa FlemingKlein's scheduler will contact him early next week for an appointment with Dr. Graciela HusbandsKlein. Patient agrees with treatment plan.

## 2014-06-06 ENCOUNTER — Ambulatory Visit (INDEPENDENT_AMBULATORY_CARE_PROVIDER_SITE_OTHER): Payer: 59 | Admitting: Internal Medicine

## 2014-06-06 ENCOUNTER — Encounter: Payer: Self-pay | Admitting: Internal Medicine

## 2014-06-06 VITALS — BP 160/92 | HR 61 | Ht 76.0 in | Wt 277.4 lb

## 2014-06-06 DIAGNOSIS — I471 Supraventricular tachycardia: Secondary | ICD-10-CM | POA: Diagnosis not present

## 2014-06-06 MED ORDER — LISINOPRIL 10 MG PO TABS: 10.0000 mg | ORAL_TABLET | Freq: Every day | ORAL | Status: DC

## 2014-06-06 NOTE — Patient Instructions (Addendum)
Medication Instructions:  Your physician has recommended you make the following change in your medication:  1) INCREASE Lisinopril to 10 mg daily 2) STOP Metoprolol Succinate  You have been given 3 different prescriptions to try. You may take them in any order. DO NOT take these medications at the same time.  1) Atenolol 25 mg daily 2) Metoprolol Tartrate 25 mg twice a day 3) Inderal LA 60 mg daily  Labwork: None  Testing/Procedures: None  Follow-Up: Your physician recommends that you schedule a follow-up appointment in: 6 weeks with Dr. Graciela HusbandsKlein on June 14th at 4 pm   Any Other Special Instructions Will Be Listed Below (If Applicable).

## 2014-06-06 NOTE — Progress Notes (Signed)
Patient Care Team: Kaleen MaskWilson Losey Elkins, MD as PCP - General (Family Medicine)   HPI  Curtis Barton is a 59 y.o. male Seen in followup for atrial flutter for which he underwent catheter ablation while in hospital 9/15.  Echocardiogram at that time demonstrated normal left ventricular function.  CHADS-VASc score was 3 with hypertension and presumed TIAs    Knee replacement surgery 10/16   Over the last couple of months, he has noted significant change in his exercise capacity manifested by dyspnea on exertion and a midsternal-shoulder chest discomfort. These both relieved with rest and/or recurrent with repeated exertion  LHC 4/16 >>50% LAD. He continues to struggle with dyspnea   Cardiac risk factors include hypertension, vascular disease, smoking;  We dont have lipids .   No smoking now  AT [facility] visis also complained of palpitations and was given an event recorder  This demonstrated nonsustained atrial tach and one PVC  He was given a beta blocker. This was associated with significant fatigue and somnolence.  His blood pressure is been elevated over recent months. He is no longer taking hydrochlorothiazide; this was associated with frequent urination as well as nighttime cramping.  Sleep study has never been consummated  Past Medical History  Diagnosis Date  . Hypertension   . Arthritis   . TIA (transient ischemic attack) 2010  . Atrial flutter   . Primary localized osteoarthritis of right knee   . Constipation     severe constipation after past surgeries  . Dysrhythmia 2015    Atrial Flutter; s/p ablation  . Complication of anesthesia     severe constipation;  is resistant to being intubated  . Asthma     as child  . Cancer   . Fuchs' uveitis syndrome     left eye    Past Surgical History  Procedure Laterality Date  . Hernia repair    . Tonsillectomy    . Knee cartilage surgery Bilateral   . Acle repair Right 2000    ACL repair  . Cystoscopy w/  ureteral stent placement  1999  . Rotator cuff repair Right 2005  . Atrial ablation surgery  10/2013    Dr Graciela HusbandsKlein  . Eye surgery    . Cataract extraction w/ intraocular lens  implant, bilateral    . Kidney surgery    . Total knee arthroplasty Right 11/26/2013    dr caffrey  . Total knee arthroplasty Right 11/26/2013    Procedure: RIGHT TOTAL KNEE ARTHROPLASTY;  Surgeon: Thera FlakeW D Caffrey Jr., MD;  Location: [facility] OR;  Service: Orthopedics;  Laterality: Right;  . Atrial flutter ablation N/A 10/13/2013    Procedure: ATRIAL FLUTTER ABLATION;  Surgeon: Duke SalviaSteven C Tywon Niday, MD;  [facility];  Service: Cardiovascular;  Laterality: N/A;  . Left heart catheterization with coronary angiogram N/A 05/17/2014    Procedure: LEFT HEART CATHETERIZATION WITH CORONARY ANGIOGRAM;  Surgeon: Corky CraftsJayadeep S Varanasi, MD;  [facility];  Service: Cardiovascular;  Laterality: N/A;    Current Outpatient Prescriptions  Medication Sig Dispense Refill  . apixaban (ELIQUIS) 5 MG TABS tablet Take 1 tablet (5 mg total) by mouth 2 (two) times daily. 180 tablet 3  . cyclobenzaprine (FLEXERIL) 10 MG tablet Take 10 mg by mouth 3 (three) times daily as needed for muscle spasms.    Marland Kitchen. lisinopril (PRINIVIL,ZESTRIL) 5 MG tablet Take 1 tablet (5 mg total) by mouth daily. 30 tablet 3  . tapentadol (NUCYNTA) 50 MG TABS  tablet Take 1-2 tabs po q4-6hrs prn pain 100 tablet 0   No current facility-administered medications for this visit.    Allergies  Allergen Reactions  . Hydrocodone Itching  . Oxycodone Itching  . Codeine Itching    Review of Systems negative except from HPI and PMH  Physical Exam BP 160/92 mmHg  Pulse 61  Ht  (1.93 m)  Wt 277 lb 6.4 oz (125.828 kg)  BMI 33.78 kg/m2 Well developed and well nourished in no acute distress HENT normal E scleral and icterus clear Neck Supple JVP flat; carotids brisk and full Clear to ausculation  Regular rate and rhythm, no murmurs gallops or rub Soft with active  bowel sounds No clubbing cyanosis  Edema Alert and oriented, grossly normal motor and sensory function Skin Warm and Dry   ECG was ordered today demonstrates sinus rhythm at 61 Intervals 17/08/39 Otherwise normal  Assessment and  Plan  Atrial flutter status post ablation   Hypertension  Sleep disordered breathing   Atrial tachycardia-nonsustained  TIA--MRI neg  Chest discomfort/dyspnea on exertion-  Neg cath  Dyslipidemia   His event recorder demonstrated nonsustained atrial tachycardia which was quite symptomatic. He improved significantly with beta blockers; however, metoprolol succinate has been associated with significant fatigue and somnolence. We will give him prescriptions for alternative beta blockers including atenolol, metoprolol tartrate and propranolol. We have reviewed the idiosyncratic nature of beta blocker side effects.  We could also potentially use calcium blockers in this regard is necessary.  We reviewed his catheterization. He would be appropriate, and his 10 year risk predictor is 15%, that he is not on statin therapy. We will hold off on this however, until we find a beta blocker so as not to confuse the picture of medication side effects.  His blood pressure is poorly controlled. We'll increase his lisinopril from 5--10 mg daily. It may well be that he would benefit from low-dose adjunctive HCT. We may also be able to up titrate beta blockers once we find one that works that would also affect his blood pressure positively.  I worry as to whether the atrial tachycardia is a harbinger of atrial fibrillation to come. He has had no interval sustained atrial arrhythmias.   he is also expressed a great deal of anxiety about these palpitations. I have tried to assure him in and of themselves, atrial arrhythmias have  only a modest, particularly in men,   impact on overall prognosis

## 2014-07-26 ENCOUNTER — Ambulatory Visit (INDEPENDENT_AMBULATORY_CARE_PROVIDER_SITE_OTHER): Payer: 59 | Admitting: Internal Medicine

## 2014-07-26 ENCOUNTER — Ambulatory Visit: Payer: 59 | Admitting: Internal Medicine

## 2014-07-26 ENCOUNTER — Encounter: Payer: Self-pay | Admitting: Internal Medicine

## 2014-07-26 VITALS — BP 138/94 | HR 57 | Ht 76.0 in | Wt 277.6 lb

## 2014-07-26 DIAGNOSIS — I483 Typical atrial flutter: Secondary | ICD-10-CM | POA: Diagnosis not present

## 2014-07-26 DIAGNOSIS — I10 Essential (primary) hypertension: Secondary | ICD-10-CM | POA: Diagnosis not present

## 2014-07-26 MED ORDER — DILTIAZEM HCL 120 MG PO TABS: 120.0000 mg | ORAL_TABLET | Freq: Every day | ORAL | Status: DC

## 2014-07-26 MED ORDER — LISINOPRIL 10 MG PO TABS: 10.0000 mg | ORAL_TABLET | Freq: Every day | ORAL | Status: DC

## 2014-07-26 MED ORDER — VERAPAMIL HCL ER 120 MG PO TBCR: 120.0000 mg | EXTENDED_RELEASE_TABLET | Freq: Every day | ORAL | Status: DC

## 2014-07-26 MED ORDER — APIXABAN 5 MG PO TABS: 5.0000 mg | ORAL_TABLET | Freq: Two times a day (BID) | ORAL | Status: DC

## 2014-07-26 NOTE — Patient Instructions (Addendum)
Medication Instructions:  Your physician has recommended you make the following change in your medication:  1) STOP Propranolol  2) START Cardizem 120 mg tablet by mouth daily 3) START Verapamil 120 mg tablet by mouth at daily at bedtime   Labwork: NONE  Testing/Procedures: NONE  Follow-Up: Your physician wants you to follow-up in: 2 months with Gypsy Balsam, NP You will receive a reminder letter in the mail two months in advance. If you don't receive a letter, please call our office to schedule the follow-up appointment.  Your physician recommends that you schedule a follow-up appointment in: 6 months with Dr. Graciela Husbands.    Any Other Special Instructions Will Be Listed Below (If Applicable).

## 2014-07-26 NOTE — Progress Notes (Signed)
Patient Care Team: Kaleen Mask, MD as PCP - General (Family Medicine)   HPI  Curtis Barton is a 59 y.o. male Seen in followup for atrial flutter for which he underwent catheter ablation while in hospital 9/15.  Echocardiogram at that time demonstrated normal left ventricular function.  CHADS-VASc score was 3 with hypertension and presumed TIAs    Knee replacement surgery 10/16   Over the last couple of months, he has noted significant change in his exercise capacity manifested by dyspnea on exertion and a midsternal-shoulder chest discomfort. These both relieved with rest and/or recurrent with repeated exertion  LHC 4/16 >>50% LAD. He continues to struggle with dyspnea   Cardiac risk factors include hypertension, vascular disease, smoking;  We dont have lipids .   No smoking now  He has symptomatic palpitations deriving from atrial tachycardia. Metoprolol significantly attenuated his palpitations; however, it was associated with fatigue and somnolence. Alternative beta blockers were prescribed at his last visit. He notes problems with palpitations using either Inderal or metoprolol tartrate. We will however been associated with fatigue particularly afternoon and what his wife describes as depression  He also has had elevated blood pressures and blood pressure medications were uptitrated his last visit.  His blood pressure is much improved. At home and is in the 115 range. He has not yet had his sleep study.     Past Medical History  Diagnosis Date  . Hypertension   . Arthritis   . TIA (transient ischemic attack) 2010  . Atrial flutter   . Primary localized osteoarthritis of right knee   . Constipation     severe constipation after past surgeries  . Dysrhythmia 2015    Atrial Flutter; s/p ablation  . Complication of anesthesia     severe constipation;  is resistant to being intubated  . Asthma     as child  . Cancer   . Fuchs' uveitis syndrome    left eye    Past Surgical History  Procedure Laterality Date  . Hernia repair    . Tonsillectomy    . Knee cartilage surgery Bilateral   . Acle repair Right 2000    ACL repair  . Cystoscopy w/ ureteral stent placement  1999  . Rotator cuff repair Right 2005  . Atrial ablation surgery  10/2013    Dr Graciela Husbands  . Eye surgery    . Cataract extraction w/ intraocular lens  implant, bilateral    . Kidney surgery    . Total knee arthroplasty Right 11/26/2013    dr caffrey  . Total knee arthroplasty Right 11/26/2013    Procedure: RIGHT TOTAL KNEE ARTHROPLASTY;  Surgeon: Thera Flake., MD;  Location: [facility] OR;  Service: Orthopedics;  Laterality: Right;  . Atrial flutter ablation N/A 10/13/2013    Procedure: ATRIAL FLUTTER ABLATION;  Surgeon: Duke Salvia, MD;  Location: [facility] CATH LAB;  Service: Cardiovascular;  Laterality: N/A;  . Left heart catheterization with coronary angiogram N/A 05/17/2014    Procedure: LEFT HEART CATHETERIZATION WITH CORONARY ANGIOGRAM;  Surgeon: Corky Crafts, MD;  Location: [facility] CATH LAB;  Service: Cardiovascular;  Laterality: N/A;    Current Outpatient Prescriptions  Medication Sig Dispense Refill  . apixaban (ELIQUIS) 5 MG TABS tablet Take 1 tablet (5 mg total) by mouth 2 (two) times daily. 180 tablet 3  . cyclobenzaprine (FLEXERIL) 10 MG tablet Take 10 mg by mouth 3 (three) times daily as needed for muscle spasms.    Marland Kitchen  lisinopril (PRINIVIL,ZESTRIL) 10 MG tablet Take 1 tablet (10 mg total) by mouth daily. 30 tablet 6  . propranolol ER (INDERAL LA) 60 MG 24 hr capsule Take 60 mg by mouth daily.    . tapentadol (NUCYNTA) 50 MG TABS tablet Take 1-2 tabs by mouthj every 4/6 hours as needed for pain.     No current facility-administered medications for this visit.    Allergies  Allergen Reactions  . Hydrocodone Itching  . Oxycodone Itching  . Codeine Itching    Review of Systems negative except from HPI and PMH  Physical Exam BP 138/94 mmHg  Pulse 57  Ht 6'  4" (1.93 m)  Wt 277 lb 9.6 oz (125.919 kg)  BMI 33.80 kg/m2 Well developed and well nourished in no acute distress HENT normal E scleral and icterus clear Neck Supple JVP flat; carotids brisk and full Clear to ausculation  Regular rate and rhythm, no murmurs gallops or rub Soft with active bowel sounds No clubbing cyanosis  Edema Alert and oriented, grossly normal motor and sensory function Skin Warm and Dry   ECG was ordered today demonstrating sinus rhythm at 57 Intervals 17/09/41 Otherwise normal Assessment and  Plan  Atrial flutter status post ablation   Hypertension  Sleep disordered breathing   Atrial tachycardia-nonsustained  TIA--MRI neg   Dyslipidemia  He has not well-tolerated beta blockers. We'll try him on calcium blockers and is receiving today prescriptions for verapamil and diltiazem both 120 mg daily. We will see him in about 2 months. In the event that symptoms persist, we will need to consider the use of flecainide ry.  He remains appropriate for him to be on a statin. However, until we find the right rhythm controlling drugs I would hold off.  His blood pressure is much improved.  He is scheduled for his sleep study.  He came with his reinforcements today, his wife and his daughter.

## 2014-08-19 ENCOUNTER — Other Ambulatory Visit: Payer: Self-pay | Admitting: Family Medicine

## 2014-08-19 DIAGNOSIS — R1011 Right upper quadrant pain: Secondary | ICD-10-CM

## 2014-08-23 ENCOUNTER — Ambulatory Visit
Admission: RE | Admit: 2014-08-23 | Discharge: 2014-08-23 | Disposition: A | Discharge status: Home / Self Care | Payer: 59 | Source: Ambulatory Visit | Attending: Family Medicine | Admitting: Family Medicine

## 2014-08-23 DIAGNOSIS — R1011 Right upper quadrant pain: Secondary | ICD-10-CM

## 2014-08-30 ENCOUNTER — Encounter: Payer: Self-pay | Admitting: Internal Medicine

## 2014-08-31 ENCOUNTER — Other Ambulatory Visit: Payer: Self-pay | Admitting: *Deleted

## 2014-08-31 NOTE — Telephone Encounter (Addendum)
Called and spoke with patient.  He tells me he stopped CCBs secondary to severe GI side effects:  Severe distention and indigestion, gas, diarrhea.  He stopped CCB and restarted BB (propranolol) himself.  He then stopped BB 2 weeks ago secondary to side effects:  Can't sleep, crashing hard in the afternoon (fatigue), weight gain. States palpitations are aggravating but not disabling him at this time. He is currently taking Lisinopril and Eliquis. Advised that I will review with Dr. Graciela HusbandsKlein about liver concern and Lisinopril.  He has not recently taken his BP since stopping medications.  Agreed that he would check over next few days and I will call him next week to assess before reviewing changes with MD. Will also have scheduler call him to arrange sooner OV w/ Gypsy BalsamAmber Seiler, NP to discuss a new treatment plan.

## 2014-09-04 ENCOUNTER — Encounter: Payer: Self-pay | Admitting: Internal Medicine

## 2014-09-26 ENCOUNTER — Ambulatory Visit: Payer: 59 | Admitting: Nurse Practitioner

## 2014-10-11 ENCOUNTER — Encounter: Payer: Self-pay | Admitting: Nurse Practitioner

## 2014-10-11 NOTE — Progress Notes (Signed)
   Electrophysiology Office Note Date: 10/12/2014  ID:  Curtis Barton, DOB 11/11/1955, MRN [id number]  PCP: ELKINS,WILSON Decoster, MD Electrophysiologist: Klein  CC: follow up on atrial tachycardia  Curtis Barton is a 59 y.o. male seen today for Dr Klein.  He underwent flutter ablation in 10/2013 and has had problems with atrial tachycardia since that time.  He has taken beta blockers which caused issues with fatigue and depression. CCB's caused GI upset.  His palpitations have worsened and have been recently associated with pre-syncope.  He has resumed Atenolol 25mg daily.  Recently, his palpitations have occurred with rest and are concerning because of a feeling of "everythng closing in".  He has stayed adequately hydrated and cannot identify any other triggers.  He also has HTN. BP readings recently at home have been systolic 120's.   He denies chest pain, dyspnea, PND, orthopnea, nausea, vomiting, dizziness, syncope, edema, weight gain, or early satiety.  He has also had sleep study ordered that is scheduled for 10/2014  Past Medical History  Diagnosis Date  . Hypertension   . Arthritis   . TIA (transient ischemic attack) 2010  . Atrial flutter     a. s/p ablation  . Primary localized osteoarthritis of right knee   . Asthma     as child  . Cancer   . Fuchs' uveitis syndrome     left eye   Past Surgical History  Procedure Laterality Date  . Hernia repair    . Tonsillectomy    . Knee cartilage surgery Bilateral   . Acle repair Right 2000    ACL repair  . Cystoscopy w/ ureteral stent placement  1999  . Rotator cuff repair Right 2005  . Atrial ablation surgery  10/2013    Dr Klein  . Eye surgery    . Cataract extraction w/ intraocular lens  implant, bilateral    . Kidney surgery    . Total knee arthroplasty Right 11/26/2013    dr caffrey  . Total knee arthroplasty Right 11/26/2013    Procedure: RIGHT TOTAL KNEE ARTHROPLASTY;  Surgeon: W D Caffrey Jr., MD;  Location:  [facility] OR;  Service: Orthopedics;  Laterality: Right;  . Atrial flutter ablation N/A 10/13/2013    Procedure: ATRIAL FLUTTER ABLATION;  Surgeon: Steven C Klein, MD;  Location: [facility] CATH LAB;  Service: Cardiovascular;  Laterality: N/A;  . Left heart catheterization with coronary angiogram N/A 05/17/2014    Procedure: LEFT HEART CATHETERIZATION WITH CORONARY ANGIOGRAM;  Surgeon: Jayadeep S Varanasi, MD;  Location: [facility] CATH LAB;  Service: Cardiovascular;  Laterality: N/A;    Current Outpatient Prescriptions  Medication Sig Dispense Refill  . apixaban (ELIQUIS) 5 MG TABS tablet Take 1 tablet (5 mg total) by mouth 2 (two) times daily. 180 tablet 3  . cyclobenzaprine (FLEXERIL) 10 MG tablet Take 10 mg by mouth 3 (three) times daily as needed for muscle spasms.    . tapentadol (NUCYNTA) 50 MG TABS tablet Take 1-2 tabs by mouthj every 4/6 hours as needed for pain.    . atenolol (TENORMIN) 25 MG tablet Take 1 tablet (25 mg total) by mouth daily. 30 tablet 3   No current facility-administered medications for this visit.    Allergies:   Hydrocodone; Oxycodone; and Codeine   Social History: Social History   Social History  . Marital Status: Married    Spouse Name: N/A  . Number of Children: N/A  . Years of Education: N/A   Occupational History  .   Not on file.   Social History Main Topics  . Smoking status: Former Smoker -- 1.00 packs/day    Types: Cigarettes    Quit date: 10/12/2013  . Smokeless tobacco: Never Used  . Alcohol Use: No  . Drug Use: No  . Sexual Activity: Not on file   Other Topics Concern  . Not on file   Social History Narrative    Family History: Family History  Problem Relation Age of Onset  . Heart disease    . Hypertension    . Cancer    . Heart disease Mother   . Cancer Father     Review of Systems: General: No chills, fever, night sweats or weight changes  Cardiovascular:  No chest pain, dyspnea on exertion, edema, orthopnea, palpitations, paroxysmal nocturnal  dyspnea  Dermatological: No rash, lesions or masses Respiratory: No cough, dyspnea Urologic: No hematuria, dysuria Abdominal: No nausea, vomiting, diarrhea, bright red blood per rectum, melena, or hematemesis Neurologic: No visual changes, weakness, changes in mental status All other systems reviewed and are otherwise negative except as noted above.   Physical Exam: VS:  BP 122/78 mmHg  Pulse 74  Ht 6' 4" (1.93 m)  Wt 281 lb 12 oz (127.801 kg)  BMI 34.31 kg/m2 , BMI Body mass index is 34.31 kg/(m^2). Wt Readings from Last 3 Encounters:  10/12/14 281 lb 12 oz (127.801 kg)  07/26/14 277 lb 9.6 oz (125.919 kg)  06/06/14 277 lb 6.4 oz (125.828 kg)    GEN- The patient is obese appearing, alert and oriented x 3 today.   HEENT: normocephalic, atraumatic; sclera clear, conjunctiva pink; hearing intact; oropharynx clear; neck supple Lungs- Clear to ausculation bilaterally, normal work of breathing.  No wheezes, rales, rhonchi Heart- Regular rate and rhythm GI- soft, non-tender, non-distended, bowel sounds present, no hepatosplenomegaly Extremities- no clubbing, cyanosis, 1+ BLE edema; DP/PT/radial pulses 2+ bilaterally MS- no significant deformity or atrophy Skin- warm and dry, no rash or lesion  Psych- euthymic mood, full affect Neuro- strength and sensation are intact   EKG:  EKG is ordered today. The ekg ordered today shows sinus rhythm, rate 71, RSR'  Recent Labs: 10/13/2013: Magnesium 2.1; TSH 1.210 11/17/2013: ALT 19 05/16/2014: BUN 12; Creatinine, Ser 0.90; Hemoglobin 14.7; Platelets 226.0; Potassium 3.7; Sodium 136    Other studies Reviewed: Additional studies/ records that were reviewed today include: Dr Klein's office notes, op notes  Assessment and Plan: 1.  Atrial arrhythmias The patient presented with atrial flutter and is s/p ablation.  He has had no documented recurrent atrial flutter.  He has had atrial tachycardia that has been treated with BB and CCB but these were  unable to be tolerated 2/2 side effects at higher doses. He has developed worsening palpitations associated with pre-syncope.  He has worn an event monitor previously, but he did not have spells like he has been having recently.  I think that with the concern for development of atrial fibrillation and need for better clarifying palpitations, an implantable loop recorder is reasonable. Risks, benefits discussed with the patient who wishes to proceed.   He remains on Eliquis for CHADS2VASC of 3, but has not had recent documented atrial flutter or atrial fibrillation.   2.  Daytime somnolence/fatigue Sleep study scheduled for 10/2014  3.  Obesity Weight loss will be important for his prognosis long-term Discussed with the patient today  4.  HTN Stable He is concerned about continuing Lisinopril - will discontinue and follow BP's at home.    If SBP starts to average >140, advised the patient to increase Atenolol to 50mg daily.    Current medicines are reviewed at length with the patient today.   The patient does not have concerns regarding his medicines.  The following changes were made today:  Stop Lisinopril  Labs/ tests ordered today include:  Orders Placed This Encounter  Procedures  . EKG 12-Lead     Disposition:   Follow up with Dr Klein after ILR implant   Signed, Amber Seiler, NP 10/12/2014 2:22 PM   [facility] [address] [place] [address] [phone] (office) [phone] (fax)   

## 2014-10-12 ENCOUNTER — Encounter: Payer: Self-pay | Admitting: Nurse Practitioner

## 2014-10-12 ENCOUNTER — Encounter (HOSPITAL_COMMUNITY)
Admission: AD | Disposition: A | Discharge status: Home / Self Care | Payer: Self-pay | Source: Ambulatory Visit | Attending: Internal Medicine

## 2014-10-12 ENCOUNTER — Ambulatory Visit (INDEPENDENT_AMBULATORY_CARE_PROVIDER_SITE_OTHER): Payer: 59 | Admitting: Nurse Practitioner

## 2014-10-12 ENCOUNTER — Ambulatory Visit (HOSPITAL_COMMUNITY)
Admission: AD | Admit: 2014-10-12 | Discharge: 2014-10-12 | Disposition: A | Discharge status: Home / Self Care | Payer: 59 | Source: Ambulatory Visit | Attending: Internal Medicine | Admitting: Internal Medicine

## 2014-10-12 ENCOUNTER — Other Ambulatory Visit: Payer: Self-pay | Admitting: Nurse Practitioner

## 2014-10-12 VITALS — BP 122/78 | HR 74 | Ht 76.0 in | Wt 281.8 lb

## 2014-10-12 DIAGNOSIS — E669 Obesity, unspecified: Secondary | ICD-10-CM | POA: Insufficient documentation

## 2014-10-12 DIAGNOSIS — R55 Syncope and collapse: Secondary | ICD-10-CM

## 2014-10-12 DIAGNOSIS — M1711 Unilateral primary osteoarthritis, right knee: Secondary | ICD-10-CM | POA: Insufficient documentation

## 2014-10-12 DIAGNOSIS — Z6834 Body mass index (BMI) 34.0-34.9, adult: Secondary | ICD-10-CM | POA: Diagnosis not present

## 2014-10-12 DIAGNOSIS — I10 Essential (primary) hypertension: Secondary | ICD-10-CM | POA: Diagnosis not present

## 2014-10-12 DIAGNOSIS — I4719 Other supraventricular tachycardia: Secondary | ICD-10-CM

## 2014-10-12 DIAGNOSIS — I471 Supraventricular tachycardia: Secondary | ICD-10-CM | POA: Insufficient documentation

## 2014-10-12 DIAGNOSIS — Z8673 Personal history of transient ischemic attack (TIA), and cerebral infarction without residual deficits: Secondary | ICD-10-CM | POA: Insufficient documentation

## 2014-10-12 DIAGNOSIS — I483 Typical atrial flutter: Secondary | ICD-10-CM

## 2014-10-12 DIAGNOSIS — R002 Palpitations: Secondary | ICD-10-CM

## 2014-10-12 DIAGNOSIS — Z87891 Personal history of nicotine dependence: Secondary | ICD-10-CM | POA: Diagnosis not present

## 2014-10-12 DIAGNOSIS — Z7901 Long term (current) use of anticoagulants: Secondary | ICD-10-CM | POA: Insufficient documentation

## 2014-10-12 HISTORY — PX: EP IMPLANTABLE DEVICE: SHX172B

## 2014-10-12 SURGERY — LOOP RECORDER INSERTION
Anesthesia: LOCAL

## 2014-10-12 MED ORDER — LIDOCAINE-EPINEPHRINE 1 %-1:100000 IJ SOLN
INTRAMUSCULAR | Status: AC
  Filled 2014-10-12: qty 1

## 2014-10-12 MED ORDER — ATENOLOL 25 MG PO TABS: 25.0000 mg | ORAL_TABLET | Freq: Every day | ORAL | Status: DC

## 2014-10-12 MED ORDER — LIDOCAINE-EPINEPHRINE 1 %-1:100000 IJ SOLN
INTRAMUSCULAR | Status: DC | PRN
  Administered 2014-10-12: 20 mL

## 2014-10-12 SURGICAL SUPPLY — 2 items
LOOP REVEAL LINQSYS (Prosthesis & Implant Heart) ×1 IMPLANT
PACK LOOP INSERTION (CUSTOM PROCEDURE TRAY) ×2 IMPLANT

## 2014-10-12 NOTE — Progress Notes (Signed)
Loop procedure completed. Pt stable. Pt given discharge instructions and verbalizes understanding and denies further questions.  Site unremarkable.

## 2014-10-12 NOTE — H&P (View-Only) (Signed)
Electrophysiology Office Note Date: 10/12/2014  ID:  Curtis Barton, Curtis Barton 1955-03-12, MRN [id number]  PCP: Kaleen Mask, MD Electrophysiologist: Graciela Husbands  CC: follow up on atrial tachycardia  Curtis Barton is a 59 y.o. male seen today for Dr Graciela Husbands.  He underwent flutter ablation in 10/2013 and has had problems with atrial tachycardia since that time.  He has taken beta blockers which caused issues with fatigue and depression. CCB's caused GI upset.  His palpitations have worsened and have been recently associated with pre-syncope.  He has resumed Atenolol  daily.  Recently, his palpitations have occurred with rest and are concerning because of a feeling of "everythng closing in".  He has stayed adequately hydrated and cannot identify any other triggers.  He also has HTN. BP readings recently at home have been systolic 120's.   He denies chest pain, dyspnea, PND, orthopnea, nausea, vomiting, dizziness, syncope, edema, weight gain, or early satiety.  He has also had sleep study ordered that is scheduled for 10/2014  Past Medical History  Diagnosis Date  . Hypertension   . Arthritis   . TIA (transient ischemic attack) 2010  . Atrial flutter     a. s/p ablation  . Primary localized osteoarthritis of right knee   . Asthma     as child  . Cancer   . Fuchs' uveitis syndrome     left eye   Past Surgical History  Procedure Laterality Date  . Hernia repair    . Tonsillectomy    . Knee cartilage surgery Bilateral   . Acle repair Right 2000    ACL repair  . Cystoscopy w/ ureteral stent placement  1999  . Rotator cuff repair Right 2005  . Atrial ablation surgery  10/2013    Dr Graciela Husbands  . Eye surgery    . Cataract extraction w/ intraocular lens  implant, bilateral    . Kidney surgery    . Total knee arthroplasty Right 11/26/2013    dr caffrey  . Total knee arthroplasty Right 11/26/2013    Procedure: RIGHT TOTAL KNEE ARTHROPLASTY;  Surgeon: Thera Flake., MD;  Location:  [facility] OR;  Service: Orthopedics;  Laterality: Right;  . Atrial flutter ablation N/A 10/13/2013    Procedure: ATRIAL FLUTTER ABLATION;  Surgeon: Duke Salvia, MD;  Location: [facility] CATH LAB;  Service: Cardiovascular;  Laterality: N/A;  . Left heart catheterization with coronary angiogram N/A 05/17/2014    Procedure: LEFT HEART CATHETERIZATION WITH CORONARY ANGIOGRAM;  Surgeon: Corky Crafts, MD;  Location: [facility] CATH LAB;  Service: Cardiovascular;  Laterality: N/A;    Current Outpatient Prescriptions  Medication Sig Dispense Refill  . apixaban (ELIQUIS) 5 MG TABS tablet Take 1 tablet (5 mg total) by mouth 2 (two) times daily. 180 tablet 3  . cyclobenzaprine (FLEXERIL) 10 MG tablet Take 10 mg by mouth 3 (three) times daily as needed for muscle spasms.    . tapentadol (NUCYNTA) 50 MG TABS tablet Take 1-2 tabs by mouthj every 4/6 hours as needed for pain.    Marland Kitchen atenolol (TENORMIN) 25 MG tablet Take 1 tablet (25 mg total) by mouth daily. 30 tablet 3   No current facility-administered medications for this visit.    Allergies:   Hydrocodone; Oxycodone; and Codeine   Social History: Social History   Social History  . Marital Status: Married    Spouse Name: N/A  . Number of Children: N/A  . Years of Education: N/A   Occupational History  .  Not on file.   Social History Main Topics  . Smoking status: Former Smoker -- 1.00 packs/day    Types: Cigarettes    Quit date: 10/12/2013  . Smokeless tobacco: Never Used  . Alcohol Use: No  . Drug Use: No  . Sexual Activity: Not on file   Other Topics Concern  . Not on file   Social History Narrative    Family History: Family History  Problem Relation Age of Onset  . Heart disease    . Hypertension    . Cancer    . Heart disease Mother   . Cancer Father     Review of Systems: General: No chills, fever, night sweats or weight changes  Cardiovascular:  No chest pain, dyspnea on exertion, edema, orthopnea, palpitations, paroxysmal nocturnal  dyspnea  Dermatological: No rash, lesions or masses Respiratory: No cough, dyspnea Urologic: No hematuria, dysuria Abdominal: No nausea, vomiting, diarrhea, bright red blood per rectum, melena, or hematemesis Neurologic: No visual changes, weakness, changes in mental status All other systems reviewed and are otherwise negative except as noted above.   Physical Exam: VS:  BP 122/78 mmHg  Pulse 74  Ht 6\' 4"  (1.93 m)  Wt 281 lb 12 oz (127.801 kg)  BMI 34.31 kg/m2 , BMI Body mass index is 34.31 kg/(m^2). Wt Readings from Last 3 Encounters:  10/12/14 281 lb 12 oz (127.801 kg)  07/26/14 277 lb 9.6 oz (125.919 kg)  06/06/14 277 lb 6.4 oz (125.828 kg)    GEN- The patient is obese appearing, alert and oriented x 3 today.   HEENT: normocephalic, atraumatic; sclera clear, conjunctiva pink; hearing intact; oropharynx clear; neck supple Lungs- Clear to ausculation bilaterally, normal work of breathing.  No wheezes, rales, rhonchi Heart- Regular rate and rhythm GI- soft, non-tender, non-distended, bowel sounds present, no hepatosplenomegaly Extremities- no clubbing, cyanosis, 1+ BLE edema; DP/PT/radial pulses 2+ bilaterally MS- no significant deformity or atrophy Skin- warm and dry, no rash or lesion  Psych- euthymic mood, full affect Neuro- strength and sensation are intact   EKG:  EKG is ordered today. The ekg ordered today shows sinus rhythm, rate 71, RSR'  Recent Labs: 10/13/2013: Magnesium 2.1; TSH 1.210 11/17/2013: ALT 19 05/16/2014: BUN 12; Creatinine, Ser 0.90; Hemoglobin 14.7; Platelets 226.0; Potassium 3.7; Sodium 136    Other studies Reviewed: Additional studies/ records that were reviewed today include: Dr Odessa Fleming office notes, op notes  Assessment and Plan: 1.  Atrial arrhythmias The patient presented with atrial flutter and is s/p ablation.  He has had no documented recurrent atrial flutter.  He has had atrial tachycardia that has been treated with BB and CCB but these were  unable to be tolerated 2/2 side effects at higher doses. He has developed worsening palpitations associated with pre-syncope.  He has worn an event monitor previously, but he did not have spells like he has been having recently.  I think that with the concern for development of atrial fibrillation and need for better clarifying palpitations, an implantable loop recorder is reasonable. Risks, benefits discussed with the patient who wishes to proceed.   He remains on Eliquis for CHADS2VASC of 3, but has not had recent documented atrial flutter or atrial fibrillation.   2.  Daytime somnolence/fatigue Sleep study scheduled for 10/2014  3.  Obesity Weight loss will be important for his prognosis long-term Discussed with the patient today  4.  HTN Stable He is concerned about continuing Lisinopril - will discontinue and follow BP's at home.  If SBP starts to average >140, advised the patient to increase Atenolol to 50mg  daily.    Current medicines are reviewed at length with the patient today.   The patient does not have concerns regarding his medicines.  The following changes were made today:  Stop Lisinopril  Labs/ tests ordered today include:  Orders Placed This Encounter  Procedures  . EKG 12-Lead     Disposition:   Follow up with Dr Graciela Husbands after ILR implant   Signed, Gypsy Balsam, NP 10/12/2014 2:22 PM   [facility] [address] [place] [address] [phone] (office) [phone] (fax)

## 2014-10-12 NOTE — Interval H&P Note (Signed)
History and Physical Interval Note:  10/12/2014 4:59 PM  Curtis Barton  has presented today for surgery, with the diagnosis of palpitations  The various methods of treatment have been discussed with the patient and family. After consideration of risks, benefits and other options for treatment, the patient has consented to  Procedure(s): Loop Recorder Insertion (N/A) as a surgical intervention .  The patient's history has been reviewed, patient examined, no change in status, stable for surgery.  I have reviewed the patient's chart and labs.  Questions were answered to the patient's satisfaction.     Sherryl Manges  History reveiwed pt examined

## 2014-10-12 NOTE — Patient Instructions (Signed)
Medication Instructions:  Your physician has recommended you make the following change in your medication:  1. STOP Lisinopril  2. Atenolol ( 25 mg ) daily, in bp > 140 take extra dose of medication.   Labwork: -None  Testing/Procedures: Patient is getting loop recorder placed today by Dr. Graciela Husbands  Follow-Up: 10 day wound check Your physician recommends that you scheduled  follow-up appointment with Dr. Graciela Husbands   Any Other Special Instructions Will Be Listed Below (If Applicable).

## 2014-10-13 ENCOUNTER — Encounter (HOSPITAL_COMMUNITY): Payer: Self-pay | Admitting: Internal Medicine

## 2014-10-19 ENCOUNTER — Telehealth: Payer: Self-pay | Admitting: Internal Medicine

## 2014-10-19 ENCOUNTER — Encounter (HOSPITAL_BASED_OUTPATIENT_CLINIC_OR_DEPARTMENT_OTHER): Payer: 59

## 2014-10-19 NOTE — Telephone Encounter (Signed)
New message     Josh the PA is calling needing clarification on the clearance for pt to have surgery.  Please call

## 2014-10-20 ENCOUNTER — Encounter: Payer: Self-pay | Admitting: Internal Medicine

## 2014-10-20 ENCOUNTER — Ambulatory Visit (INDEPENDENT_AMBULATORY_CARE_PROVIDER_SITE_OTHER): Payer: 59 | Admitting: *Deleted

## 2014-10-20 DIAGNOSIS — G459 Transient cerebral ischemic attack, unspecified: Secondary | ICD-10-CM

## 2014-10-20 DIAGNOSIS — I471 Supraventricular tachycardia: Secondary | ICD-10-CM

## 2014-10-20 DIAGNOSIS — I483 Typical atrial flutter: Secondary | ICD-10-CM

## 2014-10-20 LAB — CUP PACEART INCLINIC DEVICE CHECK
Date Time Interrogation Session: 20160908173204
MDC IDC SET ZONE DETECTION INTERVAL: 3000 ms
Zone Setting Detection Interval: 2000 ms
Zone Setting Detection Interval: 350 ms

## 2014-10-20 NOTE — Telephone Encounter (Signed)
Informed Curtis Barton that Dr. Graciela Husbands does feel recent issues will be problematic for upcoming total hip. Also informed, if needed - ok to stop Eliquis 3-4 days prior to procedure.

## 2014-10-20 NOTE — Progress Notes (Signed)
Wound check in clinic s/p ILR implant.  Wound well healed without redness or edema. Pt with 0 tachy episodes; 0 brady episodes; 0 asystole; 0 AF episodes; (2) symptom episodes---"hard heartbeat" per pt.  Plan to follow up via [facility] and with SK in 3 months.

## 2014-10-26 ENCOUNTER — Telehealth: Payer: Self-pay | Admitting: *Deleted

## 2014-10-26 NOTE — Telephone Encounter (Signed)
Patient left a voicemail on the refill line stating that Dr Graciela Husbands had changed his atenolol to  bid. I do not see documentation in regards to this, I only see here Curtis Barton ordered Atenolol ( 25 mg ) daily, in bp > 140 take extra dose of medication. Please advise. Thanks, MI

## 2014-10-28 ENCOUNTER — Encounter: Payer: Self-pay | Admitting: Nurse Practitioner

## 2014-10-28 MED ORDER — ATENOLOL 25 MG PO TABS: ORAL_TABLET | ORAL | Status: DC

## 2014-10-28 NOTE — Telephone Encounter (Signed)
     -----   Message -----     From: Corrinne Eagle     Sent: 10/28/2014 10:12 AM      To: [facility]    Subject: Visit Follow-Up Question                   My BP has been staying above 140 so as directed I have been taking the Atenolol 25 mg twice daily. On Monday I requested a refill. On Tuesday I was told my insurance dis allowed for "too soon". I called your office and left message. Yesterday I called again and spoke with a live person who stated she had sent them a script for what they already have. This did not solve the problem. She also said she would send a note to either you or Dr Graciela Husbands for direction and or to call me. I have not had any response. Please clarify what I should do.        Thanks        Curtis Barton    [phone]    [email or web address]  (this is a different email I use on phone)      The above was received from a My Chart message from the patient. I will refill his atenolol for 25 mg BID as directed.

## 2014-10-28 NOTE — Telephone Encounter (Signed)
My Chart message sent back to the patient I have re-sent his RX for atenolol 25 mg BID as directed # 60 w/ refills. I have asked him to call back with any further questions.

## 2014-11-02 ENCOUNTER — Telehealth: Payer: Self-pay | Admitting: Cardiology

## 2014-11-02 DIAGNOSIS — G4733 Obstructive sleep apnea (adult) (pediatric): Secondary | ICD-10-CM

## 2014-11-02 NOTE — Telephone Encounter (Signed)
Please let patient know that he has mild OSA with oxygen desaturations on home sleep study.  Given his oxygen desaturations along with atrial arrhythmias and increased Epworth sleepiness scale, recommended proceeding with inpatient CPAP titration

## 2014-11-03 NOTE — Telephone Encounter (Signed)
Patient is aware of results. Stated verbal understanding.   Will send information to precert then schedule titration.  Patient is having surgery in October, so he will need to be scheduled after that.

## 2014-11-10 ENCOUNTER — Ambulatory Visit: Payer: Self-pay | Admitting: Physician Assistant

## 2014-11-10 NOTE — H&P (Signed)
TOTAL HIP ADMISSION H&P  Patient is admitted for right total hip arthroplasty.  Subjective:  Chief Complaint: right hip pain  HPI: Curtis Barton, 59 y.o. male, has a history of pain and functional disability in the right hip(s) due to arthritis and patient has failed non-surgical conservative treatments for greater than 12 weeks to include NSAID's and/or analgesics, corticosteriod injections, use of assistive devices and activity modification.  Onset of symptoms was gradual starting 3 years ago with gradually worsening course since that time.The patient noted no past surgery on the right hip(s).  Patient currently rates pain in the right hip at 8 out of 10 with activity. Patient has night pain, worsening of pain with activity and weight bearing, trendelenberg gait, pain that interfers with activities of daily living, pain with passive range of motion, crepitus and joint swelling. Patient has evidence of periarticular osteophytes and joint space narrowing by imaging studies. This condition presents safety issues increasing the risk of falls.  There is no current active infection.  Patient Active Problem List   Diagnosis Date Noted  . Palpitations 10/12/2014  . Near syncope 10/12/2014  . Angina pectoris   . Essential hypertension   . Osteoarthritis of right knee 11/26/2013  . History of tobacco abuse 11/15/2013  . Preoperative cardiovascular examination 11/15/2013  . Primary localized osteoarthritis of right knee 11/03/2013  . Hypertension   . Arthritis   . TIA (transient ischemic attack)   . Atrial flutter 10/12/2013   Past Medical History  Diagnosis Date  . Hypertension   . Arthritis   . TIA (transient ischemic attack) 2010  . Atrial flutter     a. s/p ablation  . Primary localized osteoarthritis of right knee   . Asthma     as child  . Cancer   . Fuchs' uveitis syndrome     left eye    Past Surgical History  Procedure Laterality Date  . Hernia repair    . Tonsillectomy     . Knee cartilage surgery Bilateral   . Acle repair Right 2000    ACL repair  . Cystoscopy w/ ureteral stent placement  1999  . Rotator cuff repair Right 2005  . Atrial ablation surgery  10/2013    Dr Graciela Husbands  . Eye surgery    . Cataract extraction w/ intraocular lens  implant, bilateral    . Kidney surgery    . Total knee arthroplasty Right 11/26/2013    dr caffrey  . Total knee arthroplasty Right 11/26/2013    Procedure: RIGHT TOTAL KNEE ARTHROPLASTY;  Surgeon: Thera Flake., MD;  Location: [facility] OR;  Service: Orthopedics;  Laterality: Right;  . Atrial flutter ablation N/A 10/13/2013    Procedure: ATRIAL FLUTTER ABLATION;  Surgeon: Duke Salvia, MD;  Location: [facility] CATH LAB;  Service: Cardiovascular;  Laterality: N/A;  . Left heart catheterization with coronary angiogram N/A 05/17/2014    Procedure: LEFT HEART CATHETERIZATION WITH CORONARY ANGIOGRAM;  Surgeon: Corky Crafts, MD;  Location: [facility] CATH LAB;  Service: Cardiovascular;  Laterality: N/A;  . Ep implantable device N/A 10/12/2014    Procedure: Loop Recorder Insertion;  Surgeon: Duke Salvia, MD;  Location: [facility] INVASIVE CV LAB;  Service: Cardiovascular;  Laterality: N/A;     (Not in a hospital admission) Allergies  Allergen Reactions  . Hydrocodone Itching  . Oxycodone Itching  . Codeine Itching    Social History  Substance Use Topics  . Smoking status: Former Smoker -- 1.00 packs/day  Types: Cigarettes    Quit date: 10/12/2013  . Smokeless tobacco: Never Used  . Alcohol Use: No    Family History  Problem Relation Age of Onset  . Heart disease    . Hypertension    . Cancer    . Heart disease Mother   . Cancer Father      Review of Systems  Constitutional: Negative.   HENT: Negative.   Eyes: Negative.   Respiratory: Negative.   Cardiovascular: Positive for palpitations and leg swelling.  Gastrointestinal: Negative.   Genitourinary: Negative.   Musculoskeletal: Positive for joint pain.  Skin: Negative.    Neurological: Positive for dizziness.  Endo/Heme/Allergies: Negative.   Psychiatric/Behavioral: Negative.     Objective:  Physical Exam  Constitutional: He is oriented to person, place, and time. He appears well-developed and well-nourished. No distress.  HENT:  Head: Normocephalic and atraumatic.  Nose: Nose normal.  Eyes: Conjunctivae and EOM are normal. Pupils are equal, round, and reactive to light.  Neck: Normal range of motion. Neck supple.  Cardiovascular: Normal rate, regular rhythm, normal heart sounds and intact distal pulses.   Respiratory: Effort normal and breath sounds normal. No respiratory distress.  GI: Soft. Bowel sounds are normal. He exhibits no distension. There is no tenderness.  Musculoskeletal:       Right hip: He exhibits decreased range of motion, tenderness and bony tenderness.  Lymphadenopathy:    He has no cervical adenopathy.  Neurological: He is alert and oriented to person, place, and time. No cranial nerve deficit.  Skin: Skin is warm and dry. No rash noted. No erythema.  Psychiatric: He has a normal mood and affect. His behavior is normal.    Vital signs in last 24 hours: @VSRANGES @  Labs:   Estimated body mass index is 34.31 kg/(m^2) as calculated from the following:   Height as of 10/12/14: 6\' 4"  (1.93 m).   Weight as of 10/12/14: 127.801 kg (281 lb 12 oz).   Imaging Review Plain radiographs demonstrate moderate degenerative joint disease of the right hip(s). The bone quality appears to be good for age and reported activity level.  Assessment/Plan:  End stage arthritis, right hip(s)  The patient history, physical examination, clinical judgement of the provider and imaging studies are consistent with end stage degenerative joint disease of the right hip(s) and total hip arthroplasty is deemed medically necessary. The treatment options including medical management, injection therapy, arthroscopy and arthroplasty were discussed at length.  The risks and benefits of total hip arthroplasty were presented and reviewed. The risks due to aseptic loosening, infection, stiffness, dislocation/subluxation,  thromboembolic complications and other imponderables were discussed.  The patient acknowledged the explanation, agreed to proceed with the plan and consent was signed. Patient is being admitted for inpatient treatment for surgery, pain control, PT, OT, prophylactic antibiotics, VTE prophylaxis, progressive ambulation and ADL's and discharge planning.The patient is planning to be discharged home with home health services

## 2014-11-11 ENCOUNTER — Encounter: Payer: Self-pay | Admitting: Internal Medicine

## 2014-11-11 ENCOUNTER — Ambulatory Visit (INDEPENDENT_AMBULATORY_CARE_PROVIDER_SITE_OTHER): Payer: 59 | Admitting: *Deleted

## 2014-11-11 DIAGNOSIS — R55 Syncope and collapse: Secondary | ICD-10-CM

## 2014-11-14 ENCOUNTER — Other Ambulatory Visit: Payer: Self-pay | Admitting: Family Medicine

## 2014-11-14 DIAGNOSIS — Z01818 Encounter for other preprocedural examination: Secondary | ICD-10-CM

## 2014-11-14 DIAGNOSIS — R1915 Other abnormal bowel sounds: Secondary | ICD-10-CM

## 2014-11-15 NOTE — Progress Notes (Signed)
Loop recorder 

## 2014-11-17 ENCOUNTER — Ambulatory Visit
Admission: RE | Admit: 2014-11-17 | Discharge: 2014-11-17 | Disposition: A | Discharge status: Home / Self Care | Payer: 59 | Source: Ambulatory Visit | Attending: Family Medicine | Admitting: Family Medicine

## 2014-11-17 DIAGNOSIS — R1915 Other abnormal bowel sounds: Secondary | ICD-10-CM

## 2014-11-17 DIAGNOSIS — Z01818 Encounter for other preprocedural examination: Secondary | ICD-10-CM

## 2014-11-17 MED ORDER — IOPAMIDOL (ISOVUE-300) INJECTION 61%
125.0000 mL | Freq: Once | INTRAVENOUS | Status: AC | PRN
  Administered 2014-11-17: 125 mL via INTRAVENOUS

## 2014-11-21 ENCOUNTER — Encounter (HOSPITAL_COMMUNITY): Payer: Self-pay

## 2014-11-21 ENCOUNTER — Encounter (HOSPITAL_COMMUNITY)
Admission: RE | Admit: 2014-11-21 | Discharge: 2014-11-21 | Disposition: A | Discharge status: Home / Self Care | Payer: 59 | Source: Ambulatory Visit | Attending: Orthopedic Surgery | Admitting: Orthopedic Surgery

## 2014-11-21 DIAGNOSIS — Z7902 Long term (current) use of antithrombotics/antiplatelets: Secondary | ICD-10-CM | POA: Insufficient documentation

## 2014-11-21 DIAGNOSIS — Z87891 Personal history of nicotine dependence: Secondary | ICD-10-CM | POA: Diagnosis not present

## 2014-11-21 DIAGNOSIS — Z01818 Encounter for other preprocedural examination: Secondary | ICD-10-CM | POA: Diagnosis not present

## 2014-11-21 DIAGNOSIS — Z79899 Other long term (current) drug therapy: Secondary | ICD-10-CM | POA: Insufficient documentation

## 2014-11-21 DIAGNOSIS — J45909 Unspecified asthma, uncomplicated: Secondary | ICD-10-CM | POA: Diagnosis not present

## 2014-11-21 DIAGNOSIS — Z96651 Presence of right artificial knee joint: Secondary | ICD-10-CM | POA: Insufficient documentation

## 2014-11-21 DIAGNOSIS — G4733 Obstructive sleep apnea (adult) (pediatric): Secondary | ICD-10-CM | POA: Diagnosis not present

## 2014-11-21 DIAGNOSIS — Z0183 Encounter for blood typing: Secondary | ICD-10-CM | POA: Diagnosis not present

## 2014-11-21 DIAGNOSIS — Z01812 Encounter for preprocedural laboratory examination: Secondary | ICD-10-CM | POA: Insufficient documentation

## 2014-11-21 DIAGNOSIS — I10 Essential (primary) hypertension: Secondary | ICD-10-CM | POA: Diagnosis not present

## 2014-11-21 DIAGNOSIS — M1611 Unilateral primary osteoarthritis, right hip: Secondary | ICD-10-CM | POA: Insufficient documentation

## 2014-11-21 HISTORY — DX: Failed or difficult intubation, initial encounter: T88.4XXA

## 2014-11-21 HISTORY — DX: Sleep apnea, unspecified: G47.30

## 2014-11-21 HISTORY — DX: Cerebral infarction, unspecified: I63.9

## 2014-11-21 LAB — COMPREHENSIVE METABOLIC PANEL
ALT: 22 U/L (ref 17–63)
AST: 19 U/L (ref 15–41)
Albumin: 3.7 g/dL (ref 3.5–5.0)
Alkaline Phosphatase: 53 U/L (ref 38–126)
Anion gap: 6 (ref 5–15)
BUN: 11 mg/dL (ref 6–20)
CHLORIDE: 106 mmol/L (ref 101–111)
CO2: 28 mmol/L (ref 22–32)
CREATININE: 1.1 mg/dL (ref 0.61–1.24)
Calcium: 9.4 mg/dL (ref 8.9–10.3)
Glucose, Bld: 90 mg/dL (ref 65–99)
POTASSIUM: 4.7 mmol/L (ref 3.5–5.1)
SODIUM: 140 mmol/L (ref 135–145)
Total Bilirubin: 0.6 mg/dL (ref 0.3–1.2)
Total Protein: 7.1 g/dL (ref 6.5–8.1)

## 2014-11-21 LAB — URINALYSIS, ROUTINE W REFLEX MICROSCOPIC
BILIRUBIN URINE: NEGATIVE
GLUCOSE, UA: NEGATIVE mg/dL
Hgb urine dipstick: NEGATIVE
KETONES UR: NEGATIVE mg/dL
LEUKOCYTES UA: NEGATIVE
Nitrite: NEGATIVE
PH: 6 (ref 5.0–8.0)
PROTEIN: NEGATIVE mg/dL
Specific Gravity, Urine: 1.007 (ref 1.005–1.030)
Urobilinogen, UA: 0.2 mg/dL (ref 0.0–1.0)

## 2014-11-21 LAB — CBC WITH DIFFERENTIAL/PLATELET
BASOS ABS: 0 10*3/uL (ref 0.0–0.1)
Basophils Relative: 0 %
EOS ABS: 0.3 10*3/uL (ref 0.0–0.7)
EOS PCT: 4 %
HCT: 42.2 % (ref 39.0–52.0)
Hemoglobin: 14.2 g/dL (ref 13.0–17.0)
Lymphocytes Relative: 37 %
Lymphs Abs: 2.7 10*3/uL (ref 0.7–4.0)
MCH: 32.5 pg (ref 26.0–34.0)
MCHC: 33.6 g/dL (ref 30.0–36.0)
MCV: 96.6 fL (ref 78.0–100.0)
MONO ABS: 0.5 10*3/uL (ref 0.1–1.0)
Monocytes Relative: 7 %
Neutro Abs: 3.8 10*3/uL (ref 1.7–7.7)
Neutrophils Relative %: 52 %
PLATELETS: 193 10*3/uL (ref 150–400)
RBC: 4.37 MIL/uL (ref 4.22–5.81)
RDW: 13 % (ref 11.5–15.5)
WBC: 7.3 10*3/uL (ref 4.0–10.5)

## 2014-11-21 LAB — SURGICAL PCR SCREEN
MRSA, PCR: NEGATIVE
Staphylococcus aureus: NEGATIVE

## 2014-11-21 LAB — TYPE AND SCREEN
ABO/RH(D): A NEG
ANTIBODY SCREEN: NEGATIVE

## 2014-11-21 LAB — PROTIME-INR
INR: 1.27 (ref 0.00–1.49)
PROTHROMBIN TIME: 16 s — AB (ref 11.6–15.2)

## 2014-11-21 LAB — APTT: APTT: 34 s (ref 24–37)

## 2014-11-21 NOTE — Progress Notes (Signed)
PCP is Dr. Jeannetta Nap Cardiologist is Dr Jearld Fenton noted from 10-13-2013 Card cath noted from 05-17-14 Sleep study done- but pt needs inpatient study- see note from Armanda Magic, MD  Pt states he was told once that he was a difficult intubation, but has has surgery since and no problems were noted. Was told to stop Eliquis after 10-17 -16 dose.

## 2014-11-21 NOTE — Pre-Procedure Instructions (Signed)
Curtis Barton  11/21/2014      [facility] - [place] ([place]), [place] - [address] [place] ([place]) [place] [address] Phone: [phone] Fax: [phone]    Your procedure is scheduled on Oct 21  Report to [facility] Admitting at 530 A.M.  Call this number if you have problems the morning of surgery:  [phone]   Remember:  Do not eat food or drink liquids after midnight.  Take these medicines the morning of surgery with A SIP OF WATER Acetaminophen if needed, atenolol (Tenormin), Cyclobenzaprine (Flexeril) if needed, Tapentadol (Nucynta) if needed  Stop taking Eliquis as directed by your Dr Stop taking Asprin, Ibuprofen Aleve, BC's, Goody's, Herbal medications, and Fish Oil    Do not wear jewelry, make-up or nail polish.  Do not wear lotions, powders, or perfumes.  You may wear deodorant.  Do not shave 48 hours prior to surgery.  Men may shave face and neck.  Do not bring valuables to the hospital.  [facility] is not responsible for any belongings or valuables.  Contacts, dentures or bridgework may not be worn into surgery.  Leave your suitcase in the car.  After surgery it may be brought to your room.  For patients admitted to the hospital, discharge time will be determined by your treatment team.  Patients discharged the day of surgery will not be allowed to drive home.    Special instructions: [facility] - Preparing for Surgery  Before surgery, you can play an important role.  Because skin is not sterile, your skin needs to be as free of germs as possible.  You can reduce the number of germs on you skin by washing with CHG (chlorahexidine gluconate) soap before surgery.  CHG is an antiseptic cleaner which kills germs and bonds with the skin to continue killing germs even after washing.  Please DO NOT use if you have an allergy to CHG or antibacterial soaps.  If your skin becomes reddened/irritated stop using the CHG and  inform your nurse when you arrive at Short Stay.  Do not shave (including legs and underarms) for at least 48 hours prior to the first CHG shower.  You may shave your face.  Please follow these instructions carefully:   1.  Shower with CHG Soap the night before surgery and the   morning of Surgery.  2.  If you choose to wash your hair, wash your hair first as usual with your  normal shampoo.  3.  After you shampoo, rinse your hair and body thoroughly to remove the   Shampoo.  4.  Use CHG as you would any other liquid soap.  You can apply chg directly  to the skin and wash gently with scrungie or a clean washcloth.  5.  Apply the CHG Soap to your body ONLY FROM THE NECK DOWN.  Do not use on open wounds or open sores.  Avoid contact with your eyes,  ears, mouth and genitals (private parts).  Wash genitals (private parts)   with your normal soap.  6.  Wash thoroughly, paying special attention to the area where your surgery  will be performed.  7.  Thoroughly rinse your body with warm water from the neck down.  8.  DO NOT shower/wash with your normal soap after using and rinsing off  the CHG Soap.  9.  Pat yourself dry with a clean towel.  10.  Wear clean pajamas.            11.  Place clean sheets on your bed the night of your first shower and do not   sleep with pets.  Day of Surgery  Do not apply any lotions/deoderants the morning of surgery.  Please wear clean clothes to the hospital/surgery center.     Please read over the following fact sheets that you were given. Pain Booklet, Coughing and Deep Breathing, Blood Transfusion Information, MRSA Information and Surgical Site Infection Prevention

## 2014-11-22 LAB — URINE CULTURE: Culture: NO GROWTH

## 2014-11-22 NOTE — Progress Notes (Signed)
Anesthesia Chart Review:  Pt is 59 year old male scheduled for R total hip arthroplasty on 12/01/2013 with Dr. Madelon Lips.   PCP is Dr. Windle Guard. EP cardiologist is Dr. Sherryl Manges.   PMH includes: HTN, atrial flutter (s/p ablation 10/13/13), TIA (2010), heart murmur, OSA (has not yet had CPAP titration), asthma. Former smoker. BMI 36. S/p R TKA 11/26/13.   Anesthesia history includes difficult intubation many years ago. Anesthesia records in Epic show an LMA was used during ablation 10/2013 and during R TKA 11/2013 without issue.   Medications include: eliquis, atenolol. Pt to take last dose of eliquis 11/28/14.   Preoperative labs reviewed.  PT 16.0. Will recheck DOS.   Chest x-ray 11/17/2014 reviewed. No active cardiopulmonary disease.   EKG 10/12/2014: NSR. RSR' or QR pattern in V1 suggests RV conduction delay.   Echo 10/13/2013:  -Left ventricle: The cavity size was normal. Wall thickness was normal. Systolic function was normal. The estimated ejection fraction was in the range of 60% to 65%.  Cardiac cath 05/17/2014:  1. Mild to moderate focal coronary artery disease in the distal left anterior descending artery (50%) and mid right coronary artery (50%). No apparent hemodynamically significant disease  2. Normal left ventricular systolic function. LVEDP 14 mmHg. Ejection fraction 60%. 3. No abdominal aortic aneurysm. No renal artery stenosis.   If PT acceptable DOS, I anticipate pt can proceed as scheduled.   Rica Mast, FNP-BC [facility] Short Stay Surgical Center/Anesthesiology Phone: [phone] 11/22/2014 4:06 PM

## 2014-11-29 ENCOUNTER — Encounter: Payer: Self-pay | Admitting: Internal Medicine

## 2014-12-01 MED ORDER — DEXTROSE 5 % IV SOLN
3.0000 g | INTRAVENOUS | Status: AC
  Administered 2014-12-02: 3 g via INTRAVENOUS
  Filled 2014-12-01: qty 3000

## 2014-12-01 MED ORDER — TRANEXAMIC ACID 1000 MG/10ML IV SOLN
2000.0000 mg | INTRAVENOUS | Status: DC
  Filled 2014-12-01: qty 20

## 2014-12-02 ENCOUNTER — Inpatient Hospital Stay (HOSPITAL_COMMUNITY): Payer: 59 | Admitting: Anesthesiology

## 2014-12-02 ENCOUNTER — Inpatient Hospital Stay (HOSPITAL_COMMUNITY): Payer: 59 | Admitting: Emergency Medicine

## 2014-12-02 ENCOUNTER — Encounter (HOSPITAL_COMMUNITY)
Admission: RE | Disposition: A | Discharge status: Home Health | Payer: Self-pay | Source: Ambulatory Visit | Attending: Orthopedic Surgery

## 2014-12-02 ENCOUNTER — Inpatient Hospital Stay (HOSPITAL_COMMUNITY)
Admission: RE | Admit: 2014-12-02 | Discharge: 2014-12-06 | DRG: 470 | Disposition: A | Discharge status: Home Health | Payer: 59 | Source: Ambulatory Visit | Attending: Orthopedic Surgery | Admitting: Orthopedic Surgery

## 2014-12-02 ENCOUNTER — Encounter (HOSPITAL_COMMUNITY): Payer: Self-pay | Admitting: Surgery

## 2014-12-02 ENCOUNTER — Inpatient Hospital Stay (HOSPITAL_COMMUNITY): Payer: 59

## 2014-12-02 DIAGNOSIS — I10 Essential (primary) hypertension: Secondary | ICD-10-CM | POA: Diagnosis present

## 2014-12-02 DIAGNOSIS — Z79899 Other long term (current) drug therapy: Secondary | ICD-10-CM

## 2014-12-02 DIAGNOSIS — Z7902 Long term (current) use of antithrombotics/antiplatelets: Secondary | ICD-10-CM | POA: Diagnosis not present

## 2014-12-02 DIAGNOSIS — Z8673 Personal history of transient ischemic attack (TIA), and cerebral infarction without residual deficits: Secondary | ICD-10-CM

## 2014-12-02 DIAGNOSIS — M1611 Unilateral primary osteoarthritis, right hip: Secondary | ICD-10-CM | POA: Diagnosis present

## 2014-12-02 DIAGNOSIS — M1711 Unilateral primary osteoarthritis, right knee: Secondary | ICD-10-CM | POA: Diagnosis present

## 2014-12-02 DIAGNOSIS — R002 Palpitations: Secondary | ICD-10-CM | POA: Diagnosis not present

## 2014-12-02 DIAGNOSIS — D62 Acute posthemorrhagic anemia: Secondary | ICD-10-CM | POA: Diagnosis not present

## 2014-12-02 DIAGNOSIS — Z6834 Body mass index (BMI) 34.0-34.9, adult: Secondary | ICD-10-CM | POA: Diagnosis not present

## 2014-12-02 DIAGNOSIS — Z87891 Personal history of nicotine dependence: Secondary | ICD-10-CM | POA: Diagnosis not present

## 2014-12-02 DIAGNOSIS — Z96649 Presence of unspecified artificial hip joint: Secondary | ICD-10-CM

## 2014-12-02 DIAGNOSIS — G4730 Sleep apnea, unspecified: Secondary | ICD-10-CM | POA: Diagnosis present

## 2014-12-02 DIAGNOSIS — M25551 Pain in right hip: Secondary | ICD-10-CM | POA: Diagnosis present

## 2014-12-02 HISTORY — PX: TOTAL HIP ARTHROPLASTY: SHX124

## 2014-12-02 LAB — PROTIME-INR
INR: 1.13 (ref 0.00–1.49)
Prothrombin Time: 14.7 seconds (ref 11.6–15.2)

## 2014-12-02 SURGERY — ARTHROPLASTY, HIP, TOTAL,POSTERIOR APPROACH
Anesthesia: General | Site: Hip | Laterality: Right

## 2014-12-02 MED ORDER — CEFAZOLIN SODIUM-DEXTROSE 2-3 GM-% IV SOLR: 2.0000 g | INTRAVENOUS | Status: DC

## 2014-12-02 MED ORDER — ROCURONIUM BROMIDE 50 MG/5ML IV SOLN
INTRAVENOUS | Status: AC
  Filled 2014-12-02: qty 1

## 2014-12-02 MED ORDER — LACTATED RINGERS IV SOLN
INTRAVENOUS | Status: DC | PRN
  Administered 2014-12-02 (×3): via INTRAVENOUS

## 2014-12-02 MED ORDER — POLYETHYLENE GLYCOL 3350 17 G PO PACK
17.0000 g | PACK | Freq: Every day | ORAL | Status: DC | PRN
  Administered 2014-12-04 – 2014-12-06 (×3): 17 g via ORAL
  Filled 2014-12-02 (×3): qty 1

## 2014-12-02 MED ORDER — ONDANSETRON HCL 4 MG/2ML IJ SOLN
INTRAMUSCULAR | Status: DC | PRN
  Administered 2014-12-02: 4 mg via INTRAVENOUS

## 2014-12-02 MED ORDER — PROPOFOL 10 MG/ML IV BOLUS
INTRAVENOUS | Status: AC
  Filled 2014-12-02: qty 20

## 2014-12-02 MED ORDER — DIPHENHYDRAMINE HCL 12.5 MG/5ML PO ELIX: 12.5000 mg | ORAL_SOLUTION | ORAL | Status: DC | PRN

## 2014-12-02 MED ORDER — METOCLOPRAMIDE HCL 5 MG PO TABS: 5.0000 mg | ORAL_TABLET | Freq: Three times a day (TID) | ORAL | Status: DC | PRN

## 2014-12-02 MED ORDER — SUCCINYLCHOLINE CHLORIDE 20 MG/ML IJ SOLN
INTRAMUSCULAR | Status: AC
  Filled 2014-12-02: qty 1

## 2014-12-02 MED ORDER — LIDOCAINE HCL (CARDIAC) 20 MG/ML IV SOLN
INTRAVENOUS | Status: DC | PRN
  Administered 2014-12-02: 100 mg via INTRAVENOUS

## 2014-12-02 MED ORDER — DEXAMETHASONE SODIUM PHOSPHATE 4 MG/ML IJ SOLN
INTRAMUSCULAR | Status: DC | PRN
  Administered 2014-12-02: 4 mg via INTRAVENOUS

## 2014-12-02 MED ORDER — ONDANSETRON HCL 4 MG PO TABS: 4.0000 mg | ORAL_TABLET | Freq: Four times a day (QID) | ORAL | Status: DC | PRN

## 2014-12-02 MED ORDER — BISACODYL 10 MG RE SUPP
10.0000 mg | Freq: Every day | RECTAL | Status: DC | PRN
  Administered 2014-12-05: 10 mg via RECTAL
  Filled 2014-12-02: qty 1

## 2014-12-02 MED ORDER — DOCUSATE SODIUM 100 MG PO CAPS
100.0000 mg | ORAL_CAPSULE | Freq: Two times a day (BID) | ORAL | Status: DC
  Administered 2014-12-02 – 2014-12-06 (×9): 100 mg via ORAL
  Filled 2014-12-02 (×9): qty 1

## 2014-12-02 MED ORDER — SODIUM CHLORIDE 0.9 % IV SOLN
2000.0000 mg | INTRAVENOUS | Status: DC | PRN
  Administered 2014-12-02: 2000 mg via TOPICAL

## 2014-12-02 MED ORDER — PROMETHAZINE HCL 25 MG/ML IJ SOLN: 6.2500 mg | INTRAMUSCULAR | Status: DC | PRN

## 2014-12-02 MED ORDER — ACETAMINOPHEN 650 MG RE SUPP: 650.0000 mg | Freq: Four times a day (QID) | RECTAL | Status: DC | PRN

## 2014-12-02 MED ORDER — ALBUMIN HUMAN 5 % IV SOLN
INTRAVENOUS | Status: DC | PRN
  Administered 2014-12-02: 09:00:00 via INTRAVENOUS

## 2014-12-02 MED ORDER — ACETAMINOPHEN 325 MG PO TABS
650.0000 mg | ORAL_TABLET | Freq: Four times a day (QID) | ORAL | Status: DC | PRN
Start: 2014-12-02 — End: 2014-12-06
  Administered 2014-12-03 – 2014-12-05 (×2): 650 mg via ORAL
  Filled 2014-12-02 (×2): qty 2

## 2014-12-02 MED ORDER — NEOSTIGMINE METHYLSULFATE 10 MG/10ML IV SOLN
INTRAVENOUS | Status: DC | PRN
  Administered 2014-12-02: 5 mg via INTRAVENOUS

## 2014-12-02 MED ORDER — SODIUM CHLORIDE 0.9 % IV SOLN
INTRAVENOUS | Status: DC
  Administered 2014-12-02: 18:00:00 via INTRAVENOUS

## 2014-12-02 MED ORDER — MIDAZOLAM HCL 5 MG/5ML IJ SOLN
INTRAMUSCULAR | Status: DC | PRN
  Administered 2014-12-02: 2 mg via INTRAVENOUS

## 2014-12-02 MED ORDER — GLYCOPYRROLATE 0.2 MG/ML IJ SOLN
INTRAMUSCULAR | Status: DC | PRN
  Administered 2014-12-02: .8 mg via INTRAVENOUS

## 2014-12-02 MED ORDER — FENTANYL CITRATE (PF) 250 MCG/5ML IJ SOLN
INTRAMUSCULAR | Status: DC | PRN
  Administered 2014-12-02 (×3): 50 ug via INTRAVENOUS
  Administered 2014-12-02: 25 ug via INTRAVENOUS
  Administered 2014-12-02 (×2): 50 ug via INTRAVENOUS
  Administered 2014-12-02: 25 ug via INTRAVENOUS
  Administered 2014-12-02 (×3): 50 ug via INTRAVENOUS

## 2014-12-02 MED ORDER — LIDOCAINE HCL (CARDIAC) 20 MG/ML IV SOLN
INTRAVENOUS | Status: AC
  Filled 2014-12-02: qty 5

## 2014-12-02 MED ORDER — CYCLOBENZAPRINE HCL 10 MG PO TABS
10.0000 mg | ORAL_TABLET | Freq: Three times a day (TID) | ORAL | Status: DC | PRN
  Administered 2014-12-02 – 2014-12-06 (×7): 10 mg via ORAL
  Filled 2014-12-02 (×6): qty 1

## 2014-12-02 MED ORDER — FENTANYL CITRATE (PF) 250 MCG/5ML IJ SOLN
INTRAMUSCULAR | Status: AC
  Filled 2014-12-02: qty 5

## 2014-12-02 MED ORDER — PHENOL 1.4 % MT LIQD: 1.0000 | OROMUCOSAL | Status: DC | PRN

## 2014-12-02 MED ORDER — BUPIVACAINE-EPINEPHRINE (PF) 0.5% -1:200000 IJ SOLN
INTRAMUSCULAR | Status: AC
  Filled 2014-12-02: qty 30

## 2014-12-02 MED ORDER — ONDANSETRON HCL 4 MG/2ML IJ SOLN: 4.0000 mg | Freq: Four times a day (QID) | INTRAMUSCULAR | Status: DC | PRN

## 2014-12-02 MED ORDER — SUCCINYLCHOLINE CHLORIDE 20 MG/ML IJ SOLN
INTRAMUSCULAR | Status: DC | PRN
  Administered 2014-12-02: 140 mg via INTRAVENOUS

## 2014-12-02 MED ORDER — METOCLOPRAMIDE HCL 5 MG/ML IJ SOLN: 5.0000 mg | Freq: Three times a day (TID) | INTRAMUSCULAR | Status: DC | PRN

## 2014-12-02 MED ORDER — HYDROMORPHONE HCL 1 MG/ML IJ SOLN
0.2500 mg | INTRAMUSCULAR | Status: DC | PRN
  Administered 2014-12-02 (×4): 0.5 mg via INTRAVENOUS

## 2014-12-02 MED ORDER — FLEET ENEMA 7-19 GM/118ML RE ENEM: 1.0000 | ENEMA | Freq: Once | RECTAL | Status: DC | PRN

## 2014-12-02 MED ORDER — HYDROMORPHONE HCL 2 MG PO TABS
ORAL_TABLET | ORAL | Status: AC
  Filled 2014-12-02: qty 2

## 2014-12-02 MED ORDER — SODIUM CHLORIDE 0.9 % IV SOLN: INTRAVENOUS | Status: DC

## 2014-12-02 MED ORDER — OXYCODONE HCL 5 MG/5ML PO SOLN: 5.0000 mg | Freq: Once | ORAL | Status: DC | PRN

## 2014-12-02 MED ORDER — ACETAMINOPHEN 500 MG PO TABS
1000.0000 mg | ORAL_TABLET | Freq: Four times a day (QID) | ORAL | Status: AC
Start: 2014-12-02 — End: 2014-12-03
  Administered 2014-12-02 – 2014-12-03 (×3): 1000 mg via ORAL
  Filled 2014-12-02 (×3): qty 2

## 2014-12-02 MED ORDER — TRANEXAMIC ACID 1000 MG/10ML IV SOLN
2000.0000 mg | INTRAVENOUS | Status: DC
  Filled 2014-12-02: qty 20

## 2014-12-02 MED ORDER — CEFAZOLIN SODIUM-DEXTROSE 2-3 GM-% IV SOLR
2.0000 g | Freq: Four times a day (QID) | INTRAVENOUS | Status: AC
  Administered 2014-12-02 (×2): 2 g via INTRAVENOUS
  Filled 2014-12-02 (×3): qty 50

## 2014-12-02 MED ORDER — GLYCOPYRROLATE 0.2 MG/ML IJ SOLN
INTRAMUSCULAR | Status: AC
  Filled 2014-12-02: qty 3

## 2014-12-02 MED ORDER — BUPIVACAINE-EPINEPHRINE 0.5% -1:200000 IJ SOLN
INTRAMUSCULAR | Status: DC | PRN
  Administered 2014-12-02: 20 mL

## 2014-12-02 MED ORDER — APIXABAN 5 MG PO TABS
5.0000 mg | ORAL_TABLET | Freq: Two times a day (BID) | ORAL | Status: DC
  Administered 2014-12-03 – 2014-12-06 (×7): 5 mg via ORAL
  Filled 2014-12-02 (×7): qty 1

## 2014-12-02 MED ORDER — SUCCINYLCHOLINE 20MG/ML (10ML) SYRINGE FOR MEDFUSION PUMP - OPTIME: INTRAMUSCULAR | Status: DC | PRN

## 2014-12-02 MED ORDER — CYCLOBENZAPRINE HCL 10 MG PO TABS
ORAL_TABLET | ORAL | Status: AC
  Filled 2014-12-02: qty 1

## 2014-12-02 MED ORDER — MIDAZOLAM HCL 2 MG/2ML IJ SOLN
INTRAMUSCULAR | Status: AC
  Filled 2014-12-02: qty 4

## 2014-12-02 MED ORDER — EPHEDRINE SULFATE 50 MG/ML IJ SOLN
INTRAMUSCULAR | Status: AC
  Filled 2014-12-02: qty 1

## 2014-12-02 MED ORDER — SODIUM CHLORIDE 0.9 % IJ SOLN
INTRAMUSCULAR | Status: AC
  Filled 2014-12-02: qty 10

## 2014-12-02 MED ORDER — NEOSTIGMINE METHYLSULFATE 10 MG/10ML IV SOLN
INTRAVENOUS | Status: AC
  Filled 2014-12-02: qty 1

## 2014-12-02 MED ORDER — GLYCOPYRROLATE 0.2 MG/ML IJ SOLN
INTRAMUSCULAR | Status: AC
  Filled 2014-12-02: qty 1

## 2014-12-02 MED ORDER — PHENYLEPHRINE HCL 10 MG/ML IJ SOLN
10.0000 mg | INTRAVENOUS | Status: DC | PRN
  Administered 2014-12-02: 20 ug/min via INTRAVENOUS

## 2014-12-02 MED ORDER — ROCURONIUM BROMIDE 100 MG/10ML IV SOLN
INTRAVENOUS | Status: DC | PRN
  Administered 2014-12-02 (×3): 10 mg via INTRAVENOUS
  Administered 2014-12-02: 30 mg via INTRAVENOUS

## 2014-12-02 MED ORDER — HYDROMORPHONE HCL 1 MG/ML IJ SOLN
1.0000 mg | INTRAMUSCULAR | Status: DC | PRN
Start: 2014-12-02 — End: 2014-12-06

## 2014-12-02 MED ORDER — ATENOLOL 50 MG PO TABS
25.0000 mg | ORAL_TABLET | Freq: Two times a day (BID) | ORAL | Status: DC
  Administered 2014-12-02 – 2014-12-06 (×8): 25 mg via ORAL
  Filled 2014-12-02 (×8): qty 1

## 2014-12-02 MED ORDER — SODIUM CHLORIDE 0.9 % IR SOLN
Status: DC | PRN
  Administered 2014-12-02: 1000 mL

## 2014-12-02 MED ORDER — HYDROMORPHONE HCL 2 MG PO TABS: ORAL_TABLET | ORAL | Status: DC

## 2014-12-02 MED ORDER — OXYCODONE HCL 5 MG PO TABS: 5.0000 mg | ORAL_TABLET | Freq: Once | ORAL | Status: DC | PRN

## 2014-12-02 MED ORDER — MENTHOL 3 MG MT LOZG: 1.0000 | LOZENGE | OROMUCOSAL | Status: DC | PRN

## 2014-12-02 MED ORDER — CHLORHEXIDINE GLUCONATE 4 % EX LIQD: 60.0000 mL | Freq: Once | CUTANEOUS | Status: DC

## 2014-12-02 MED ORDER — EPHEDRINE SULFATE 50 MG/ML IJ SOLN
INTRAMUSCULAR | Status: DC | PRN
  Administered 2014-12-02 (×5): 5 mg via INTRAVENOUS
  Administered 2014-12-02 (×2): 10 mg via INTRAVENOUS
  Administered 2014-12-02: 5 mg via INTRAVENOUS

## 2014-12-02 MED ORDER — HYDROMORPHONE HCL 2 MG PO TABS
4.0000 mg | ORAL_TABLET | ORAL | Status: DC | PRN
  Administered 2014-12-02 – 2014-12-06 (×14): 4 mg via ORAL
  Filled 2014-12-02: qty 1
  Filled 2014-12-02 (×13): qty 2

## 2014-12-02 MED ORDER — HYDROMORPHONE HCL 1 MG/ML IJ SOLN
INTRAMUSCULAR | Status: AC
  Filled 2014-12-02: qty 2

## 2014-12-02 MED ORDER — PROPOFOL 10 MG/ML IV BOLUS
INTRAVENOUS | Status: DC | PRN
  Administered 2014-12-02 (×2): 10 mg via INTRAVENOUS
  Administered 2014-12-02: 160 mg via INTRAVENOUS

## 2014-12-02 SURGICAL SUPPLY — 63 items
BAG DECANTER FOR FLEXI CONT (MISCELLANEOUS) ×1 IMPLANT
BLADE SAW SAG 73X25 THK (BLADE) ×1
BLADE SAW SGTL 73X25 THK (BLADE) ×1 IMPLANT
BRUSH FEMORAL CANAL (MISCELLANEOUS) IMPLANT
CAPT HIP TOTAL 2 ×1 IMPLANT
COVER SURGICAL LIGHT HANDLE (MISCELLANEOUS) ×2 IMPLANT
DRAPE INCISE IOBAN 66X45 STRL (DRAPES) ×1 IMPLANT
DRAPE ORTHO SPLIT 77X108 STRL (DRAPES) ×4
DRAPE SURG ORHT 6 SPLT 77X108 (DRAPES) ×2 IMPLANT
DRAPE U-SHAPE 47X51 STRL (DRAPES) ×2 IMPLANT
DRSG ADAPTIC 3X8 NADH LF (GAUZE/BANDAGES/DRESSINGS) ×2 IMPLANT
DRSG PAD ABDOMINAL 8X10 ST (GAUZE/BANDAGES/DRESSINGS) ×4 IMPLANT
DURAPREP 26ML APPLICATOR (WOUND CARE) ×3 IMPLANT
ELECT BLADE 6.5 EXT (BLADE) ×1 IMPLANT
ELECT CAUTERY BLADE 6.4 (BLADE) ×2 IMPLANT
ELECT REM PT RETURN 9FT ADLT (ELECTROSURGICAL) ×2
ELECTRODE REM PT RTRN 9FT ADLT (ELECTROSURGICAL) ×1 IMPLANT
FACESHIELD WRAPAROUND (MASK) ×4 IMPLANT
FACESHIELD WRAPAROUND OR TEAM (MASK) ×2 IMPLANT
GAUZE SPONGE 4X4 12PLY STRL (GAUZE/BANDAGES/DRESSINGS) ×2 IMPLANT
GLOVE BIO SURGEON STRL SZ 6.5 (GLOVE) ×2 IMPLANT
GLOVE BIOGEL PI IND STRL 6.5 (GLOVE) IMPLANT
GLOVE BIOGEL PI IND STRL 8 (GLOVE) ×2 IMPLANT
GLOVE BIOGEL PI INDICATOR 6.5 (GLOVE) ×2
GLOVE BIOGEL PI INDICATOR 8 (GLOVE) ×2
GLOVE ORTHO TXT STRL SZ7.5 (GLOVE) ×5 IMPLANT
GLOVE SURG ORTHO 8.0 STRL STRW (GLOVE) ×4 IMPLANT
GOWN STRL REUS W/ TWL LRG LVL3 (GOWN DISPOSABLE) ×1 IMPLANT
GOWN STRL REUS W/ TWL XL LVL3 (GOWN DISPOSABLE) ×1 IMPLANT
GOWN STRL REUS W/TWL 2XL LVL3 (GOWN DISPOSABLE) ×2 IMPLANT
GOWN STRL REUS W/TWL LRG LVL3 (GOWN DISPOSABLE) ×2
GOWN STRL REUS W/TWL XL LVL3 (GOWN DISPOSABLE) ×2
HANDPIECE INTERPULSE COAX TIP (DISPOSABLE)
HOOD PEEL AWAY FACE SHEILD DIS (HOOD) ×2 IMPLANT
IMMOBILIZER KNEE 22 (SOFTGOODS) ×1 IMPLANT
IMMOBILIZER KNEE 22 UNIV (SOFTGOODS) ×2 IMPLANT
KIT BASIN OR (CUSTOM PROCEDURE TRAY) ×2 IMPLANT
KIT ROOM TURNOVER OR (KITS) ×2 IMPLANT
MANIFOLD NEPTUNE II (INSTRUMENTS) ×2 IMPLANT
NDL MAYO TROCAR (NEEDLE) IMPLANT
NEEDLE 22X1 1/2 (OR ONLY) (NEEDLE) ×2 IMPLANT
NEEDLE MAYO TROCAR (NEEDLE) IMPLANT
NS IRRIG 1000ML POUR BTL (IV SOLUTION) ×2 IMPLANT
PACK TOTAL JOINT (CUSTOM PROCEDURE TRAY) ×2 IMPLANT
PACK UNIVERSAL I (CUSTOM PROCEDURE TRAY) ×2 IMPLANT
PAD ABD 8X10 STRL (GAUZE/BANDAGES/DRESSINGS) ×1 IMPLANT
PAD ARMBOARD 7.5X6 YLW CONV (MISCELLANEOUS) ×4 IMPLANT
PRESSURIZER FEMORAL UNIV (MISCELLANEOUS) IMPLANT
SET HNDPC FAN SPRY TIP SCT (DISPOSABLE) IMPLANT
SPONGE GAUZE 4X4 12PLY STER LF (GAUZE/BANDAGES/DRESSINGS) ×1 IMPLANT
STAPLER VISISTAT 35W (STAPLE) ×2 IMPLANT
SUCTION FRAZIER TIP 10 FR DISP (SUCTIONS) ×2 IMPLANT
SUT ETHIBOND 2 V 37 (SUTURE) ×4 IMPLANT
SUT VIC AB 0 CT1 27 (SUTURE) ×2
SUT VIC AB 0 CT1 27XBRD ANBCTR (SUTURE) ×1 IMPLANT
SUT VIC AB 2-0 CT1 27 (SUTURE) ×4
SUT VIC AB 2-0 CT1 TAPERPNT 27 (SUTURE) ×2 IMPLANT
SYR CONTROL 10ML LL (SYRINGE) ×2 IMPLANT
TAPE CLOTH SURG 6X10 WHT LF (GAUZE/BANDAGES/DRESSINGS) ×1 IMPLANT
TOWEL OR 17X24 6PK STRL BLUE (TOWEL DISPOSABLE) ×2 IMPLANT
TOWEL OR 17X26 10 PK STRL BLUE (TOWEL DISPOSABLE) ×2 IMPLANT
TOWER CARTRIDGE SMART MIX (DISPOSABLE) IMPLANT
WATER STERILE IRR 1000ML POUR (IV SOLUTION) ×2 IMPLANT

## 2014-12-02 NOTE — H&P (View-Only) (Signed)
TOTAL HIP ADMISSION H&P  Patient is admitted for right total hip arthroplasty.  Subjective:  Chief Complaint: right hip pain  HPI: Curtis Barton, 59 y.o. male, has a history of pain and functional disability in the right hip(s) due to arthritis and patient has failed non-surgical conservative treatments for greater than 12 weeks to include NSAID's and/or analgesics, corticosteriod injections, use of assistive devices and activity modification.  Onset of symptoms was gradual starting 3 years ago with gradually worsening course since that time.The patient noted no past surgery on the right hip(s).  Patient currently rates pain in the right hip at 8 out of 10 with activity. Patient has night pain, worsening of pain with activity and weight bearing, trendelenberg gait, pain that interfers with activities of daily living, pain with passive range of motion, crepitus and joint swelling. Patient has evidence of periarticular osteophytes and joint space narrowing by imaging studies. This condition presents safety issues increasing the risk of falls.  There is no current active infection.  Patient Active Problem List   Diagnosis Date Noted  . Palpitations 10/12/2014  . Near syncope 10/12/2014  . Angina pectoris   . Essential hypertension   . Osteoarthritis of right knee 11/26/2013  . History of tobacco abuse 11/15/2013  . Preoperative cardiovascular examination 11/15/2013  . Primary localized osteoarthritis of right knee 11/03/2013  . Hypertension   . Arthritis   . TIA (transient ischemic attack)   . Atrial flutter 10/12/2013   Past Medical History  Diagnosis Date  . Hypertension   . Arthritis   . TIA (transient ischemic attack) 2010  . Atrial flutter     a. s/p ablation  . Primary localized osteoarthritis of right knee   . Asthma     as child  . Cancer   . Fuchs' uveitis syndrome     left eye    Past Surgical History  Procedure Laterality Date  . Hernia repair    . Tonsillectomy     . Knee cartilage surgery Bilateral   . Acle repair Right 2000    ACL repair  . Cystoscopy w/ ureteral stent placement  1999  . Rotator cuff repair Right 2005  . Atrial ablation surgery  10/2013    Dr Graciela Husbands  . Eye surgery    . Cataract extraction w/ intraocular lens  implant, bilateral    . Kidney surgery    . Total knee arthroplasty Right 11/26/2013    dr caffrey  . Total knee arthroplasty Right 11/26/2013    Procedure: RIGHT TOTAL KNEE ARTHROPLASTY;  Surgeon: Thera Flake., MD;  Location: [facility] OR;  Service: Orthopedics;  Laterality: Right;  . Atrial flutter ablation N/A 10/13/2013    Procedure: ATRIAL FLUTTER ABLATION;  Surgeon: Duke Salvia, MD;  Location: [facility] CATH LAB;  Service: Cardiovascular;  Laterality: N/A;  . Left heart catheterization with coronary angiogram N/A 05/17/2014    Procedure: LEFT HEART CATHETERIZATION WITH CORONARY ANGIOGRAM;  Surgeon: Corky Crafts, MD;  Location: [facility] CATH LAB;  Service: Cardiovascular;  Laterality: N/A;  . Ep implantable device N/A 10/12/2014    Procedure: Loop Recorder Insertion;  Surgeon: Duke Salvia, MD;  Location: [facility] INVASIVE CV LAB;  Service: Cardiovascular;  Laterality: N/A;     (Not in a hospital admission) Allergies  Allergen Reactions  . Hydrocodone Itching  . Oxycodone Itching  . Codeine Itching    Social History  Substance Use Topics  . Smoking status: Former Smoker -- 1.00 packs/day  Types: Cigarettes    Quit date: 10/12/2013  . Smokeless tobacco: Never Used  . Alcohol Use: No    Family History  Problem Relation Age of Onset  . Heart disease    . Hypertension    . Cancer    . Heart disease Mother   . Cancer Father      Review of Systems  Constitutional: Negative.   HENT: Negative.   Eyes: Negative.   Respiratory: Negative.   Cardiovascular: Positive for palpitations and leg swelling.  Gastrointestinal: Negative.   Genitourinary: Negative.   Musculoskeletal: Positive for joint pain.  Skin: Negative.    Neurological: Positive for dizziness.  Endo/Heme/Allergies: Negative.   Psychiatric/Behavioral: Negative.     Objective:  Physical Exam  Constitutional: He is oriented to person, place, and time. He appears well-developed and well-nourished. No distress.  HENT:  Head: Normocephalic and atraumatic.  Nose: Nose normal.  Eyes: Conjunctivae and EOM are normal. Pupils are equal, round, and reactive to light.  Neck: Normal range of motion. Neck supple.  Cardiovascular: Normal rate, regular rhythm, normal heart sounds and intact distal pulses.   Respiratory: Effort normal and breath sounds normal. No respiratory distress.  GI: Soft. Bowel sounds are normal. He exhibits no distension. There is no tenderness.  Musculoskeletal:       Right hip: He exhibits decreased range of motion, tenderness and bony tenderness.  Lymphadenopathy:    He has no cervical adenopathy.  Neurological: He is alert and oriented to person, place, and time. No cranial nerve deficit.  Skin: Skin is warm and dry. No rash noted. No erythema.  Psychiatric: He has a normal mood and affect. His behavior is normal.    Vital signs in last 24 hours: @VSRANGES @  Labs:   Estimated body mass index is 34.31 kg/(m^2) as calculated from the following:   Height as of 10/12/14: 6\' 4"  (1.93 m).   Weight as of 10/12/14: 127.801 kg (281 lb 12 oz).   Imaging Review Plain radiographs demonstrate moderate degenerative joint disease of the right hip(s). The bone quality appears to be good for age and reported activity level.  Assessment/Plan:  End stage arthritis, right hip(s)  The patient history, physical examination, clinical judgement of the provider and imaging studies are consistent with end stage degenerative joint disease of the right hip(s) and total hip arthroplasty is deemed medically necessary. The treatment options including medical management, injection therapy, arthroscopy and arthroplasty were discussed at length.  The risks and benefits of total hip arthroplasty were presented and reviewed. The risks due to aseptic loosening, infection, stiffness, dislocation/subluxation,  thromboembolic complications and other imponderables were discussed.  The patient acknowledged the explanation, agreed to proceed with the plan and consent was signed. Patient is being admitted for inpatient treatment for surgery, pain control, PT, OT, prophylactic antibiotics, VTE prophylaxis, progressive ambulation and ADL's and discharge planning.The patient is planning to be discharged home with home health services

## 2014-12-02 NOTE — Evaluation (Signed)
Physical Therapy Evaluation Patient Details Name: Curtis EagleShannon H Chambless MRN: [id number] DOB: Sep 03, 1955 Today's Date: 12/02/2014   History of Present Illness  59 y.o. male s/p Rt total hip arthroplasty via posterior approach. Hx of atrial flutter, HTN, and Angina pectoris.  Clinical Impression  Pt is s/p right THA resulting in the deficits listed below (see PT Problem List). Educated on precautions and safety with mobility. Ambulating with close guard assist up to 20 feet post op day #0, demonstrating good strength. Strong family support that will care for pt at d/c. Pt will benefit from skilled PT to increase their independence and safety with mobility to allow discharge to the venue listed below.      Follow Up Recommendations Home health PT;Supervision for mobility/OOB    Equipment Recommendations  None recommended by PT    Recommendations for Other Services OT consult     Precautions / Restrictions Precautions Precautions: Posterior Hip;Fall Precaution Booklet Issued: Yes (comment) Precaution Comments: reviewed  precautions Required Braces or Orthoses: Knee Immobilizer - Right Restrictions Weight Bearing Restrictions: Yes RLE Weight Bearing: Weight bearing as tolerated      Mobility  Bed Mobility Overal bed mobility: Needs Assistance Bed Mobility: Supine to Sit     Supine to sit: Min assist;HOB elevated     General bed mobility comments: Min assist for RLE support out of bed. VC for technique.  Transfers Overall transfer level: Needs assistance Equipment used: Rolling walker (2 wheeled) Transfers: Sit to/from Stand Sit to Stand: Min guard;From elevated surface         General transfer comment: Close guard for safety. VC for hand placement and to maintain hip precautions. Performed from bed. Demos good LLE and BIL UE strength  Ambulation/Gait Ambulation/Gait assistance: Min guard Ambulation Distance (Feet): 20 Feet Assistive device: Rolling walker (2  wheeled) Gait Pattern/deviations: Step-to pattern;Decreased step length - left;Decreased stance time - right;Antalgic Gait velocity: decreased Gait velocity interpretation: Below normal speed for age/gender General Gait Details: Educated on safe DME use with a rolling walker. Moderately antalgic, slowly accepting more weight through RLE as distance increased. Good UE strength and no buckling noted.  Stairs            Wheelchair Mobility    Modified Rankin (Stroke Patients Only)       Balance Overall balance assessment: Needs assistance Sitting-balance support: No upper extremity supported;Feet supported Sitting balance-Leahy Scale: Good     Standing balance support: No upper extremity supported;During functional activity Standing balance-Leahy Scale: Fair Standing balance comment: Stood for several minutes while trying to urinate intermittently releasing RW with fair balance.                             Pertinent Vitals/Pain Pain Assessment: 0-10 Pain Score: 4  Pain Location: Right hip Pain Descriptors / Indicators: Aching Pain Intervention(s): Monitored during session;Repositioned    Home Living Family/patient expects to be discharged to:: Private residence Living Arrangements: Spouse/significant other Available Help at Discharge: Family;Available 24 hours/day (wife and daughter initially) Type of Home: House Home Access: Stairs to enter Entrance Stairs-Rails: None Entrance Stairs-Number of Steps: 4+1+1 Home Layout: One level Home Equipment: Environmental consultantWalker - 2 wheels;Bedside commode;Shower seat;Crutches;Cane - quad      Prior Function Level of Independence: Independent with assistive device(s)         Comments: occasionally uses cane     Hand Dominance   Dominant Hand: Right    Extremity/Trunk Assessment  Upper Extremity Assessment: Defer to OT evaluation           Lower Extremity Assessment: RLE deficits/detail RLE Deficits / Details:  decreased strength and ROM as expected post op       Communication   Communication: No difficulties  Cognition Arousal/Alertness: Awake/alert Behavior During Therapy: WFL for tasks assessed/performed Overall Cognitive Status: Within Functional Limits for tasks assessed                      General Comments General comments (skin integrity, edema, etc.): Could not urinate. Family present, supportive.    Exercises Total Joint Exercises Ankle Circles/Pumps: AROM;Both;15 reps;Seated Quad Sets: Strengthening;Both;10 reps;Seated Gluteal Sets: Strengthening;Both;5 reps;Seated      Assessment/Plan    PT Assessment Patient needs continued PT services  PT Diagnosis Difficulty walking;Abnormality of gait;Acute pain   PT Problem List Decreased strength;Decreased range of motion;Decreased activity tolerance;Decreased balance;Decreased mobility;Decreased knowledge of use of DME;Decreased knowledge of precautions;Obesity;Pain  PT Treatment Interventions DME instruction;Gait training;Stair training;Functional mobility training;Therapeutic activities;Therapeutic exercise;Balance training;Neuromuscular re-education;Patient/family education;Modalities   PT Goals (Current goals can be found in the Care Plan section) Acute Rehab PT Goals Patient Stated Goal: Get out of bed PT Goal Formulation: With patient Time For Goal Achievement: 12/09/14 Potential to Achieve Goals: Good    Frequency 7X/week   Barriers to discharge        Co-evaluation               End of Session Equipment Utilized During Treatment: Right knee immobilizer Activity Tolerance: Patient tolerated treatment well Patient left: in chair;with call bell/phone within reach;with family/visitor present;with SCD's reapplied Nurse Communication: Mobility status         Time: 1610-9604 PT Time Calculation (min) (ACUTE ONLY): 33 min   Charges:   PT Evaluation $Initial PT Evaluation Tier I: 1 Procedure PT  Treatments $Therapeutic Activity: 8-22 mins   PT G CodesBerton Mount 12/02/2014, 5:15 PM [place], [place] [phone]

## 2014-12-02 NOTE — Anesthesia Postprocedure Evaluation (Signed)
  Anesthesia Post-op Note  Patient: Curtis Barton  Procedure(s) Performed: Procedure(s) (LRB): TOTAL HIP ARTHROPLASTY (Right)  Patient Location: PACU  Anesthesia Type: General  Level of Consciousness: awake and alert   Airway and Oxygen Therapy: Patient Spontanous Breathing  Post-op Pain: mild  Post-op Assessment: Post-op Vital signs reviewed, Patient's Cardiovascular Status Stable, Respiratory Function Stable, Patent Airway and No signs of Nausea or vomiting  Last Vitals:  Filed Vitals:   12/02/14 1045  BP: 147/73  Pulse: 66  Temp:   Resp: 13    Post-op Vital Signs: stable   Complications: No apparent anesthesia complications

## 2014-12-02 NOTE — Progress Notes (Signed)
Utilization review completed.  

## 2014-12-02 NOTE — Anesthesia Preprocedure Evaluation (Addendum)
Anesthesia Evaluation  Patient identified by MRN, date of birth, ID band Patient awake    Reviewed: Allergy & Precautions, NPO status , Patient's Chart, lab work & pertinent test results  History of Anesthesia Complications (+) history of anesthetic complications (Patient is a grade 1 view with a Mac 4 glidescope, easily placed)  Airway Mallampati: II  TM Distance: >3 FB Neck ROM: Full    Dental  (+) Dental Advisory Given   Pulmonary asthma , sleep apnea , former smoker,    Pulmonary exam normal        Cardiovascular hypertension, Pt. on medications and Pt. on home beta blockers + dysrhythmias  Rhythm:Regular Rate:Normal  Echo 2015 with normal EF, no significant valve abnormalities   Neuro/Psych TIA   GI/Hepatic   Endo/Other  Morbid obesity  Renal/GU Renal disease     Musculoskeletal  (+) Arthritis ,   Abdominal   Peds  Hematology   Anesthesia Other Findings   Reproductive/Obstetrics                        Anesthesia Physical Anesthesia Plan  ASA: III  Anesthesia Plan: General   Post-op Pain Management:    Induction: Intravenous  Airway Management Planned: Oral ETT and Video Laryngoscope Planned  Additional Equipment:   Intra-op Plan:   Post-operative Plan: Extubation in OR  Informed Consent: I have reviewed the patients History and Physical, chart, labs and discussed the procedure including the risks, benefits and alternatives for the proposed anesthesia with the patient or authorized representative who has indicated his/her understanding and acceptance.   Dental advisory given  Plan Discussed with: Anesthesiologist, Surgeon and CRNA  Anesthesia Plan Comments:        Anesthesia Quick Evaluation

## 2014-12-02 NOTE — Transfer of Care (Signed)
Immediate Anesthesia Transfer of Care Note  Patient: Curtis Barton  Procedure(s) Performed: Procedure(s): TOTAL HIP ARTHROPLASTY (Right)  Patient Location: PACU  Anesthesia Type:General  Level of Consciousness: awake, alert  and oriented  Airway & Oxygen Therapy: Patient Spontanous Breathing and Patient connected to nasal cannula oxygen  Post-op Assessment: Report given to RN, Post -op Vital signs reviewed and stable and Patient moving all extremities X 4  Post vital signs: Reviewed and stable  Last Vitals:  Filed Vitals:   12/02/14 1018  BP: 152/93  Pulse: 76  Temp: 36.1 C  Resp: 14    Complications: No apparent anesthesia complications

## 2014-12-02 NOTE — Anesthesia Procedure Notes (Signed)
Procedure Name: Intubation Date/Time: 12/02/2014 7:45 AM Performed by: Glo HerringLEE, Elza Varricchio B Pre-anesthesia Checklist: Patient identified, Emergency Drugs available, Suction available, Patient being monitored and Timeout performed Patient Re-evaluated:Patient Re-evaluated prior to inductionOxygen Delivery Method: Circle system utilized Preoxygenation: Pre-oxygenation with 100% oxygen Intubation Type: IV induction Ventilation: Mask ventilation without difficulty Grade View: Grade I Tube type: Oral Tube size: 7.5 mm Number of attempts: 1 Airway Equipment and Method: Stylet and Video-laryngoscopy Placement Confirmation: breath sounds checked- equal and bilateral,  CO2 detector,  positive ETCO2 and ETT inserted through vocal cords under direct vision Secured at: 24 cm Tube secured with: Tape Dental Injury: Teeth and Oropharynx as per pre-operative assessment

## 2014-12-02 NOTE — Brief Op Note (Signed)
12/02/2014  10:16 AM  PATIENT:  Curtis Barton  59 y.o. male  PRE-OPERATIVE DIAGNOSIS:  OA RIGHT HIP  POST-OPERATIVE DIAGNOSIS:  OA RIGHT HIP  PROCEDURE:  Procedure(s): TOTAL HIP ARTHROPLASTY (Right)  SURGEON:  Surgeon(s) and Role:    * Frederico Hammananiel Caffrey, MD - Primary  PHYSICIAN ASSISTANT: Margart SicklesJoshua Aerin Delany, PA-C  ASSISTANTS: +1 OR STAFF  ANESTHESIA:   local and general  EBL:  Total I/O In: 2450 [I.V.:2200; IV Piggyback:250] Out: 800 [Blood:800]  BLOOD ADMINISTERED:none  DRAINS: none   LOCAL MEDICATIONS USED:  MARCAINE     SPECIMEN:  No Specimen  DISPOSITION OF SPECIMEN:  N/A  COUNTS:  YES  TOURNIQUET:  * No tourniquets in log *  DICTATION: .Other Dictation: Dictation Number unknown  PLAN OF CARE: Admit to inpatient   PATIENT DISPOSITION:  PACU - hemodynamically stable.   Delay start of Pharmacological VTE agent (>24hrs) due to surgical blood loss or risk of bleeding: yes

## 2014-12-02 NOTE — Progress Notes (Signed)
Called Dr Gentry RochJudd regarding patients consistent increase in pain, level 8/10. Oral pain medications given along with total of 2mg  of IV Dilaudid. No new orders given at this time.

## 2014-12-02 NOTE — Interval H&P Note (Signed)
History and Physical Interval Note:  12/02/2014 7:25 AM  Curtis Barton  has presented today for surgery, with the diagnosis of OA RIGHT HIP  The various methods of treatment have been discussed with the patient and family. After consideration of risks, benefits and other options for treatment, the patient has consented to  Procedure(s): TOTAL HIP ARTHROPLASTY (Right) as a surgical intervention .  The patient's history has been reviewed, patient examined, no change in status, stable for surgery.  I have reviewed the patient's chart and labs.  Questions were answered to the patient's satisfaction.     Delsin Copen JR,W D

## 2014-12-02 NOTE — Op Note (Signed)
NAMMarland Kitchen:  Curtis Barton, Curtis Barton              ACCOUNT NO.:  [id number]  MEDICAL RECORD NO.:  [id number]  LOCATION:  [id number]                        FACILITY:  [facility]  PHYSICIAN:  Dyke BrackettW. D. Mliss Wedin, M.D.    DATE OF BIRTH:  08/17/1955  DATE OF PROCEDURE:  12/02/2014 DATE OF DISCHARGE:                              OPERATIVE REPORT   PREOPERATIVE DIAGNOSES:  Severe osteoarthritis, right hip.  POSTOPERATIVE DIAGNOSIS:  Severe osteoarthritis, right hip.  OPERATION:  Right total hip replacement (AML 16.5 mm small stature stem +8 mm neck length 36 mm hip ball, 58 mm acetabulum with a 36 mm ceramic head).  SURGEON:  Dyke BrackettW. D. Braxson Hollingsworth, M.D.  ASSISTANT:  Margart SicklesJoshua Chadwell, PA-C.  BLOOD LOSS:  __________Approximately 800 mL.  ANESTHESIA:  General anesthetic.  DESCRIPTION OF PROCEDURE:  Sterilely prepped and draped in the lateral position, posterior approach to the hip made.  The patient had iliotibial band split with gluteus maximus fascia split.  The patient had a relatively large __________ made about 6 __________ 285 pounds, delivered more of an extension on the approach and actually had to release a portion of the gluteus maximus insertion to allow exposure and placement of the reamers.  There is a lot of soft tissue and debris around the acetabulum.  We did a pretty extensive capsulectomy to gain exposure to the acetabulum, placed 2 wing retractors on that.  Prior to this, we cut the femur about 1 fingerbreadth above the lesser trochanter and then progressively broached to accept the 0.5 mm stem.  The broach was left in place.  We then approached the acetabulum, placed retractors anteriorly inferiorly, placed 2 wing retractors superiorly and posteriorly and progressively reamed to 55 for 1 mm under reaming of the acetabulum and placed the acetabular cup in approximately 45 degrees abduction with probably 15 degrees of anteversion.  One screw was inserted for additional fixation although good stability was  obtained even prior to that, and placed a trial cup, trailed off the femoral broach.  We got up to a +8.5 mm neck length, where we could flex the hip to 90-100, complete adduction.  Stability was good at about 45 degrees. There was just beginnings of instability and then when we tried the neck length above, we could not really relocate the hip, therefore, we settled with 8.5 mm neck length.  Final acetabulum was inserted followed by the final femur, we then trailed again with the same neck length, was accepted, placed a ceramic head onto the prosthesis.  Copious irrigation was used throughout the case.  We closed the fascia with #1 Ethibond.  The subcu tissues with 0 and 2-0 Vicryl and skin clips on the skin.  Marcaine with epinephrine on the skin.  Lightly compressive sterile dressing and knee immobilizer applied.  Taken to the recovery room in a stable condition.     Dyke BrackettW. D. Toyia Jelinek, M.D.     WDC/MEDQ  D:  12/02/2014  T:  12/02/2014  Job:  [id number]

## 2014-12-03 LAB — CBC
HEMATOCRIT: 34.1 % — AB (ref 39.0–52.0)
HEMOGLOBIN: 11.5 g/dL — AB (ref 13.0–17.0)
MCH: 32.9 pg (ref 26.0–34.0)
MCHC: 33.7 g/dL (ref 30.0–36.0)
MCV: 97.4 fL (ref 78.0–100.0)
Platelets: 183 10*3/uL (ref 150–400)
RBC: 3.5 MIL/uL — ABNORMAL LOW (ref 4.22–5.81)
RDW: 13.3 % (ref 11.5–15.5)
WBC: 10.9 10*3/uL — ABNORMAL HIGH (ref 4.0–10.5)

## 2014-12-03 LAB — BASIC METABOLIC PANEL
Anion gap: 9 (ref 5–15)
BUN: 11 mg/dL (ref 6–20)
CHLORIDE: 103 mmol/L (ref 101–111)
CO2: 26 mmol/L (ref 22–32)
CREATININE: 1.06 mg/dL (ref 0.61–1.24)
Calcium: 8.9 mg/dL (ref 8.9–10.3)
GFR calc non Af Amer: >60 mL/min (ref >60)
Glucose, Bld: 123 mg/dL — ABNORMAL HIGH (ref 65–99)
Potassium: 4.5 mmol/L (ref 3.5–5.1)
Sodium: 138 mmol/L (ref 135–145)

## 2014-12-03 NOTE — Progress Notes (Signed)
Pt stated he felt like his heart was "fluttering for a few seconds and then felt fine." Ekg obtained. Will cont to monitor pt.

## 2014-12-03 NOTE — Progress Notes (Signed)
Subjective: 1 Day Post-Op Procedure(s) (LRB): TOTAL HIP ARTHROPLASTY (Right) Patient reports pain as moderate.  Main issue last night was heart palpitations which lasted less than one minute according to patient.  No episodes since then.  No chest pain/sob, lightheadedness/dizziness, nausea/vomiting.  Tolerating diet.    Objective: Vital signs in last 24 hours: Temp:  [97 F (36.1 C)-98.2 F (36.8 C)] 98.2 F (36.8 C) (10/22 0511) Pulse Rate:  [61-76] 69 (10/22 0511) Resp:  [8-20] 16 (10/22 0511) BP: (107-152)/(60-93) 107/60 mmHg (10/22 0511) SpO2:  [93 %-100 %] 100 % (10/22 0511)  Intake/Output from previous day: 10/21 0701 - 10/22 0700 In: 2930 [P.O.:480; I.V.:2200; IV Piggyback:250] Out: 2650 [Urine:1850; Blood:800] Intake/Output this shift: Total I/O In: 240 [P.O.:240] Out: -    Recent Labs  12/03/14 0400  HGB 11.5*    Recent Labs  12/03/14 0400  WBC 10.9*  RBC 3.50*  HCT 34.1*  PLT 183    Recent Labs  12/03/14 0400  NA 138  K 4.5  CL 103  CO2 26  BUN 11  CREATININE 1.06  GLUCOSE 123*  CALCIUM 8.9    Recent Labs  12/02/14 0616  INR 1.13    Neurologically intact Neurovascular intact Sensation intact distally Intact pulses distally Dorsiflexion/Plantar flexion intact Incision: moderate drainage No cellulitis present Compartment soft  Negative homans bilaterally Dressing changed by me today  Assessment/Plan: 1 Day Post-Op Procedure(s) (LRB): TOTAL HIP ARTHROPLASTY (Right) Advance diet Up with therapy D/C IV fluids Discharge home with home health tomorrow  WBAT RLE-posterior hip precuations Dry dressing change prn ABLA-mild and stable   Otilio SaberM Lindsey Stanbery 12/03/2014, 10:08 AM

## 2014-12-03 NOTE — Care Management Note (Addendum)
Case Management Note  Patient Details  Name: Curtis Barton MRN: [id number] Date of Birth: 05/04/1955  Subjective/Objective:                  s/p Rt total hip arthroplasty via posterior approach. Hx of atrial flutter, HTN, and Angina pectoris.  Action/Plan: CM spoke to patient and spouse at the bedside. CM offered pt choice of HH PT agency and [name] and had some issues before with equipment but all of that was resolved.[name] and would revisit on 12/04/14 about what agency he would like to use. Pt has a shower chair at home, BSC, Crutches, and walker. No further DME needs. Pts wife and daughter will be available for intermittent supervision.  CM will continue to follow for discharge planning needs. Pt has concerns about navigating stairs and shower as he has a Tub/shower.   Expected Discharge Date:  12/04/14               Expected Discharge Plan:  Home w Home Health Services  In-House Referral:     Discharge planning Services  CM Consult  Post Acute Care Choice:  Home Health Choice offered to:  Patient, Spouse  DME Arranged:    DME Agency:     HH Arranged:  PT HH Agency:    Status of Service:  In process, will continue to follow  Medicare Important Message Given:    Date Medicare IM Given:    Medicare IM give by:    Date Additional Medicare IM Given:    Additional Medicare Important Message give by:     If discussed at Long Length of Stay Meetings, dates discussed:    Additional Comments:  Darcel SmallingAnna C Zamyra Allensworth, RN 12/03/2014, 5:12 PM

## 2014-12-03 NOTE — Progress Notes (Signed)
Physical Therapy Treatment Patient Details Name: Corrinne EagleShannon H Geisler MRN: [id number] DOB: 1955/09/17 Today's Date: 12/03/2014    History of Present Illness 59 y.o. male s/p Rt total hip arthroplasty via posterior approach. Hx of atrial flutter, HTN, and Angina pectoris.    PT Comments    Pt making steady progress toward goals with increased gait distance today and increased activity (exercises) performed. Overall less assistance needed than on evaluation session. Acute PT to continue working toward goals.   Follow Up Recommendations  Home health PT;Supervision for mobility/OOB     Equipment Recommendations  None recommended by PT    Recommendations for Other Services OT consult     Precautions / Restrictions Precautions Precautions: Posterior Hip;Fall Precaution Booklet Issued: No Precaution Comments: pt able to recall 3/3 posterior hip precautions Required Braces or Orthoses: Knee Immobilizer - Right Restrictions Weight Bearing Restrictions: Yes RLE Weight Bearing: Weight bearing as tolerated    Mobility  Bed Mobility Overal bed mobility: Needs Assistance Bed Mobility: Supine to Sit     Supine to sit: Min guard     General bed mobility comments: with HOB elevated ~30 degrees and rails used. cues on sequence and technique so to adhere to precautions. belt used to move right leg to and off edge of bed.  Transfers Overall transfer level: Needs assistance Equipment used: Rolling walker (2 wheeled) Transfers: Sit to/from Stand Sit to Stand: Min guard;From elevated surface         General transfer comment: cues on hand and right leg position with standing from bed and sitting to recliner  Ambulation/Gait Ambulation/Gait assistance: Min guard;Supervision Ambulation Distance (Feet): 50 Feet Assistive device: Rolling walker (2 wheeled) Gait Pattern/deviations: Step-to pattern;Decreased step length - left;Decreased stance time - right;Decreased weight shift to  right;Antalgic Gait velocity: decreased Gait velocity interpretation: Below normal speed for age/gender General Gait Details: cues on posture, walker use and position with turning and to progress from step to pattern to step through pattern.       Cognition Arousal/Alertness: Awake/alert Behavior During Therapy: WFL for tasks assessed/performed Overall Cognitive Status: Within Functional Limits for tasks assessed          Exercises Total Joint Exercises Ankle Circles/Pumps: AROM;Both;10 reps;Supine Quad Sets: AROM;Strengthening;Both;10 reps;Supine Short Arc Quad: AROM;Strengthening;Right;10 reps;Supine Heel Slides: AAROM;Strengthening;Right;10 reps;Supine Hip ABduction/ADduction: AAROM;Strengthening;Right;10 reps;Supine     Pertinent Vitals/Pain Pain Assessment: 0-10 Pain Score: 3  (6/10 after session) Pain Location: right hip Pain Descriptors / Indicators: Constant Pain Intervention(s): Monitored during session;Premedicated before session;Limited activity within patient's tolerance;Repositioned    Home Living Family/patient expects to be discharged to:: Private residence Living Arrangements: Spouse/significant other;Children Available Help at Discharge: Family Type of Home: House Home Access: Stairs to enter Entrance Stairs-Rails: None Home Layout: One level Home Equipment: Environmental consultantWalker - 2 wheels;Bedside commode;Shower seat;Crutches;Cane - quad;Adaptive equipment      Prior Function Level of Independence: Independent with assistive device(s)      Comments: occasionally uses cane   PT Goals (current goals can now be found in the care plan section) Acute Rehab PT Goals Patient Stated Goal: not stated PT Goal Formulation: With patient Time For Goal Achievement: 12/09/14 Potential to Achieve Goals: Good Progress towards PT goals: Progressing toward goals    Frequency  7X/week    PT Plan Current plan remains appropriate    End of Session Equipment Utilized During  Treatment: Gait belt Activity Tolerance: Patient tolerated treatment well Patient left: in chair;with call bell/phone within reach;with nursing/sitter in room  Time: 6578-4696 PT Time Calculation (min) (ACUTE ONLY): 31 min  Charges:  $Gait Training: 8-22 mins $Therapeutic Exercise: 8-22 mins                     Sallyanne Kuster 12/03/2014, 12:52 PM  Sallyanne Kuster, PTA, CLT Acute Rehab Services [phone] @, 12:53 PM

## 2014-12-03 NOTE — Discharge Instructions (Signed)
INSTRUCTIONS AFTER JOINT REPLACEMENT  ° °o Remove items at home which could result in a fall. This includes throw rugs or furniture in walking pathways °o ICE to the affected joint every three hours while awake for 30 minutes at a time, for at least the first 3-5 days, and then as needed for pain and swelling.  Continue to use ice for pain and swelling. You may notice swelling that will progress down to the foot and ankle.  This is normal after surgery.  Elevate your leg when you are not up walking on it.   °o Continue to use the breathing machine you got in the hospital (incentive spirometer) which will help keep your temperature down.  It is common for your temperature to cycle up and down following surgery, especially at night when you are not up moving around and exerting yourself.  The breathing machine keeps your lungs expanded and your temperature down. ° ° °DIET:  As you were doing prior to hospitalization, we recommend a well-balanced diet. ° °DRESSING / WOUND CARE / SHOWERING ° °You may change your dressing 3-5 days after surgery.  Then change the dressing every day with sterile gauze.  Please use good hand washing techniques before changing the dressing.  Do not use any lotions or creams on the incision until instructed by your surgeon. ° °ACTIVITY ° °o Increase activity slowly as tolerated, but follow the weight bearing instructions below.   °o No driving for 6 weeks or until further direction given by your physician.  You cannot drive while taking narcotics.  °o No lifting or carrying greater than 10 lbs. until further directed by your surgeon. °o Avoid periods of inactivity such as sitting longer than an hour when not asleep. This helps prevent blood clots.  °o You may return to work once you are authorized by your doctor.  ° ° ° °WEIGHT BEARING  ° °Weight bearing as tolerated with assist device (walker, cane, etc) as directed, use it as long as suggested by your surgeon or therapist, typically at  least 4-6 weeks. ° ° °EXERCISES ° °Results after joint replacement surgery are often greatly improved when you follow the exercise, range of motion and muscle strengthening exercises prescribed by your doctor. Safety measures are also important to protect the joint from further injury. Any time any of these exercises cause you to have increased pain or swelling, decrease what you are doing until you are comfortable again and then slowly increase them. If you have problems or questions, call your caregiver or physical therapist for advice.  ° °Rehabilitation is important following a joint replacement. After just a few days of immobilization, the muscles of the leg can become weakened and shrink (atrophy).  These exercises are designed to build up the tone and strength of the thigh and leg muscles and to improve motion. Often times heat used for twenty to thirty minutes before working out will loosen up your tissues and help with improving the range of motion but do not use heat for the first two weeks following surgery (sometimes heat can increase post-operative swelling).  ° °These exercises can be done on a training (exercise) mat, on the floor, on a table or on a bed. Use whatever works the best and is most comfortable for you.    Use music or television while you are exercising so that the exercises are a pleasant break in your day. This will make your life better with the exercises acting as a break   in your routine that you can look forward to.   Perform all exercises about fifteen times, three times per day or as directed.  You should exercise both the operative leg and the other leg as well. ° °Exercises include: °  °• Quad Sets - Tighten up the muscle on the front of the thigh (Quad) and hold for 5-10 seconds.   °• Straight Leg Raises - With your knee straight (if you were given a brace, keep it on), lift the leg to 60 degrees, hold for 3 seconds, and slowly lower the leg.  Perform this exercise against  resistance later as your leg gets stronger.  °• Leg Slides: Lying on your back, slowly slide your foot toward your buttocks, bending your knee up off the floor (only go as far as is comfortable). Then slowly slide your foot back down until your leg is flat on the floor again.  °• Angel Wings: Lying on your back spread your legs to the side as far apart as you can without causing discomfort.  °• Hamstring Strength:  Lying on your back, push your heel against the floor with your leg straight by tightening up the muscles of your buttocks.  Repeat, but this time bend your knee to a comfortable angle, and push your heel against the floor.  You may put a pillow under the heel to make it more comfortable if necessary.  ° °A rehabilitation program following joint replacement surgery can speed recovery and prevent re-injury in the future due to weakened muscles. Contact your doctor or a physical therapist for more information on knee rehabilitation.  ° ° °CONSTIPATION ° °Constipation is defined medically as fewer than three stools per week and severe constipation as less than one stool per week.  Even if you have a regular bowel pattern at home, your normal regimen is likely to be disrupted due to multiple reasons following surgery.  Combination of anesthesia, postoperative narcotics, change in appetite and fluid intake all can affect your bowels.  ° °YOU MUST use at least one of the following options; they are listed in order of increasing strength to get the job done.  They are all available over the counter, and you may need to use some, POSSIBLY even all of these options:   ° °Drink plenty of fluids (prune juice may be helpful) and high fiber foods °Colace 100 mg by mouth twice a day  °Senokot for constipation as directed and as needed Dulcolax (bisacodyl), take with full glass of water  °Miralax (polyethylene glycol) once or twice a day as needed. ° °If you have tried all these things and are unable to have a bowel  movement in the first 3-4 days after surgery call either your surgeon or your primary doctor.   ° °If you experience loose stools or diarrhea, hold the medications until you stool forms back up.  If your symptoms do not get better within 1 week or if they get worse, check with your doctor.  If you experience "the worst abdominal pain ever" or develop nausea or vomiting, please contact the office immediately for further recommendations for treatment. ° ° °ITCHING:  If you experience itching with your medications, try taking only a single pain pill, or even half a pain pill at a time.  You can also use Benadryl over the counter for itching or also to help with sleep.  ° °TED HOSE STOCKINGS:  Use stockings on both legs until for at least 2 weeks or as   directed by physician office. They may be removed at night for sleeping.  MEDICATIONS:  See your medication summary on the After Visit Summary that nursing will review with you.  You may have some home medications which will be placed on hold until you complete the course of blood thinner medication.  It is important for you to complete the blood thinner medication as prescribed.  PRECAUTIONS:  If you experience chest pain or shortness of breath - call 911 immediately for transfer to the hospital emergency department.   If you develop a fever greater that 101 F, purulent drainage from wound, increased redness or drainage from wound, foul odor from the wound/dressing, or calf pain - CONTACT YOUR SURGEON.                                                   FOLLOW-UP APPOINTMENTS:  If you do not already have a post-op appointment, please call the office for an appointment to be seen by your surgeon.  Guidelines for how soon to be seen are listed in your After Visit Summary, but are typically between 1-4 weeks after surgery.  OTHER INSTRUCTIONS:   Knee Replacement:  Do not place pillow under knee, focus on keeping the knee straight while resting. CPM  instructions: 0-90 degrees, 2 hours in the morning, 2 hours in the afternoon, and 2 hours in the evening. Place foam block, curve side up under heel at all times except when in CPM or when walking.  DO NOT modify, tear, cut, or change the foam block in any way.  MAKE SURE YOU:   Understand these instructions.   Get help right away if you are not doing well or get worse.    Thank you for letting us be a part of your medical care team.  It is a privilege we respect greatly.  We hope these instructions will help you stay on track for a fast and full recovery!    Information on my medicine - ELIQUIS (apixaban)  This medication education was reviewed with me or my healthcare representative as part of my discharge preparation.    Why was Eliquis prescribed for you? Eliquis was prescribed for you to reduce the risk of a blood clot forming that can cause a stroke if you have a medical condition called atrial fibrillation (a type of irregular heartbeat).  What do You need to know about Eliquis ? Take your Eliquis 5mg  TWICE DAILY - one tablet in the morning and one tablet in the evening with or without food. If you have difficulty swallowing the tablet whole please discuss with your pharmacist how to take the medication safely.  Take Eliquis exactly as prescribed by your doctor and DO NOT stop taking Eliquis without talking to the doctor who prescribed the medication.  Stopping may increase your risk of developing a stroke.  Refill your prescription before you run out.  After discharge, you should have regular check-up appointments with your healthcare provider that is prescribing your Eliquis.  In the future your dose may need to be changed if your kidney function or weight changes by a significant amount or as you get older.  What do you do if you miss a dose? If you miss a dose, take it as soon as you remember on the same day and resume taking twice daily.  Do not take more than one dose  of ELIQUIS at the same time to make up a missed dose.  Important Safety Information A possible side effect of Eliquis is bleeding. You should call your healthcare provider right away if you experience any of the following: ? Bleeding from an injury or your nose that does not stop. ? Unusual colored urine (red or dark brown) or unusual colored stools (red or black). ? Unusual bruising for unknown reasons. ? A serious fall or if you hit your head (even if there is no bleeding).  Some medicines may interact with Eliquis and might increase your risk of bleeding or clotting while on Eliquis. To help avoid this, consult your healthcare provider or pharmacist prior to using any new prescription or non-prescription medications, including herbals, vitamins, non-steroidal anti-inflammatory drugs (NSAIDs) and supplements.  This website has more information on Eliquis (apixaban): http://www.eliquis.com/eliquis/home

## 2014-12-03 NOTE — Progress Notes (Signed)
Physical Therapy Treatment Patient Details Name: Curtis EagleShannon H Dalgleish MRN: [id number] DOB: [birth year] Today's Date: 12/03/2014    History of Present Illness 59 y.o. male s/p Rt total hip arthroplasty via posterior approach. Hx of atrial flutter, HTN, and Angina pectoris.    PT Comments    Pt sleepy this pm, falling asleep during exercises multiple times. Increased pain with exercises this pm as well. Pt able to complete them with same assistance as with previous session, needed increased time due to needing cues to wake up/stay on task. OOB mobility held due to pt's sleepiness. Will continue to work towards goals during pt's acute stay. Plan for stair education tomorrow due to not able to complete this during this session.   Follow Up Recommendations  Home health PT;Supervision for mobility/OOB     Equipment Recommendations  None recommended by PT    Recommendations for Other Services OT consult     Precautions / Restrictions Precautions Precautions: Posterior Hip;Fall Precaution Comments: pt able to recall 3/3 posterior hip precautions Required Braces or Orthoses: Knee Immobilizer - Right Restrictions Weight Bearing Restrictions: Yes RLE Weight Bearing: Weight bearing as tolerated       Cognition Arousal/Alertness: Awake/alert Behavior During Therapy: WFL for tasks assessed/performed Overall Cognitive Status: Within Functional Limits for tasks assessed         Exercises Total Joint Exercises Ankle Circles/Pumps: AROM;Both;10 reps;Supine Quad Sets: AROM;Strengthening;Right;10 reps;Supine Short Arc Quad: AROM;Strengthening;Right;10 reps;Supine Hip ABduction/ADduction: AAROM;Strengthening;Right;10 reps;Supine     Pertinent Vitals/Pain Pain Assessment: 0-10 Pain Score: 3  (6/10 after session) Pain Location: right hip Pain Descriptors / Indicators: Aching;Sore;Constant Pain Intervention(s): Monitored during session;Premedicated before session;Ice applied;Repositioned      PT Goals (current goals can now be found in the care plan section) Acute Rehab PT Goals Patient Stated Goal: not stated PT Goal Formulation: With patient Time For Goal Achievement: 12/09/14 Potential to Achieve Goals: Good Progress towards PT goals: Progressing toward goals    Frequency  7X/week    PT Plan Current plan remains appropriate    End of Session Equipment Utilized During Treatment: Gait belt Activity Tolerance: Patient limited by pain;Patient limited by fatigue Patient left: in bed;with call bell/phone within reach;with SCD's reapplied     Time: 1401-1413 PT Time Calculation (min) (ACUTE ONLY): 12 min  Charges:  $Gait Training: 8-22 mins $Therapeutic Exercise: 8-22 mins                     Sallyanne KusterBury, Kathy 12/03/2014, 2:14 PM  Sallyanne KusterKathy Bury, PTA, CLT Acute Rehab Services [phone] 12/03/14, 2:17 PM

## 2014-12-03 NOTE — Discharge Summary (Signed)
Patient ID: Curtis Barton MRN: [id number] DOB/AGE: 1956-01-23 59 y.o.  Admit date: 12/02/2014 Discharge date: 12/04/2014  Admission Diagnoses:  Active Problems:   Primary localized osteoarthritis of right hip   Discharge Diagnoses:  Same  Past Medical History  Diagnosis Date  . Hypertension   . Arthritis   . TIA (transient ischemic attack) 2010  . Atrial flutter (HCC)     a. s/p ablation  . Primary localized osteoarthritis of right knee   . Asthma     as child  . Cancer (HCC)   . Fuchs' uveitis syndrome     left eye  . Difficult intubation     maybe 8 years years ago, no trouble since then  . Dysrhythmia   . Heart murmur   . Stroke (HCC)   . Sleep apnea     home sleep study shows some sleep apnea  . Kidney stone     Surgeries: Procedure(s): TOTAL HIP ARTHROPLASTY on 12/02/2014   Consultants:    Discharged Condition: Improved  Hospital Course: Curtis Barton is an 59 y.o. male who was admitted 12/02/2014 for operative treatment of primary localized osteoarthritis right pelvic region. Patient has severe unremitting pain that affects sleep, daily activities, and work/hobbies. After pre-op clearance the patient was taken to the operating room on 12/02/2014 and underwent  Procedure(s): TOTAL HIP ARTHROPLASTY.  Patient with a pre-op Hb of 14.2 developed ABLA on pod#1 with a Hb of 11.5 and 10.3 on pod#2.  He is currently stable but we will continue to follow.  Patient was given perioperative antibiotics:      Anti-infectives    Start     Dose/Rate Route Frequency Ordered Stop   12/02/14 1400  ceFAZolin (ANCEF) IVPB 2 g/50 mL premix     2 g 100 mL/hr over 30 Minutes Intravenous Every 6 hours 12/02/14 1112 12/02/14 2332   12/02/14 0600  ceFAZolin (ANCEF) 3 g in dextrose 5 % 50 mL IVPB     3 g 160 mL/hr over 30 Minutes Intravenous On call to O.R. 12/01/14 1333 12/02/14 0753   12/02/14 0544  ceFAZolin (ANCEF) IVPB 2 g/50 mL premix  Status:  Discontinued     2  g 100 mL/hr over 30 Minutes Intravenous On call to O.R. 12/02/14 0544 12/02/14 1220       Patient was given sequential compression devices, early ambulation, and chemoprophylaxis to prevent DVT.  Patient benefited maximally from hospital stay and there were no complications.    Recent vital signs:  Patient Vitals for the past 24 hrs:  BP Temp Temp src Pulse Resp SpO2  12/04/14 0602 122/62 mmHg 98.5 F (36.9 C) Oral 68 18 94 %  12/03/14 2012 (!) 99/47 mmHg 97.4 F (36.3 C) Oral 81 18 97 %  12/03/14 1401 (!) 116/59 mmHg 98.7 F (37.1 C) Oral 74 17 98 %  12/03/14 1243 110/62 mmHg - - 69 - -  12/03/14 1000 110/62 mmHg 98.7 F (37.1 C) Oral 69 17 96 %     Recent laboratory studies:   Recent Labs  12/02/14 0616 12/03/14 0400 12/04/14 0513  WBC  --  10.9* 9.3  HGB  --  11.5* 10.3*  HCT  --  34.1* 31.7*  PLT  --  183 157  NA  --  138  --   K  --  4.5  --   CL  --  103  --   CO2  --  26  --   BUN  --  11  --   CREATININE  --  1.06  --   GLUCOSE  --  123*  --   INR 1.13  --   --   CALCIUM  --  8.9  --      Discharge Medications:     Medication List    STOP taking these medications        tapentadol 50 MG Tabs tablet  Commonly known as:  NUCYNTA      TAKE these medications        ACETAMINOPHEN 8 HOUR 650 MG CR tablet  Generic drug:  acetaminophen  Take 1,300 mg by mouth as needed for pain.     apixaban 5 MG Tabs tablet  Commonly known as:  ELIQUIS  Take 1 tablet (5 mg total) by mouth 2 (two) times daily.     atenolol 25 MG tablet  Commonly known as:  TENORMIN  Take one tablet by mouth twice daily as directed     cyclobenzaprine 10 MG tablet  Commonly known as:  FLEXERIL  Take 10 mg by mouth 3 (three) times daily as needed for muscle spasms.     HYDROmorphone 2 MG tablet  Commonly known as:  DILAUDID  1-2 tabs po q4-6hrs prn pain        Diagnostic Studies: Dg Chest 2 View  11/17/2014  CLINICAL DATA:  Preoperative exam prior to hip replacement,  history of cardiac dysrhythmia, previous history of tobacco use. EXAM: CHEST  2 VIEW COMPARISON:  PA and lateral chest x-ray of November 17, 2013 FINDINGS: The lungs are adequately inflated. There is no focal infiltrate. There is no pleural effusion. The interstitial markings are mildly increased but stable. There is stable apical pleural thickening bilaterally. The heart is normal in size. The mediastinum is normal in width. There is no pulmonary vascular congestion. The implantable cardiac monitor is in reasonable position in the left parasternal soft tissues. IMPRESSION: There is no active cardiopulmonary disease. Electronically Signed   By: David  SwazilandJordan M.D.   On: 11/17/2014 09:34   Ct Abdomen Pelvis W Contrast  11/17/2014  CLINICAL DATA:  Right-sided abdominal pain ; abnormal bowel sounds EXAM: CT ABDOMEN AND PELVIS WITH CONTRAST TECHNIQUE: Multidetector CT imaging of the abdomen and pelvis was performed using the standard protocol following bolus administration of intravenous contrast. CONTRAST:  125 mL Isovue-300 nonionic COMPARISON:  None. FINDINGS: Lower chest: There is mild right base atelectasis. Lung bases otherwise are clear. Hepatobiliary: No focal liver lesions are identified. There is no appreciable gallbladder wall thickening. There is no biliary duct dilatation. Pancreas: No mass or inflammatory focus. Spleen: There is a small calcified granuloma in the medial spleen. Spleen otherwise appears unremarkable. A tiny splenule is noted inferior to the spleen. Adrenals/Urinary Tract: Adrenals appear normal bilaterally. There is extensive scarring in the right kidney. There is a cyst arising from the upper pole of the right kidney posteriorly measuring 3.1 x 3.1 cm. There is an extrarenal pelvis on the right. There are surgical clips at the level of the right ureteropelvic junction. There is no demonstrable renal or ureteral calculus on the right. On the left, there is a degree of compensatory  hypertrophy of the kidney. There is no left renal mass or hydronephrosis. There is no left-sided renal or ureteral calculus. There is an extrarenal pelvis on the left, anatomic variant. The urinary bladder is midline with normal wall thickness. Stomach/Bowel: There is no bowel wall or mesenteric thickening. No bowel obstruction. No free air  or portal venous air. There are occasional colonic diverticula without diverticulitis. Vascular/Lymphatic: There is atherosclerotic change in the abdominal aorta and iliac arteries without aneurysm. Mild peripheral thrombus is noted in the mid to distal aorta. There is no periaortic fluid. There is no appreciable adenopathy in the abdomen or pelvis. Reproductive: Prostate is normal in size and contour. There is no pelvic mass or pelvic fluid collection. Other: Appendix appears normal. No abscess or ascites in the abdomen or pelvis. There is a minimal ventral hernia containing only fat. Musculoskeletal: No intramuscular or abdominal wall lesion. There is degenerative change in the lumbar spine with vacuum phenomenon at L5-S1. There are no blastic or lytic bone lesions. IMPRESSION: Scarring right kidney. Dilatation of an extrarenal pelvis is noted. There are surgical clips at the right ureteropelvic junction. Question a degree of chronic ureteropelvic junction obstruction. No intrarenal or ureteral calculi are noted on either side. No bowel obstruction. No abscess. Appendix appears normal. Minimal ventral hernia containing only fat. Electronically Signed   By: Bretta Bang III M.D.   On: 11/17/2014 10:09   Dg Pelvis Portable  12/02/2014  CLINICAL DATA:  Status post right total hip arthroplasty EXAM: PORTABLE PELVIS 1-2 VIEWS COMPARISON:  None. FINDINGS: Well seated femoral and acetabular components of a total right hip arthroplasty. No complicating features are demonstrated. IMPRESSION: Well seated components of a total right hip arthroplasty. Electronically Signed   By:  Rudie Meyer M.D.   On: 12/02/2014 11:54    Disposition: 01-Home or Self Care    Follow-up Information    Follow up with CAFFREY JR,W D, MD. Schedule an appointment as soon as possible for a visit in 2 weeks.   Specialty:  Orthopedic Surgery   Contact information:   [address] [place] [address] [phone]        Signed: Otilio Saber 12/04/2014, 8:54 AM

## 2014-12-03 NOTE — Evaluation (Addendum)
Occupational Therapy Evaluation Patient Details Name: Curtis Barton MRN: [id number] DOB: 02/19/1955 Today's Date: 12/03/2014    History of Present Illness 59 y.o. male s/p Rt total hip arthroplasty via posterior approach. Hx of atrial flutter, HTN, and Angina pectoris.   Clinical Impression   Pt s/p above. Pt independent with ADLs, PTA. Feel pt will benefit from acute OT to increase independence prior to d/c.     Follow Up Recommendations  No OT follow up;Supervision - Intermittent    Equipment Recommendations  Other (comment) (wide sockaid and long reacher)    Recommendations for Other Services       Precautions / Restrictions Precautions Precautions: Posterior Hip;Fall Precaution Booklet Issued: No Precaution Comments: reviewed  precautions Restrictions Weight Bearing Restrictions: Yes RLE Weight Bearing: Weight bearing as tolerated      Mobility Bed Mobility Overal bed mobility: Needs Assistance Bed Mobility: Supine to Sit     Supine to sit: Supervision     General bed mobility comments: used rails and HOB elevated  Transfers Overall transfer level: Needs assistance Equipment used: Rolling walker (2 wheeled) Transfers: Sit to/from Stand Sit to Stand: Min guard;From elevated surface         General transfer comment: cues given for technique/hand placement.    Balance    No LOB in session. Used RW for ambulation and for sit to stand transfer.                                         ADL Overall ADL's : Needs assistance/impaired    Eating: Independent; sitting Grooming: Wash/dry hands;Set up;Supervision/safety;Standing               Lower Body Dressing: Minimal assistance;With adaptive equipment;Sit to/from stand   Toilet Transfer: Min guard;Ambulation;Comfort height toilet;RW Statistician Details (indicate cue type and reason): stood to urinate at toilet Toileting- Clothing Manipulation and Hygiene: Supervision/safety  (standing)       Functional mobility during ADLs: Min guard;Rolling walker General ADL Comments: Pt practiced with AE-discussed possibly getting a wider sockaid and longer reacher. Educated on safety such as use of bag on walker and rugs/items on floor.     Vision     Perception     Praxis      Pertinent Vitals/Pain Pain Assessment: 0-10 Pain Score: 3  Pain Location: right hip Pain Descriptors / Indicators: Constant Pain Intervention(s): Monitored during session;Repositioned     Hand Dominance Right   Extremity/Trunk Assessment Upper Extremity Assessment Upper Extremity Assessment: Overall WFL for tasks assessed   Lower Extremity Assessment Lower Extremity Assessment: Defer to PT evaluation       Communication Communication Communication: No difficulties   Cognition Arousal/Alertness: Awake/alert Behavior During Therapy: WFL for tasks assessed/performed Overall Cognitive Status: No family/caregiver present to determine baseline; seemed to have decreased safety awareness and picked up walker despite cues not to do so.                     General Comments       Exercises       Shoulder Instructions      Home Living Family/patient expects to be discharged to:: Private residence Living Arrangements: Spouse/significant other;Children Available Help at Discharge: Family Type of Home: House Home Access: Stairs to enter Secretary/administrator of Steps: 4+1+1 Entrance Stairs-Rails: None Home Layout: One level     Bathroom Shower/Tub:  Tub/shower unit         Home Equipment: Walker - 2 wheels;Bedside commode;Shower seat;Crutches;Cane - quad;Adaptive equipment Adaptive Equipment: Reacher;Long-handled shoe horn;Long-handled sponge;Sock aid        Prior Functioning/Environment Level of Independence: Independent with assistive device(s)        Comments: occasionally uses cane    OT Diagnosis: Acute pain   OT Problem List: Decreased safety  awareness;Decreased knowledge of use of DME or AE;Decreased knowledge of precautions;Decreased activity tolerance;Pain   OT Treatment/Interventions: Self-care/ADL training;DME and/or AE instruction;Patient/family education;Balance training;Therapeutic activities    OT Goals(Current goals can be found in the care plan section) Acute Rehab OT Goals Patient Stated Goal: not stated OT Goal Formulation: With patient Time For Goal Achievement: 12/10/14 Potential to Achieve Goals: Good ADL Goals Pt Will Perform Lower Body Dressing: with set-up;with adaptive equipment;sit to/from stand Pt Will Transfer to Toilet: with modified independence;ambulating (3 in 1 over commode) Pt Will Perform Tub/Shower Transfer: Tub transfer;with supervision;ambulating;shower seat;rolling walker Additional ADL Goal #1: Pt will perform bed mobility at Mod I level with HOB flat and no rails.  OT Frequency: Min 2X/week   Barriers to D/C:            Co-evaluation              End of Session Equipment Utilized During Treatment: Gait belt;Rolling walker;Other (comment) (AE) Nurse Communication: Other (comment) (check IV; urinated )  Activity Tolerance: Patient tolerated treatment well Patient left: in chair;with nursing/sitter in room   Time: 0910-0931 OT Time Calculation (min): 21 min Charges:  OT General Charges $OT Visit: 1 Procedure OT Evaluation $Initial OT Evaluation Tier I: 1 Procedure G-CodesEarlie Raveling:    Remi Rester L OTR/L [id number] 12/03/2014, 9:48 AM

## 2014-12-03 NOTE — Care Management Note (Signed)
Case Management Note  Patient Details  Name: Corrinne EagleShannon H Masso MRN: [id number] Date of Birth: April 07, 1955  Subjective/Objective:                  TOTAL HIP ARTHROPLASTY (Right)  Action/Plan: CM attempted to speak to patient at the bedside about discharge planning and patient said that he was too sleepy and to come back another time. HH PT reccommended. CM will further assess choice once family available and pt more alert.    Expected Discharge Date:  12/04/14              Expected Discharge Plan:  Home w Home Health Services  In-House Referral:     Discharge planning Services  CM Consult  Post Acute Care Choice:    Choice offered to:     DME Arranged:    DME Agency:     HH Arranged:    HH Agency:     Status of Service:  In process, will continue to follow  Medicare Important Message Given:    Date Medicare IM Given:    Medicare IM give by:    Date Additional Medicare IM Given:    Additional Medicare Important Message give by:     If discussed at Long Length of Stay Meetings, dates discussed:    Additional Comments:  Darcel SmallingAnna C Anntoinette Haefele, RN 12/03/2014, 3:24 PM

## 2014-12-04 LAB — CBC
HCT: 31.7 % — ABNORMAL LOW (ref 39.0–52.0)
HEMOGLOBIN: 10.3 g/dL — AB (ref 13.0–17.0)
MCH: 31.9 pg (ref 26.0–34.0)
MCHC: 32.5 g/dL (ref 30.0–36.0)
MCV: 98.1 fL (ref 78.0–100.0)
Platelets: 157 10*3/uL (ref 150–400)
RBC: 3.23 MIL/uL — AB (ref 4.22–5.81)
RDW: 13.2 % (ref 11.5–15.5)
WBC: 9.3 10*3/uL (ref 4.0–10.5)

## 2014-12-04 NOTE — Progress Notes (Signed)
Subjective: 2 Days Post-Op Procedure(s) (LRB): TOTAL HIP ARTHROPLASTY (Right) Patient reports pain as moderate, however he has had a very active morning.  Minimal heart palpitations last night.  No chest pain/sob, lightheadedness/dizziness, nausea/vomiting.  Positive flatus but no bm.  Tolerating diet.  Objective: Vital signs in last 24 hours: Temp:  [97.4 F (36.3 C)-98.7 F (37.1 C)] 98.5 F (36.9 C) (10/23 0602) Pulse Rate:  [68-81] 68 (10/23 0602) Resp:  [17-18] 18 (10/23 0602) BP: (99-122)/(47-62) 122/62 mmHg (10/23 0602) SpO2:  [94 %-98 %] 94 % (10/23 0602)  Intake/Output from previous day: 10/22 0701 - 10/23 0700 In: 840 [P.O.:720; I.V.:120] Out: 1400 [Urine:1400] Intake/Output this shift:     Recent Labs  12/03/14 0400 12/04/14 0513  HGB 11.5* 10.3*    Recent Labs  12/03/14 0400 12/04/14 0513  WBC 10.9* 9.3  RBC 3.50* 3.23*  HCT 34.1* 31.7*  PLT 183 157    Recent Labs  12/03/14 0400  NA 138  K 4.5  CL 103  CO2 26  BUN 11  CREATININE 1.06  GLUCOSE 123*  CALCIUM 8.9    Recent Labs  12/02/14 0616  INR 1.13    Neurologically intact Neurovascular intact Sensation intact distally Intact pulses distally Dorsiflexion/Plantar flexion intact Compartment soft  Negative homans bilaterally  Assessment/Plan: 2 Days Post-Op Procedure(s) (LRB): TOTAL HIP ARTHROPLASTY (Right) Advance diet Up with therapy D/C IV fluids Discharge home with home health tomorrow pending how well he does with PT.  Patient states he has several step to climb in his house and does not feel comfortable/confident at this time.  I have discussed this with PT. WBAT RLE-posterior hip precautions ABLA-mild and stable Dry dressing change prn  Curtis Barton 12/04/2014, 8:59 AM

## 2014-12-04 NOTE — Progress Notes (Signed)
Occupational Therapy Treatment Patient Details Name: Curtis Barton MRN: [id number] DOB: 1955-07-23 Today's Date: 12/04/2014    History of present illness 59 y.o. male s/p Rt total hip arthroplasty via posterior approach. Hx of atrial flutter, HTN, and Angina pectoris.   OT comments  Education provided in session. Pt limited by fatigue. Pt would like additional OT session today.  Follow Up Recommendations  No OT follow up;Supervision - Intermittent    Equipment Recommendations  None recommended by OT    Recommendations for Other Services      Precautions / Restrictions Precautions Precautions: Posterior Hip;Fall Precaution Comments: reviewed precautions Restrictions Weight Bearing Restrictions: Yes RLE Weight Bearing: Weight bearing as tolerated       Mobility Bed Mobility Overal bed mobility: Needs Assistance Bed Mobility: Supine to Sit     Supine to sit: Mod assist     General bed mobility comments: tried with HOB flat and cues for pt not to use rail. Pt able to use a blanket to move RLE and then OT assisted with RLE as well and also with trunk.  Transfers Overall transfer level: Needs assistance Equipment used: Rolling walker (2 wheeled) Transfers: Sit to/from Stand Sit to Stand: Min guard;From elevated surface         General transfer comment: cues given    Balance    Balance not formally assessed. Used RW for support for sit to stand and ambulation.                               ADL Overall ADL's : Needs assistance/impaired Eating/Feeding: Independent;Sitting Eating/Feeding Details (indicate cue type and reason): drinking liquid                 Lower Body Dressing: With adaptive equipment;Sitting/lateral leans;Minimal assistance   Toilet Transfer: Min guard;Ambulation;RW (sit to stand from bed)           Functional mobility during ADLs: Min guard;Rolling walker General ADL Comments: educated on LB dressing technique. Pt  practiced with reacher and sockaid-OT switched out pt's AE for wider sockaid and longer reacher. Discussed shower transfer techniques. Educated/reviewed safety tips such as use of bag on walker and rugs/items on floor.      Vision                     Perception     Praxis      Cognition  Awake/Alert Behavior During Therapy: WFL for tasks assessed/performed Overall Cognitive Status: Within Functional Limits for tasks assessed                       Extremity/Trunk Assessment               Exercises     Shoulder Instructions       General Comments      Pertinent Vitals/ Pain       Pain Assessment: 0-10 Pain Score: 4  Pain Location: right hip, shoulders and abdomen Pain Intervention(s): Monitored during session;Repositioned  Home Living                                          Prior Functioning/Environment              Frequency Min 2X/week     Progress Toward Goals  OT  Goals(current goals can now be found in the care plan section)  Progress towards OT goals: Progressing toward goals  Acute Rehab OT Goals Patient Stated Goal: not stated OT Goal Formulation: With patient Time For Goal Achievement: 12/10/14 Potential to Achieve Goals: Good ADL Goals Pt Will Perform Lower Body Dressing: with set-up;with adaptive equipment;sit to/from stand Pt Will Transfer to Toilet: with modified independence;ambulating (3 in 1 over commode) Pt Will Perform Tub/Shower Transfer: Tub transfer;with supervision;ambulating;shower seat;rolling walker Additional ADL Goal #1: Pt will perform bed mobility at Mod I level with HOB flat and no rails.  Plan Discharge plan remains appropriate    Co-evaluation                 End of Session Equipment Utilized During Treatment: Gait belt;Rolling walker;Other (comment) (AE)   Activity Tolerance Patient limited by fatigue   Patient Left in chair;with call bell/phone within reach;with  family/visitor present   Nurse Communication          Time: 1610-9604 OT Time Calculation (min): 21 min  Charges: OT General Charges $OT Visit: 1 Procedure OT Treatments $Self Care/Home Management : 8-22 mins  Earlie Raveling OTR/L [phone] 12/04/2014, 9:42 AM

## 2014-12-04 NOTE — Progress Notes (Signed)
Physical Therapy Treatment Patient Details Name: Curtis Barton MRN: [id number] DOB: August 22, 1955 Today's Date: 12/04/2014    History of Present Illness 59 y.o. male s/p Rt total hip arthroplasty via posterior approach. Hx of atrial flutter, HTN, and Angina pectoris.    PT Comments    Overall making good progress despite reports of continuing fatigue; We discussed the technique for going up and down one step; Plan for stair training next session; on track fo rdc tomorrow  Follow Up Recommendations  Home health PT;Supervision for mobility/OOB     Equipment Recommendations  None recommended by PT    Recommendations for Other Services       Precautions / Restrictions Precautions Precautions: Posterior Hip;Fall Precaution Comments: reviewed precautions Restrictions Weight Bearing Restrictions: Yes RLE Weight Bearing: Weight bearing as tolerated    Mobility  Bed Mobility Overal bed mobility: Needs Assistance Bed Mobility: Sit to Supine     Supine to sit: Mod assist Sit to supine: Mod assist   General bed mobility comments: Cues for technique; light mod assist to help RLE onto bed; increase time with inefficient movement; practiced without rail to approximate home  Transfers Overall transfer level: Needs assistance Equipment used: Rolling walker (2 wheeled) Transfers: Sit to/from Stand Sit to Stand: Min assist (from low recliner)         General transfer comment: cues for prec, hand placement, and technique  Ambulation/Gait Ambulation/Gait assistance: Min guard (without physical contact) Ambulation Distance (Feet): 60 Feet Assistive device: Rolling walker (2 wheeled) Gait Pattern/deviations: Step-through pattern Gait velocity: decreased   General Gait Details: Overall good gait pattern; cues mostly to self-54monitor for activiyt tolerance as he endorsed being extremely fatigued   Stairs            Wheelchair Mobility    Modified Rankin (Stroke  Patients Only)       Balance             Standing balance-Leahy Scale: Fair                      Cognition Arousal/Alertness: Awake/alert Behavior During Therapy: WFL for tasks assessed/performed Overall Cognitive Status: Within Functional Limits for tasks assessed                      Exercises Total Joint Exercises Quad Sets: AROM;Strengthening;Right;10 reps;Supine Gluteal Sets: AROM;Both;5 reps;Supine (pt to perform independently throughout the day) Towel Squeeze: AROM;Both;10 reps (monitored R hip for internal rotation) Heel Slides: AAROM;Strengthening;Right;10 reps;Supine Hip ABduction/ADduction: AAROM;Strengthening;Right;10 reps;Supine    General Comments        Pertinent Vitals/Pain Pain Assessment: 0-10 Pain Score: 7  Pain Location: R hip, after amb; subsided relatively quickly Pain Descriptors / Indicators: Aching;Discomfort;Sore Pain Intervention(s): Limited activity within patient's tolerance;Monitored during session;Patient requesting pain meds-RN notified;Repositioned    Home Living                      Prior Function            PT Goals (current goals can now be found in the care plan section) Acute Rehab PT Goals Patient Stated Goal: 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 PT Goal Formulation: With patient Time For Goal Achievement: 12/09/14 Potential to Achieve Goals: Good Progress towards PT goals: Progressing toward goals    Frequency  7X/week    PT Plan Current plan remains appropriate    Co-evaluation  End of Session Equipment Utilized During Treatment: Gait belt Activity Tolerance: Patient limited by pain;Patient limited by fatigue Patient left: in bed;with call bell/phone within reach (pt declined SCDs)     Time: [id number] PT Time Calculation (min) (ACUTE ONLY): 42 min  Charges:  $Gait Training: 8-22 mins $Therapeutic Activity: 8-22 mins $Neuromuscular Re-education: 8-22 mins                     G Codes:      Van ClinesGarrigan, Suhana Wilner Hamff 12/04/2014, 10:03 AM  Van ClinesHolly Lars Jeziorski, PT  Acute Rehabilitation Services Pager [phone] Office [phone]

## 2014-12-04 NOTE — Care Management Note (Addendum)
Case Management Note  Patient Details  Name: Curtis EagleShannon H Kopinski MRN: [id number] Date of Birth: [birth year]  Subjective/Objective:                  s/p Rt total hip arthroplasty via posterior approach. Hx of atrial flutter, HTN, and Angina pectoris.  Action/Plan: CM spoke to patient and spouse at the bedside again this morning. CM offered pt choice of HH PT agency as pt perviously said that [facility] and had some issues before with equipment but all of that was resolved so would be fine if he needed to use them. CM explained on 12/04/14 that [facility] and patient and spouse said [facility] were [facility]. CM explained they have their choice of agency and both said [facility] fine. Pt has a shower chair at home, BSC, Crutches, and walker. No further DME needs. Pts wife and daughter will be available for intermittent supervision. PT to see patient again tomorrow before discharge. CM called Amy with [facility] @ [phone] to advise of probably discharge home tomorrow. Referral accepted.  CM will continue to follow for discharge needs as pt has concerns about navigating shower and steps once home.  Expected Discharge Date:  12/05/14               Expected Discharge Plan:  Home w Home Health Services  In-House Referral:     Discharge planning Services  CM Consult  Post Acute Care Choice:  Home Health Choice offered to:  Patient, Spouse  DME Arranged:    DME Agency:     HH Arranged:  PT HH Agency:  [facility]  Status of Service:  In process, will continue to follow  Medicare Important Message Given:    Date Medicare IM Given:    Medicare IM give by:    Date Additional Medicare IM Given:    Additional Medicare Important Message give by:     If discussed at Long Length of Stay Meetings, dates discussed:    Additional Comments:  Darcel SmallingAnna C Stellah Donovan, RN 12/04/2014, 10:22 AM

## 2014-12-05 ENCOUNTER — Encounter (HOSPITAL_COMMUNITY): Payer: Self-pay | Admitting: Orthopedic Surgery

## 2014-12-05 LAB — CUP PACEART REMOTE DEVICE CHECK: MDC IDC SESS DTM: 20160930200524

## 2014-12-05 LAB — CBC
HCT: 30.5 % — ABNORMAL LOW (ref 39.0–52.0)
Hemoglobin: 10.1 g/dL — ABNORMAL LOW (ref 13.0–17.0)
MCH: 32.3 pg (ref 26.0–34.0)
MCHC: 33.1 g/dL (ref 30.0–36.0)
MCV: 97.4 fL (ref 78.0–100.0)
PLATELETS: 162 10*3/uL (ref 150–400)
RBC: 3.13 MIL/uL — ABNORMAL LOW (ref 4.22–5.81)
RDW: 13.3 % (ref 11.5–15.5)
WBC: 9.5 10*3/uL (ref 4.0–10.5)

## 2014-12-05 NOTE — Progress Notes (Signed)
Carelink summary report received. Battery status OK. Normal device function. No new tachy, brady, pause, or AF episodes, +Eliquis. 3 symptom episodes, 1/3 episodes suggests ?SVT, rate 130s-150s, other available ECGs suggest no abnormality. Patient reports feeling "hard heartbeat" during symptom episodes per note from wound check appointment on 10/20/14. Monthly summary reports and ROV with SK on 01/17/15 at 3:00pm.

## 2014-12-05 NOTE — Progress Notes (Signed)
Occupational Therapy Treatment Patient Details Name: Curtis Barton MRN: [id number] DOB: 22-Jul-1955 Today's Date: 12/05/2014    History of present illness 59 y.o. male s/p Rt total hip arthroplasty via posterior approach. Hx of atrial flutter, HTN, and Angina pectoris.   OT comments  Pt making progress toward OT goals; limited by fatigue this session. Educated pt on tub transfer technique with tub bench; pt unsure if bench will fit in his bathroom/tub. Educated pt on toilet transfer technique maintaining hip precautions; pt demonstrated simulated toilet transfer from recliner. Upgraded pts d/c plan to receive HHOT upon return home to maximize safety and independence with ADLs and functional mobility. Will continue to follow pt acutely.    Follow Up Recommendations  Home health OT;Supervision - Intermittent    Equipment Recommendations  None recommended by OT    Recommendations for Other Services      Precautions / Restrictions Precautions Precautions: Posterior Hip;Fall Precaution Comments: reviewed precautions Restrictions Weight Bearing Restrictions: Yes RLE Weight Bearing: Weight bearing as tolerated       Mobility Bed Mobility               General bed mobility comments: Pt sitting EOB with PT, returned to chair with PT  Transfers Overall transfer level: Needs assistance Equipment used: Rolling walker (2 wheeled) Transfers: Sit to/from Stand Sit to Stand: Min guard         General transfer comment: VC for hand placement and technique to follow hip precautions    Balance Overall balance assessment: Needs assistance         Standing balance support: Bilateral upper extremity supported Standing balance-Leahy Scale: Fair Standing balance comment: RW for support                   ADL Overall ADL's : Needs assistance/impaired                                     Functional mobility during ADLs: Min guard;Rolling walker General  ADL Comments: No family present for OT session. Educated pt on tub transfer technique and use of tub bench as safest method with posterior hip precautions; pt unsure if tub bench will fit in tub due to glass doors and toilet being close. Pt concerned about technique and reports he would feel more comfortable taking sponge baths at first. Reviewed toilet transfer technqiue while adhering to hip precautions; pt demonstrated understanding simulated by getting up from recliner. Pt reports that he feels comfortable with using AE for LB ADLs, no questions about AE.       Vision                     Perception     Praxis      Cognition   Behavior During Therapy: WFL for tasks assessed/performed Overall Cognitive Status: Within Functional Limits for tasks assessed                       Extremity/Trunk Assessment               Exercises     Shoulder Instructions       General Comments      Pertinent Vitals/ Pain       Pain Assessment: Faces Faces Pain Scale: Hurts even more Pain Location: R hip Pain Descriptors / Indicators: Grimacing Pain Intervention(s): Limited activity within patient's tolerance;Monitored  during session  Home Living                                          Prior Functioning/Environment              Frequency Min 2X/week     Progress Toward Goals  OT Goals(current goals can now be found in the care plan section)  Progress towards OT goals: Progressing toward goals  Acute Rehab OT Goals Patient Stated Goal: none stated  Plan Discharge plan needs to be updated    Co-evaluation                 End of Session Equipment Utilized During Treatment: Gait belt;Rolling walker   Activity Tolerance Patient limited by fatigue   Patient Left in chair (with PT)   Nurse Communication          Time: 1000-1019 OT Time Calculation (min): 19 min  Charges: OT General Charges $OT Visit: 1 Procedure OT  Treatments $Self Care/Home Management : 8-22 mins  Gaye Alken M.S., OTR/L Pager: [phone]  12/05/2014, 10:28 AM

## 2014-12-05 NOTE — Progress Notes (Signed)
Physical Therapy Treatment Patient Details Name: Curtis Barton MRN: [id number] DOB: [birth year] Today's Date: 12/05/2014    History of Present Illness 59 y.o. male s/p Rt total hip arthroplasty via posterior approach. Hx of atrial flutter, HTN, and Angina pectoris.    PT Comments    Much more confident with mobility this session; Ms. Schrieber was present as well and answered all of her questions; On track for dc home tomorrow   Follow Up Recommendations  Home health PT;Supervision for mobility/OOB     Equipment Recommendations  None recommended by PT    Recommendations for Other Services       Precautions / Restrictions Precautions Precautions: Posterior Hip;Fall Precaution Comments: reviewed precautions Restrictions RLE Weight Bearing: Weight bearing as tolerated    Mobility  Bed Mobility Overal bed mobility: Needs Assistance Bed Mobility: Supine to Sit;Sit to Supine     Supine to sit: Min guard Sit to supine: Min guard   General bed mobility comments: Still slow, but seeming more confident with bed moility this session; recommend Ms. Boughner buy a leg lifter for better ease getting RLE in/out fo the bed  Transfers Overall transfer level: Needs assistance Equipment used: Rolling walker (2 wheeled) Transfers: Sit to/from Stand Sit to Stand: Min guard (from low surface)         General transfer comment: VC for hand placement and technique to follow hip precautions  Ambulation/Gait Ambulation/Gait assistance: Supervision Ambulation Distance (Feet): 120 Feet Assistive device: Rolling walker (2 wheeled) Gait Pattern/deviations: Step-through pattern Gait velocity: decreased   General Gait Details: Overall good gait pattern; cues mostly to self-monitor for activity tolerance as he endorsed being extremely fatigued; cues for no internal rotation with turning to the right   Stairs            Wheelchair Mobility    Modified Rankin (Stroke Patients  Only)       Balance             Standing balance-Leahy Scale: Fair                      Cognition Arousal/Alertness: Awake/alert Behavior During Therapy: WFL for tasks assessed/performed Overall Cognitive Status: Within Functional Limits for tasks assessed                      Exercises Total Joint Exercises Ankle Circles/Pumps: AROM;Both;10 reps;Supine Quad Sets: AROM;Strengthening;Right;10 reps;Supine Gluteal Sets: AROM;Both;10 reps Towel Squeeze: AROM;Both;10 reps (monitored R hip for internal rotation) Heel Slides: AAROM;Strengthening;Right;10 reps;Supine Hip ABduction/ADduction: AAROM;Strengthening;Right;10 reps;Supine    General Comments        Pertinent Vitals/Pain Pain Assessment: Faces Faces Pain Scale: Hurts even more Pain Location: R hip and groin Pain Descriptors / Indicators: Grimacing;Spasm Pain Intervention(s): Limited activity within patient's tolerance;Monitored during session;Patient requesting pain meds-RN notified    Home Living                      Prior Function            PT Goals (current goals can now be found in the care plan section) Acute Rehab PT Goals Patient Stated Goal: wants to be able to manage independently and confidently PT Goal Formulation: With patient Time For Goal Achievement: 12/09/14 Potential to Achieve Goals: Good Progress towards PT goals: Progressing toward goals    Frequency  7X/week    PT Plan Current plan remains appropriate    Co-evaluation  End of Session Equipment Utilized During Treatment: Gait belt Activity Tolerance: Patient tolerated treatment well;Patient limited by fatigue Patient left: in chair;with call bell/phone within reach     Time: 1452-1538 PT Time Calculation (min) (ACUTE ONLY): 46 min  Charges:  $Gait Training: 8-22 mins $Therapeutic Exercise: 8-22 mins $Therapeutic Activity: 8-22 mins                    G Codes:      Olen PelGarrigan,  Cayson Kalb Hamff 12/05/2014, 4:42 PM  Van ClinesHolly Chancelor Hardrick, [place]PT  Acute Rehabilitation Services Pager [phone] Office [phone]

## 2014-12-05 NOTE — Clinical Documentation Improvement (Deleted)
Neurology/Neurosurgery  (please document your query response in the progress notes and discharge summary, not on the query form itself.)  The patient has an order for the following medication(s):  Medrol Dose Pack ordered on 12/02/14 at 9:38 AM by Dr. Conchita ParisNundkumar.  Please document a corresponding diagnosis that supports the use of this medication in the progress notes and discharge summary.   Please exercise your independent, professional judgment when responding. A specific answer is not anticipated or expected.   Thank You,  Jerral Ralphathy R Khya Halls  RN BSN CCDS [phone] Health Information Management [place]

## 2014-12-05 NOTE — Progress Notes (Signed)
Dressing change done ([facility]

## 2014-12-05 NOTE — Progress Notes (Signed)
Physical Therapy Treatment Patient Details Name: Curtis Barton MRN: [id number] DOB: [birth year] Today's Date: 12/05/2014    History of Present Illness 59 y.o. male s/p Rt total hip arthroplasty via posterior approach. Hx of atrial flutter, HTN, and Angina pectoris.    PT Comments    Curtis Barton is showing progress with all aspects of mobility, especially activity tolerance; Noting improvements with practice, and Curtis Barton, [place] and I discussed the possibility of another day and therefore more opportunities to practice bed mobility, transfers, stairs prior to dc   Follow Up Recommendations  Home health PT;Supervision for mobility/OOB     Equipment Recommendations  None recommended by PT    Recommendations for Other Services       Precautions / Restrictions Precautions Precautions: Posterior Hip;Fall Precaution Comments: reviewed precautions Restrictions Weight Bearing Restrictions: Yes RLE Weight Bearing: Weight bearing as tolerated    Mobility  Bed Mobility Overal bed mobility: Needs Assistance Bed Mobility: Supine to Sit;Sit to Supine     Supine to sit: Min guard Sit to supine: Min guard   General bed mobility comments: Performed multiple reps (3) of supine <>sit for the sheer practice of it; Simulated height of his bed at home and used a step stool -- good practice and problem-solving, but ultimately Curtis Barton did not like using it; Excellent use of belt to assist his RLE onto bed during the last rep of getting in; slow moving and somewhat inefficient, but improving with practice; close monitor to avoid R hip interanl roataion during transitions  Transfers Overall transfer level: Needs assistance Equipment used: Rolling walker (2 wheeled) Transfers: Sit to/from Stand Sit to Stand: Min guard (from low surface)         General transfer comment: VC for hand placement and technique to follow hip precautions  Ambulation/Gait Ambulation/Gait assistance:  Supervision Ambulation Distance (Feet): 70 Feet Assistive device: Rolling walker (2 wheeled) Gait Pattern/deviations: Step-to pattern     General Gait Details: Overall good gait pattern; cues mostly to self-monitor for activity tolerance as he endorsed being extremely fatigued; cues for no internal rotation with turning to the right   Stairs            Wheelchair Mobility    Modified Rankin (Stroke Patients Only)       Balance Overall balance assessment: Needs assistance         Standing balance support: Bilateral upper extremity supported Standing balance-Leahy Scale: Fair Standing balance comment: RW for support                    Cognition Arousal/Alertness: Awake/alert Behavior During Therapy: WFL for tasks assessed/performed Overall Cognitive Status: Within Functional Limits for tasks assessed                      Exercises      General Comments General comments (skin integrity, edema, etc.): Lengthy conversation re: dc planning, and PT's goal that pt will be confident managing at home; We discussed car transfers as well      Pertinent Vitals/Pain Pain Assessment: Faces Faces Pain Scale: Hurts even more Pain Location: R hip and groin Pain Descriptors / Indicators: Grimacing Pain Intervention(s): Limited activity within patient's tolerance;Monitored during session;Premedicated before session;Repositioned    Home Living                      Prior Function            PT Goals (current  goals can now be found in the care plan section) Acute Rehab PT Goals Patient Stated Goal: wants to be able to manage independently and confidently PT Goal Formulation: With patient Time For Goal Achievement: 12/09/14 Potential to Achieve Goals: Good Progress towards PT goals: Progressing toward goals    Frequency  7X/week    PT Plan Current plan remains appropriate    Co-evaluation             End of Session Equipment Utilized  During Treatment: Gait belt Activity Tolerance: Patient tolerated treatment well;Patient limited by fatigue Patient left: in chair;with call bell/phone within reach     Time: 0935-1035 (Taking out approx 10 minutes for OT in gym) PT Time Calculation (min) (ACUTE ONLY): 60 min  Charges:  $Gait Training: 8-22 mins $Therapeutic Activity: 23-37 mins                    G Codes:      Olen Pel 12/05/2014, 12:09 PM  Van Clines, PT  Acute Rehabilitation Services Pager [phone] Office [phone]

## 2014-12-05 NOTE — Discharge Summary (Signed)
PATIENT ID: Curtis Barton        MRN:  [id number]          DOB/AGE: 04/30/1955 / 59 y.o.    DISCHARGE SUMMARY  ADMISSION DATE:    12/02/2014 DISCHARGE DATE:   12/06/2014  ADMISSION DIAGNOSIS: OA RIGHT HIP    DISCHARGE DIAGNOSIS:  OA RIGHT HIP    ADDITIONAL DIAGNOSIS: Active Problems:   Primary localized osteoarthritis of right hip  Past Medical History  Diagnosis Date  . Hypertension   . Arthritis   . TIA (transient ischemic attack) 2010  . Atrial flutter (HCC)     a. s/p ablation  . Primary localized osteoarthritis of right knee   . Asthma     as child  . Cancer (HCC)   . Fuchs' uveitis syndrome     left eye  . Difficult intubation     maybe 8 years years ago, no trouble since then  . Dysrhythmia   . Heart murmur   . Stroke (HCC)   . Sleep apnea     home sleep study shows some sleep apnea  . Kidney stone     PROCEDURE: Procedure(s): TOTAL HIP ARTHROPLASTY Right on 12/02/2014  CONSULTS: PT/OT      HISTORY:  See H&P in chart  HOSPITAL COURSE:  BRAYLON GRENDA is a 59 y.o. admitted on 12/02/2014 and found to have a diagnosis of OA RIGHT HIP.  After appropriate laboratory studies were obtained  they were taken to the operating room on 12/02/2014 and underwent  Procedure(s): TOTAL HIP ARTHROPLASTY  Right.   They were given perioperative antibiotics:      Anti-infectives    Start     Dose/Rate Route Frequency Ordered Stop   12/02/14 1400  ceFAZolin (ANCEF) IVPB 2 g/50 mL premix     2 g 100 mL/hr over 30 Minutes Intravenous Every 6 hours 12/02/14 1112 12/02/14 2332   12/02/14 0600  ceFAZolin (ANCEF) 3 g in dextrose 5 % 50 mL IVPB     3 g 160 mL/hr over 30 Minutes Intravenous On call to O.R. 12/01/14 1333 12/02/14 0753   12/02/14 0544  ceFAZolin (ANCEF) IVPB 2 g/50 mL premix  Status:  Discontinued     2 g 100 mL/hr over 30 Minutes Intravenous On call to O.R. 12/02/14 4098 12/02/14 1220    .  Tolerated the procedure well.    POD #1, allowed out of  bed to a chair.  PT for ambulation and exercise program.  IV saline locked.  O2 discontionued.    POD #2, continued PT and ambulation.    The remainder of the hospital course was dedicated to ambulation and strengthening.   The patient was discharged on 4 days post op in  Stable condition.  Blood products given:none  DIAGNOSTIC STUDIES: Recent vital signs:  Patient Vitals for the past 24 hrs:  BP Temp Temp src Pulse Resp SpO2  12/06/14 0500 (!) 124/58 mmHg 98 F (36.7 C) Oral 80 19 98 %  12/05/14 2118 (!) 114/95 mmHg 97.9 F (36.6 C) Oral 72 18 97 %  12/05/14 1229 100/60 mmHg 98.4 F (36.9 C) - 70 18 94 %  12/05/14 0907 (!) 101/57 mmHg - - 69 - -       Recent laboratory studies:  Recent Labs  12/03/14 0400 12/04/14 0513 12/05/14 0324  WBC 10.9* 9.3 9.5  HGB 11.5* 10.3* 10.1*  HCT 34.1* 31.7* 30.5*  PLT 183 157 162    Recent  Labs  12/03/14 0400  NA 138  K 4.5  CL 103  CO2 26  BUN 11  CREATININE 1.06  GLUCOSE 123*  CALCIUM 8.9   Lab Results  Component Value Date   INR 1.13 12/02/2014   INR 1.27 11/21/2014   INR 1.15 11/17/2013     Recent Radiographic Studies :  Dg Chest 2 View  11/17/2014  CLINICAL DATA:  Preoperative exam prior to hip replacement, history of cardiac dysrhythmia, previous history of tobacco use. EXAM: CHEST  2 VIEW COMPARISON:  PA and lateral chest x-ray of November 17, 2013 FINDINGS: The lungs are adequately inflated. There is no focal infiltrate. There is no pleural effusion. The interstitial markings are mildly increased but stable. There is stable apical pleural thickening bilaterally. The heart is normal in size. The mediastinum is normal in width. There is no pulmonary vascular congestion. The implantable cardiac monitor is in reasonable position in the left parasternal soft tissues. IMPRESSION: There is no active cardiopulmonary disease. Electronically Signed   By: David  [place] M.D.   On: 11/17/2014 09:34   Ct Abdomen Pelvis W  Contrast  11/17/2014  CLINICAL DATA:  Right-sided abdominal pain ; abnormal bowel sounds EXAM: CT ABDOMEN AND PELVIS WITH CONTRAST TECHNIQUE: Multidetector CT imaging of the abdomen and pelvis was performed using the standard protocol following bolus administration of intravenous contrast. CONTRAST:  125 mL Isovue-300 nonionic COMPARISON:  None. FINDINGS: Lower chest: There is mild right base atelectasis. Lung bases otherwise are clear. Hepatobiliary: No focal liver lesions are identified. There is no appreciable gallbladder wall thickening. There is no biliary duct dilatation. Pancreas: No mass or inflammatory focus. Spleen: There is a small calcified granuloma in the medial spleen. Spleen otherwise appears unremarkable. A tiny splenule is noted inferior to the spleen. Adrenals/Urinary Tract: Adrenals appear normal bilaterally. There is extensive scarring in the right kidney. There is a cyst arising from the upper pole of the right kidney posteriorly measuring 3.1 x 3.1 cm. There is an extrarenal pelvis on the right. There are surgical clips at the level of the right ureteropelvic junction. There is no demonstrable renal or ureteral calculus on the right. On the left, there is a degree of compensatory hypertrophy of the kidney. There is no left renal mass or hydronephrosis. There is no left-sided renal or ureteral calculus. There is an extrarenal pelvis on the left, anatomic variant. The urinary bladder is midline with normal wall thickness. Stomach/Bowel: There is no bowel wall or mesenteric thickening. No bowel obstruction. No free air or portal venous air. There are occasional colonic diverticula without diverticulitis. Vascular/Lymphatic: There is atherosclerotic change in the abdominal aorta and iliac arteries without aneurysm. Mild peripheral thrombus is noted in the mid to distal aorta. There is no periaortic fluid. There is no appreciable adenopathy in the abdomen or pelvis. Reproductive: Prostate is  normal in size and contour. There is no pelvic mass or pelvic fluid collection. Other: Appendix appears normal. No abscess or ascites in the abdomen or pelvis. There is a minimal ventral hernia containing only fat. Musculoskeletal: No intramuscular or abdominal wall lesion. There is degenerative change in the lumbar spine with vacuum phenomenon at L5-S1. There are no blastic or lytic bone lesions. IMPRESSION: Scarring right kidney. Dilatation of an extrarenal pelvis is noted. There are surgical clips at the right ureteropelvic junction. Question a degree of chronic ureteropelvic junction obstruction. No intrarenal or ureteral calculi are noted on either side. No bowel obstruction. No abscess. Appendix appears normal. Minimal  ventral hernia containing only fat. Electronically Signed   By: Bretta BangWilliam  Woodruff III M.D.   On: 11/17/2014 10:09   Dg Pelvis Portable  12/02/2014  CLINICAL DATA:  Status post right total hip arthroplasty EXAM: PORTABLE PELVIS 1-2 VIEWS COMPARISON:  None. FINDINGS: Well seated femoral and acetabular components of a total right hip arthroplasty. No complicating features are demonstrated. IMPRESSION: Well seated components of a total right hip arthroplasty. Electronically Signed   By: Rudie MeyerP.  Gallerani M.D.   On: 12/02/2014 11:54    DISCHARGE INSTRUCTIONS:   DISCHARGE MEDICATIONS:     Medication List    STOP taking these medications        tapentadol 50 MG Tabs tablet  Commonly known as:  NUCYNTA      TAKE these medications        ACETAMINOPHEN 8 HOUR 650 MG CR tablet  Generic drug:  acetaminophen  Take 1,300 mg by mouth as needed for pain.     apixaban 5 MG Tabs tablet  Commonly known as:  ELIQUIS  Take 1 tablet (5 mg total) by mouth 2 (two) times daily.     atenolol 25 MG tablet  Commonly known as:  TENORMIN  Take one tablet by mouth twice daily as directed     cyclobenzaprine 10 MG tablet  Commonly known as:  FLEXERIL  Take 10 mg by mouth 3 (three) times daily as  needed for muscle spasms.     HYDROmorphone 2 MG tablet  Commonly known as:  DILAUDID  1-2 tabs po q4-6hrs prn pain        FOLLOW UP VISIT:   Follow-up Information    Follow up with CAFFREY JR,W D, MD. Schedule an appointment as soon as possible for a visit in 2 weeks.   Specialty:  Orthopedic Surgery   Contact information:   [address] [place] [address] [phone]       [facility]   Why:  Home health physical therapy; above agency will call you within 24 to 48 hours of discharge to arrange visit.    Contact information:   [address] [place] [address] [phone]       DISPOSITION:   Home  CONDITION:  Stable  Margart SicklesJoshua Willella Harding, PA-C  12/06/2014 8:41 AM

## 2014-12-06 ENCOUNTER — Encounter (HOSPITAL_COMMUNITY): Payer: Self-pay | Admitting: General Practice

## 2014-12-06 NOTE — Progress Notes (Signed)
Subjective: 4 Days Post-Op Procedure(s) (LRB): TOTAL HIP ARTHROPLASTY (Right) Patient reports pain as mild and moderate.    Objective: Vital signs in last 24 hours: Temp:  [97.9 F (36.6 C)-98.4 F (36.9 C)] 98 F (36.7 C) (10/25 0500) Pulse Rate:  [69-80] 80 (10/25 0500) Resp:  [18-19] 19 (10/25 0500) BP: (100-124)/(57-95) 124/58 mmHg (10/25 0500) SpO2:  [94 %-98 %] 98 % (10/25 0500)  Intake/Output from previous day: 10/24 0701 - 10/25 0700 In: 1080 [P.O.:1080] Out: -  Intake/Output this shift:     Recent Labs  12/04/14 0513 12/05/14 0324  HGB 10.3* 10.1*    Recent Labs  12/04/14 0513 12/05/14 0324  WBC 9.3 9.5  RBC 3.23* 3.13*  HCT 31.7* 30.5*  PLT 157 162   No results for input(s): NA, K, CL, CO2, BUN, CREATININE, GLUCOSE, CALCIUM in the last 72 hours. No results for input(s): LABPT, INR in the last 72 hours.  Neurovascular intact Sensation intact distally Intact pulses distally Dorsiflexion/Plantar flexion intact Incision: scant drainage No cellulitis present Compartment soft  Assessment/Plan: 4 Days Post-Op Procedure(s) (LRB): TOTAL HIP ARTHROPLASTY (Right) Up with therapy Discharge home with home health  Margart SicklesChadwell, Tarea Skillman 12/06/2014, 8:38 AM

## 2014-12-06 NOTE — Progress Notes (Signed)
Physical Therapy Treatment Patient Details Name: Curtis EagleShannon H Atienza MRN: [id number] DOB: May 22, 1955 Today's Date: 12/06/2014    History of Present Illness 59 y.o. male s/p Rt total hip arthroplasty via posterior approach. Hx of atrial flutter, HTN, and Angina pectoris.    PT Comments    Much more confident in his ability to manage; Ambulated household distance and practiced Bed mobility again; OK for dc home from PT standpoint   Follow Up Recommendations  Home health PT;Supervision for mobility/OOB     Equipment Recommendations  None recommended by PT    Recommendations for Other Services       Precautions / Restrictions Precautions Precautions: Posterior Hip Precaution Comments: reviewed precautions Restrictions Weight Bearing Restrictions: Yes RLE Weight Bearing: Weight bearing as tolerated    Mobility  Bed Mobility Overal bed mobility: Needs Assistance Bed Mobility: Sit to Supine       Sit to supine: Min guard   General bed mobility comments: Very nice use of leg lifter to assist RLE onto bed; Cues to be vigilant about no internal rotation and no crossing legs  Transfers Overall transfer level: Needs assistance Equipment used: Rolling walker (2 wheeled) Transfers: Sit to/from Stand Sit to Stand: Supervision         General transfer comment: VC for hand placement and technique to follow hip precautions  Ambulation/Gait Ambulation/Gait assistance: Supervision Ambulation Distance (Feet): 100 Feet Assistive device: Rolling walker (2 wheeled) Gait Pattern/deviations: Step-through pattern     General Gait Details: Overall good gait pattern; cues mostly to self-monitor for activity tolerance as he endorsed being extremely fatigued; cues for no internal rotation with turning to the right   Stairs            Wheelchair Mobility    Modified Rankin (Stroke Patients Only)       Balance                                    Cognition  Arousal/Alertness: Awake/alert Behavior During Therapy: WFL for tasks assessed/performed Overall Cognitive Status: Within Functional Limits for tasks assessed                      Exercises      General Comments General comments (skin integrity, edema, etc.): RN changed dressing and gave pain meds at end of session      Pertinent Vitals/Pain Pain Assessment: 0-10 Pain Score: 5  Pain Location: R hip and groin Pain Descriptors / Indicators: Aching Pain Intervention(s): Monitored during session;RN gave pain meds during session    Home Living                      Prior Function            PT Goals (current goals can now be found in the care plan section) Acute Rehab PT Goals Patient Stated Goal: wants to be able to manage independently and confidently PT Goal Formulation: With patient Time For Goal Achievement: 12/09/14 Potential to Achieve Goals: Good Progress towards PT goals: Progressing toward goals    Frequency  7X/week    PT Plan Current plan remains appropriate    Co-evaluation             End of Session   Activity Tolerance: Patient tolerated treatment well Patient left: in chair;with call bell/phone within reach     Time: [id number] PT Time Calculation (  min) (ACUTE ONLY): 32 min  Charges:  $Gait Training: 8-22 mins $Therapeutic Activity: 8-22 mins                    G Codes:      Olen Pel 12/06/2014, 9:16 AM  Van Clines, PT  Acute Rehabilitation Services Pager [phone] Office [phone]

## 2014-12-06 NOTE — Progress Notes (Signed)
Patient ID: Curtis Barton, male   DOB: November 21, 1955, 59 y.o.   MRN: [id number]  Patient still with concerns with stairs and mobility in and out of bed recommend work with PT today plan on d/c home with hhpt tomorrow.

## 2014-12-12 ENCOUNTER — Ambulatory Visit (INDEPENDENT_AMBULATORY_CARE_PROVIDER_SITE_OTHER): Payer: 59 | Admitting: *Deleted

## 2014-12-12 DIAGNOSIS — R55 Syncope and collapse: Secondary | ICD-10-CM | POA: Diagnosis not present

## 2014-12-13 NOTE — Progress Notes (Signed)
Loop recorder 

## 2014-12-22 ENCOUNTER — Encounter: Payer: Self-pay | Admitting: Cardiology

## 2015-01-07 LAB — CUP PACEART REMOTE DEVICE CHECK: Date Time Interrogation Session: 20161030203514

## 2015-01-07 NOTE — Progress Notes (Signed)
Carelink summary report received. Battery status OK. Normal device function. No tachy, brady, pause, or AF episodes. 1 symptom episode--ECG/plot suggest PACs/PVCs and artifact. Monthly summary reports and ROV with SK on 01/17/15 at 3:00pm.

## 2015-01-09 NOTE — Telephone Encounter (Signed)
Insurance approval was given for Titration. Called patient to discuss scheduling - he stated that he did not want anything scheduled until he sees Dr. Graciela HusbandsKlein on 12/6.  I am routing this message to Dr. Odessa FlemingKlein's nurse so she is aware.   Also requesting that she let me know if he makes a decision during that office visit  That he is ready to proceed.   Once I have approval from patient, I will schedule the titration.

## 2015-01-10 ENCOUNTER — Ambulatory Visit (INDEPENDENT_AMBULATORY_CARE_PROVIDER_SITE_OTHER): Payer: 59 | Admitting: *Deleted

## 2015-01-10 DIAGNOSIS — R55 Syncope and collapse: Secondary | ICD-10-CM

## 2015-01-11 NOTE — Progress Notes (Signed)
Carelink Summary Report / Loop Recorder 

## 2015-01-17 ENCOUNTER — Ambulatory Visit (INDEPENDENT_AMBULATORY_CARE_PROVIDER_SITE_OTHER): Payer: 59 | Admitting: Internal Medicine

## 2015-01-17 ENCOUNTER — Encounter: Payer: Self-pay | Admitting: Internal Medicine

## 2015-01-17 VITALS — BP 132/88 | HR 61 | Ht 75.0 in | Wt 285.0 lb

## 2015-01-17 DIAGNOSIS — R0602 Shortness of breath: Secondary | ICD-10-CM | POA: Diagnosis not present

## 2015-01-17 DIAGNOSIS — R079 Chest pain, unspecified: Secondary | ICD-10-CM | POA: Diagnosis not present

## 2015-01-17 NOTE — Patient Instructions (Signed)
Medication Instructions: - no changes   Labwork: - none  Procedures/Testing: - Your physician has requested that you have a lexiscan myoview. For further information please visit [email or web address]. Please follow instruction sheet, as given.  - Your physician has requested that you have an echocardiogram. Echocardiography is a painless test that uses sound waves to create images of your heart. It provides your doctor with information about the size and shape of your heart and how well your heart's chambers and valves are working. This procedure takes approximately one hour. There are no restrictions for this procedure.  Follow-Up: - Your physician wants you to follow-up in: 6 months with Dr. Graciela HusbandsKlein. You will receive a reminder letter in the mail two months in advance. If you don't receive a letter, please call our office to schedule the follow-up appointment.  Any Additional Special Instructions Will Be Listed Below (If Applicable). - none

## 2015-01-17 NOTE — Telephone Encounter (Signed)
Okey RegalHey Bethany,  The patient saw Dr. Graciela HusbandsKlein today and he is ok with proceeding with titration. He would like to schedule before January as his insurance will be changing.  Thanks! Curtis SetaHeather

## 2015-01-17 NOTE — Progress Notes (Signed)
his hip replacement      Patient Care Team: Kaleen Mask, MD as PCP - General (Family Medicine)   HPI  Curtis Barton is a 59 y.o. male Seen in followup for atrial flutter for which he underwent catheter ablation while in hospital 9/15.  Echocardiogram at that time demonstrated normal left ventricular function.  CHADS-VASc score was 3 with hypertension and presumed TIAs    Hip  replacement surgery 8/16   Over the last couple of months, he has noted significant change in his exercise capacity manifested by dyspnea on exertion and a midsternal-shoulder chest discomfort. These both relieved with rest and/or recurrent with repeated exertion  LHC 4/16 >>50% LAD. He continues to struggle with dyspnea  ILR insertion 8/16 for presyncope and tachypalpitations   Cardiac risk factors include hypertension, vascular disease, smoking;  We dont have lipids .   No smoking now         Past Medical History  Diagnosis Date  . Hypertension   . Arthritis   . TIA (transient ischemic attack) 2010  . Atrial flutter (HCC)     a. s/p ablation  . Primary localized osteoarthritis of right knee   . Asthma     as child  . Cancer (HCC)   . Fuchs' uveitis syndrome     left eye  . Difficult intubation     maybe 8 years years ago, no trouble since then  . Dysrhythmia   . Heart murmur   . Stroke (HCC)   . Sleep apnea     home sleep study shows some sleep apnea  . Kidney stone     Past Surgical History  Procedure Laterality Date  . Hernia repair    . Tonsillectomy    . Knee cartilage surgery Bilateral   . Acle repair Right 2000    ACL repair  . Cystoscopy w/ ureteral stent placement  1999  . Rotator cuff repair Right 2005  . Atrial ablation surgery  10/2013    Dr Graciela Husbands  . Eye surgery    . Cataract extraction w/ intraocular lens  implant, bilateral    . Kidney surgery    . Total knee arthroplasty Right 11/26/2013    dr caffrey  . Total knee arthroplasty Right 11/26/2013   Procedure: RIGHT TOTAL KNEE ARTHROPLASTY;  Surgeon: Thera Flake., MD;  Location: [facility] OR;  Service: Orthopedics;  Laterality: Right;  . Atrial flutter ablation N/A 10/13/2013    Procedure: ATRIAL FLUTTER ABLATION;  Surgeon: Duke Salvia, MD;  Location: [facility] CATH LAB;  Service: Cardiovascular;  Laterality: N/A;  . Left heart catheterization with coronary angiogram N/A 05/17/2014    Procedure: LEFT HEART CATHETERIZATION WITH CORONARY ANGIOGRAM;  Surgeon: Corky Crafts, MD;  Location: [facility] CATH LAB;  Service: Cardiovascular;  Laterality: N/A;  . Ep implantable device N/A 10/12/2014    Procedure: Loop Recorder Insertion;  Surgeon: Duke Salvia, MD;  Location: [facility] INVASIVE CV LAB;  Service: Cardiovascular;  Laterality: N/A;  . Total hip arthroplasty Right 12/02/2014    Procedure: TOTAL HIP ARTHROPLASTY;  Surgeon: Frederico Hamman, MD;  Location: [facility] OR;  Service: Orthopedics;  Laterality: Right;    Current Outpatient Prescriptions  Medication Sig Dispense Refill  . acetaminophen (ACETAMINOPHEN 8 HOUR) 650 MG CR tablet Take 1,300 mg by mouth as needed for pain.    Marland Kitchen apixaban (ELIQUIS) 5 MG TABS tablet Take 1 tablet (5 mg total) by mouth 2 (two) times daily. 180 tablet 3  .  atenolol (TENORMIN) 25 MG tablet Take one tablet by mouth twice daily as directed (Patient taking differently: Take 25 mg by mouth 2 (two) times daily. ) 60 tablet 6  . cyclobenzaprine (FLEXERIL) 10 MG tablet Take 10 mg by mouth 3 (three) times daily as needed for muscle spasms.    Marland Kitchen. HYDROmorphone (DILAUDID) 2 MG tablet 1-2 tabs po q4-6hrs prn pain 100 tablet 0   No current facility-administered medications for this visit.    Allergies  Allergen Reactions  . Nucynta [Tapentadol] Itching  . Codeine Itching  . Hydrocodone Itching  . Oxycodone Itching    Review of Systems negative except from HPI and PMH  Physical Exam BP 132/88 mmHg  Pulse 61  Ht 6\' 3"  (1.905 m)  Wt 285 lb (129.275 kg)  BMI 35.62 kg/m2  SpO2 96% Well  developed and well nourished in no acute distress HENT normal E scleral and icterus clear Neck Supple JVP flat; carotids brisk and full Clear to ausculation  Regular rate and rhythm, no murmurs gallops or rub Soft with active bowel sounds No clubbing cyanosis  Edema Alert and oriented, grossly normal motor and sensory function Skin Warm and Dry   ECG was ordered today demonstrating sinus rhythm at 57 Intervals 17/09/41 Otherwise normal Assessment and  Plan  Atrial flutter status post ablation   Chest pain /Dyspnea  Hypertension  Sleep disordered breathing   Atrial tachycardia-nonsustained  TIA--MRI neg   Dyslipidemia    Recurrent chest pain  not withstanding the fact the catheterization was negative in April, this discomfort is different and associated with dyspnea so will undertake a Myoview scan with high threshold for pursuing a diagnosis of coronary disease.    I  wonder whether  following hip replacement he is not more likely to provoke ischemia   also unlikely statistically that he's had a pulmonary embolism following hip replacement surgery given the fact that he has been on apixaban. Statistically, however, the results are about 1.0+ or minus percent for pulmonary embolism on therapy. We will have to keep this in mind although he is already treated.  We will also undertake an echocardiogram to see if we can get any clues here as to why he is dyspneic. He is mildly volume overloaded but not sufficient to explain it and as there is no orthopnea or nocturnal dyspnea is hard to blame it on left heart disease

## 2015-01-18 NOTE — Addendum Note (Signed)
Addended by: Arcola JanskyOOK, BETHANY M on: 01/18/2015 08:55 AM   Modules accepted: Orders

## 2015-01-18 NOTE — Telephone Encounter (Signed)
Discussed with patient.   We are adding him to the schedule for December 16 8PM -   He has been given the time/location for this appointment.

## 2015-01-19 ENCOUNTER — Encounter: Payer: Self-pay | Admitting: *Deleted

## 2015-01-19 ENCOUNTER — Telehealth: Payer: Self-pay | Admitting: Internal Medicine

## 2015-01-19 NOTE — Telephone Encounter (Signed)
Letter faxed to Dr. Kreg Shropshireaffrey/ Kara at [phone]. Confirmation received.

## 2015-01-19 NOTE — Telephone Encounter (Signed)
Called Diannia RuderKara to clarify. She is requesting is the patient can have a MRI with his device. The patient has a LINQ- discussed with Dr. Graciela HusbandsKlein- this is ok. I made UkraineKara aware I will fax this to her.

## 2015-01-19 NOTE — Telephone Encounter (Signed)
Request for surgical clearance:  1. What type of surgery is being performed? MRI  2. When is this surgery scheduled? 01/26/15  3. Are there any medications that need to be held prior to surgery and how long? UP TO DOCTOR  4. Name of physician performing surgery? Dr.Caffrey  5. What is your office phone and fax number? [phone]        Fax [phone]

## 2015-01-26 ENCOUNTER — Other Ambulatory Visit: Payer: Self-pay | Admitting: Orthopedic Surgery

## 2015-01-26 DIAGNOSIS — M25522 Pain in left elbow: Secondary | ICD-10-CM

## 2015-01-27 ENCOUNTER — Ambulatory Visit (HOSPITAL_BASED_OUTPATIENT_CLINIC_OR_DEPARTMENT_OTHER): Payer: 59 | Attending: Internal Medicine

## 2015-01-27 VITALS — Ht 76.0 in | Wt 275.0 lb

## 2015-01-27 DIAGNOSIS — Z79899 Other long term (current) drug therapy: Secondary | ICD-10-CM | POA: Diagnosis not present

## 2015-01-27 DIAGNOSIS — G4733 Obstructive sleep apnea (adult) (pediatric): Secondary | ICD-10-CM

## 2015-01-27 DIAGNOSIS — Z7901 Long term (current) use of anticoagulants: Secondary | ICD-10-CM | POA: Diagnosis not present

## 2015-01-27 DIAGNOSIS — G4719 Other hypersomnia: Secondary | ICD-10-CM

## 2015-01-27 DIAGNOSIS — G4761 Periodic limb movement disorder: Secondary | ICD-10-CM | POA: Diagnosis not present

## 2015-01-30 ENCOUNTER — Telehealth (HOSPITAL_COMMUNITY): Payer: Self-pay | Admitting: *Deleted

## 2015-01-30 NOTE — Telephone Encounter (Signed)
Patient given detailed instructions per Myocardial Perfusion Study Information Sheet for the test on 02/02/15 at 945. Patient notified to arrive 15 minutes early and that it is imperative to arrive on time for appointment to keep from having the test rescheduled.  If you need to cancel or reschedule your appointment, please call the office within 24 hours of your appointment. Failure to do so may result in a cancellation of your appointment, and a $50 no show fee. Patient verbalized understanding.Antionette CharMary J Yael Angerer, RN

## 2015-01-31 LAB — CUP PACEART INCLINIC DEVICE CHECK: Date Time Interrogation Session: 20161220090815

## 2015-02-02 ENCOUNTER — Ambulatory Visit (HOSPITAL_BASED_OUTPATIENT_CLINIC_OR_DEPARTMENT_OTHER): Payer: 59

## 2015-02-02 ENCOUNTER — Other Ambulatory Visit (HOSPITAL_COMMUNITY): Payer: 59 | Admitting: *Deleted

## 2015-02-02 ENCOUNTER — Other Ambulatory Visit: Payer: Self-pay

## 2015-02-02 ENCOUNTER — Ambulatory Visit (HOSPITAL_COMMUNITY): Payer: 59 | Attending: Cardiovascular Disease

## 2015-02-02 DIAGNOSIS — I10 Essential (primary) hypertension: Secondary | ICD-10-CM | POA: Diagnosis not present

## 2015-02-02 DIAGNOSIS — R0602 Shortness of breath: Secondary | ICD-10-CM

## 2015-02-02 DIAGNOSIS — R079 Chest pain, unspecified: Secondary | ICD-10-CM | POA: Diagnosis not present

## 2015-02-02 DIAGNOSIS — R0609 Other forms of dyspnea: Secondary | ICD-10-CM | POA: Insufficient documentation

## 2015-02-02 DIAGNOSIS — I517 Cardiomegaly: Secondary | ICD-10-CM | POA: Insufficient documentation

## 2015-02-02 DIAGNOSIS — R0600 Dyspnea, unspecified: Secondary | ICD-10-CM | POA: Diagnosis present

## 2015-02-02 LAB — MYOCARDIAL PERFUSION IMAGING
CHL CUP NUCLEAR SDS: 0
CHL CUP RESTING HR STRESS: 57 {beats}/min
LV dias vol: 101 mL
LV sys vol: 36 mL
Peak HR: 76 {beats}/min
RATE: 0.3
SRS: 9
SSS: 9
TID: 1.06

## 2015-02-02 MED ORDER — REGADENOSON 0.4 MG/5ML IV SOLN
0.4000 mg | Freq: Once | INTRAVENOUS | Status: AC
  Administered 2015-02-02: 0.4 mg via INTRAVENOUS

## 2015-02-02 MED ORDER — TECHNETIUM TC 99M SESTAMIBI GENERIC - CARDIOLITE
32.6000 | Freq: Once | INTRAVENOUS | Status: AC | PRN
  Administered 2015-02-02: 33 via INTRAVENOUS

## 2015-02-02 MED ORDER — PERFLUTREN LIPID MICROSPHERE
1.0000 mL | INTRAVENOUS | Status: AC | PRN
  Administered 2015-02-02: 2 mL via INTRAVENOUS

## 2015-02-02 MED ORDER — TECHNETIUM TC 99M SESTAMIBI GENERIC - CARDIOLITE
10.2000 | Freq: Once | INTRAVENOUS | Status: AC | PRN
  Administered 2015-02-02: 10 via INTRAVENOUS

## 2015-02-03 ENCOUNTER — Encounter: Payer: Self-pay | Admitting: Internal Medicine

## 2015-02-03 ENCOUNTER — Ambulatory Visit
Admission: RE | Admit: 2015-02-03 | Discharge: 2015-02-03 | Disposition: A | Discharge status: Home / Self Care | Payer: 59 | Source: Ambulatory Visit | Attending: Orthopedic Surgery | Admitting: Orthopedic Surgery

## 2015-02-03 DIAGNOSIS — M25522 Pain in left elbow: Secondary | ICD-10-CM

## 2015-02-03 NOTE — Progress Notes (Signed)
Surgical Clearance form for Curtis Barton [facility] faxed to [phone]. Confirmation received.

## 2015-02-06 NOTE — Sleep Study (Signed)
Patient Name: Curtis Barton, Curtis Barton Date: 01/27/2015 Gender: Male D.O.B: [birth year] Age (years): 59 Referring Provider: Sherryl Manges Height (inches): 74 Interpreting Physician: Nicki Guadalajara MD, ABSM Weight (lbs): 275 RPSGT: Cherylann Parr BMI: 35 MRN: [id number] Neck Size: 19.00  CLINICAL INFORMATION The patient is referred for a CPAP titration to treat sleep apnea. Date of NPSG, Split Night or HST: Home sleep study demonstrated mild OSA  SLEEP STUDY TECHNIQUE As per the AASM Manual for the Scoring of Sleep and Associated Events v2.3 (April 2016) with a hypopnea requiring 4% desaturations. The channels recorded and monitored were frontal, central and occipital EEG, electrooculogram (EOG), submentalis EMG (chin), nasal and oral airflow, thoracic and abdominal wall motion, anterior tibialis EMG, snore microphone, electrocardiogram, and pulse oximetry. Continuous positive airway pressure (CPAP) was initiated at the beginning of the study and titrated to treat sleep-disordered breathing.  MEDICATIONS  acetaminophen (ACETAMINOPHEN 8 HOUR) 650 MG CR tablet 1,300 mg, As needed     apixaban (ELIQUIS) 5 MG TABS tablet 5 mg, 2 times daily     atenolol (TENORMIN) 25 MG tablet      Patient taking differently: 25 mg Oral 2 times daily, (No instructions reported), Reason: Free Text Sig Edit,  Informant: Self, Reported on 11/17/2014   cyclobenzaprine (FLEXERIL) 10 MG tablet 10 mg, 3 times daily PRN     HYDROmorphone (DILAUDID) 2 MG tablet   Medications administered by patient during sleep study : No sleep medicine administered.  TECHNICIAN COMMENTS Comments added by technician: Patient talked in his/her sleep.  Comments added by scorer: N/A  RESPIRATORY PARAMETERS Optimal PAP Pressure (cm): 9 AHI at Optimal Pressure (/hr): 0.0 Overall Minimal O2 (%): 75.00 Supine % at Optimal Pressure (%): 0 Minimal O2 at Optimal Pressure (%): 93.0    SLEEP ARCHITECTURE The study was initiated at  10:12:04 PM and ended at 5:17:22 AM. Sleep onset time was 10.6 minutes and the sleep efficiency was 81.3%. The total sleep time was 345.7 minutes. Wake after sleep onset (WASO) was 69 minutes The patient spent 3.76% of the night in stage N1 sleep, 67.31% in stage N2 sleep, 3.18% in stage N3 and 25.74% in REM.Stage REM latency was 71.5 minutes Wake after sleep onset was 69.0. Alpha intrusion was absent. Supine sleep was 24.15%.  CARDIAC DATA The 2 lead EKG demonstrated sinus rhythm. The mean heart rate was 68.28 beats per minute. Other EKG findings include: None.  LEG MOVEMENT DATA The total Periodic Limb Movements of Sleep (PLMS) were 531. The PLMS index was 92.16. A PLMS index of <15 is considered normal in adults.  IMPRESSIONS - The patient was titrated to a CPAP pressure of 9 with an AHI of 0/h at 9 cm water pressure. - Central sleep apnea was not noted during this titration (CAI = 0.2/h). - Severe oxygen desaturation to a nadir of 75% during REM sleep at 5 cm pressure. The minimum SpO2% at 9 cm was 93%. - No snoring was audible during this study. - No cardiac abnormalities were observed during this study. - Severe periodic limb movements were observed during this study. Arousals associated with PLMs were rare.  DIAGNOSIS - Obstructive Sleep Apnea (327.23 [G47.33 ICD-10]) - Periodic Limb Movement Disorder  RECOMMENDATIONS - Recommend an initial trial of CPAP therapy at 9 cm water pressure with heated humidification. A medium size F&P nasal mask was used for the study. - Avoid alcohol, sedatives and other CNS depressants that may worsen sleep apnea and disrupt normal sleep architecture. - If  patient is symptomatic with restless legs on CPAP therapy consider a trial of medical therapy with a PLMS index of 92.16. - Sleep hygiene should be reviewed to assess factors that may improve sleep quality. - Weight management and regular exercise should be initiated or continued. - Recommend a  download in 30 days and a sleep clinic evaluation.   Lennette Biharihomas A. Kreg Earhart, MD, [facility] Nicholas HospitalFACC Diplomate, American Board of Sleep Medicine  ELECTRONICALLY SIGNED ON:  02/06/2015, 10:23 AM [facility] PH: [phone]   FX: [phone] ACCREDITED BY THE AMERICAN ACADEMY OF SLEEP MEDICINE

## 2015-02-09 ENCOUNTER — Ambulatory Visit (INDEPENDENT_AMBULATORY_CARE_PROVIDER_SITE_OTHER): Payer: 59 | Admitting: *Deleted

## 2015-02-09 DIAGNOSIS — R55 Syncope and collapse: Secondary | ICD-10-CM

## 2015-02-10 NOTE — Progress Notes (Signed)
Carelink Summary Report / Loop Recorder 

## 2015-02-27 ENCOUNTER — Other Ambulatory Visit: Payer: Self-pay | Admitting: *Deleted

## 2015-02-27 DIAGNOSIS — G4733 Obstructive sleep apnea (adult) (pediatric): Secondary | ICD-10-CM

## 2015-03-13 ENCOUNTER — Ambulatory Visit (INDEPENDENT_AMBULATORY_CARE_PROVIDER_SITE_OTHER): Payer: BLUE CROSS/BLUE SHIELD | Admitting: *Deleted

## 2015-03-13 DIAGNOSIS — R55 Syncope and collapse: Secondary | ICD-10-CM | POA: Diagnosis not present

## 2015-03-13 NOTE — Progress Notes (Signed)
Carelink Summary Report / Loop Recorder 

## 2015-03-14 LAB — CUP PACEART REMOTE DEVICE CHECK: MDC IDC SESS DTM: 20161129203618

## 2015-03-18 LAB — CUP PACEART REMOTE DEVICE CHECK: Date Time Interrogation Session: 20161229210558

## 2015-03-23 ENCOUNTER — Encounter: Payer: Self-pay | Admitting: *Deleted

## 2015-03-23 ENCOUNTER — Encounter: Payer: Self-pay | Admitting: Nurse Practitioner

## 2015-04-10 ENCOUNTER — Ambulatory Visit (INDEPENDENT_AMBULATORY_CARE_PROVIDER_SITE_OTHER): Payer: BLUE CROSS/BLUE SHIELD | Admitting: *Deleted

## 2015-04-10 ENCOUNTER — Telehealth: Payer: Self-pay | Admitting: Internal Medicine

## 2015-04-10 DIAGNOSIS — R55 Syncope and collapse: Secondary | ICD-10-CM | POA: Diagnosis not present

## 2015-04-10 NOTE — Telephone Encounter (Signed)
Please call,said you were scheduling some things forr him. Pt siad he had not heard from you.

## 2015-04-11 NOTE — Progress Notes (Signed)
Carelink Summary Report / Loop Recorder 

## 2015-04-13 ENCOUNTER — Other Ambulatory Visit: Payer: Self-pay | Admitting: *Deleted

## 2015-04-13 ENCOUNTER — Other Ambulatory Visit: Payer: Self-pay | Admitting: Orthopedic Surgery

## 2015-04-13 DIAGNOSIS — R931 Abnormal findings on diagnostic imaging of heart and coronary circulation: Secondary | ICD-10-CM

## 2015-04-13 DIAGNOSIS — M545 Low back pain: Secondary | ICD-10-CM

## 2015-04-13 NOTE — Telephone Encounter (Signed)
I called and spoke with the patient. He is aware that his order has been placed for his Cardiac MRI to be scheduled. Staff message sent to [facility] today as well. He is stating that he is having increased SOB as well. I advised I will watch for the date of when his Cardiac MRI is scheduled for, then we can schedule follow up with Dr. Graciela Husbands post MRI. He is agreeable with this.  I also advised him I spoke with [facility] regarding his C-PAP. She states that in reviewing his chart, an order was sent to [facility] to follow up with the patient for this. She will call [facility] to see what is going on with C-PAP.

## 2015-04-18 ENCOUNTER — Encounter: Payer: Self-pay | Admitting: *Deleted

## 2015-04-18 ENCOUNTER — Encounter: Payer: Self-pay | Admitting: Internal Medicine

## 2015-04-22 ENCOUNTER — Ambulatory Visit
Admission: RE | Admit: 2015-04-22 | Discharge: 2015-04-22 | Disposition: A | Discharge status: Home / Self Care | Payer: BLUE CROSS/BLUE SHIELD | Source: Ambulatory Visit | Attending: Orthopedic Surgery | Admitting: Orthopedic Surgery

## 2015-04-22 DIAGNOSIS — M545 Low back pain: Secondary | ICD-10-CM

## 2015-04-24 LAB — CUP PACEART REMOTE DEVICE CHECK: Date Time Interrogation Session: 20170227210734

## 2015-04-24 NOTE — Progress Notes (Signed)
Carelink summary report received. Battery status OK. Normal device function. No new symptom episodes, tachy episodes, brady, or pause episodes. No new AF episodes. Monthly summary reports and ROV/PRN 

## 2015-05-01 ENCOUNTER — Ambulatory Visit (HOSPITAL_COMMUNITY)
Admission: RE | Admit: 2015-05-01 | Discharge: 2015-05-01 | Disposition: A | Discharge status: Home / Self Care | Payer: BLUE CROSS/BLUE SHIELD | Source: Ambulatory Visit | Attending: Internal Medicine | Admitting: Internal Medicine

## 2015-05-01 DIAGNOSIS — R931 Abnormal findings on diagnostic imaging of heart and coronary circulation: Secondary | ICD-10-CM | POA: Insufficient documentation

## 2015-05-01 LAB — CREATININE, SERUM: Creatinine, Ser: 1 mg/dL (ref 0.61–1.24)

## 2015-05-01 MED ORDER — GADOBENATE DIMEGLUMINE 529 MG/ML IV SOLN
36.0000 mL | Freq: Once | INTRAVENOUS | Status: AC | PRN
  Administered 2015-05-01: 36 mL via INTRAVENOUS

## 2015-05-08 ENCOUNTER — Encounter: Payer: Self-pay | Admitting: Internal Medicine

## 2015-05-08 ENCOUNTER — Ambulatory Visit (INDEPENDENT_AMBULATORY_CARE_PROVIDER_SITE_OTHER): Payer: BLUE CROSS/BLUE SHIELD | Admitting: Internal Medicine

## 2015-05-08 VITALS — BP 148/92 | HR 58 | Ht 76.0 in | Wt 289.8 lb

## 2015-05-08 DIAGNOSIS — R0600 Dyspnea, unspecified: Secondary | ICD-10-CM | POA: Diagnosis not present

## 2015-05-08 DIAGNOSIS — I483 Typical atrial flutter: Secondary | ICD-10-CM

## 2015-05-08 DIAGNOSIS — I10 Essential (primary) hypertension: Secondary | ICD-10-CM | POA: Diagnosis not present

## 2015-05-08 LAB — CUP PACEART INCLINIC DEVICE CHECK: MDC IDC SESS DTM: 20170327115112

## 2015-05-08 MED ORDER — APIXABAN 5 MG PO TABS: 5.0000 mg | ORAL_TABLET | Freq: Two times a day (BID) | ORAL | Status: DC

## 2015-05-08 MED ORDER — ATENOLOL 25 MG PO TABS: ORAL_TABLET | ORAL | Status: DC

## 2015-05-08 NOTE — Patient Instructions (Signed)
Medication Instructions: - Your physician recommends that you continue on your current medications as directed. Please refer to the Current Medication list given to you today.  Labwork: - none  Procedures/Testing: - [facility] today at 12:30 pm for the rest of the images for the Cardiac MRI.  Follow-Up: - Your physician wants you to follow-up in: 6 months with Dr. Graciela HusbandsKlein. You will receive a reminder letter in the mail two months in advance. If you don't receive a letter, please call our office to schedule the follow-up appointment.  Any Additional Special Instructions Will Be Listed Below (If Applicable).     If you need a refill on your cardiac medications before your next appointment, please call your pharmacy.

## 2015-05-08 NOTE — Progress Notes (Signed)
his hip replacement      Patient Care Team: Kaleen Mask, MD as PCP - General (Family Medicine)   HPI  Curtis Barton is a 60 y.o. male Seen in followup for atrial flutter for which he underwent catheter ablation while in hospital 9/15.  Echocardiogram at that time demonstrated normal left ventricular function.  CHADS-VASc score was 3 with hypertension and presumed TIAs    Hip  replacement surgery 8/16 ; biceps replacement surgery 12/16. He is struggling with the implications of ongoing medical illnesses.  Over the last couple of months, he has noted significant change in his exercise capacity manifested by dyspnea on exertion and a midsternal-shoulder chest discomfort. These both relieved with rest and/or recurrent with repeated exertion  LHC 4/16 >>50% LAD. He continues to struggle with dyspnea  Echo demonstrated Large R  Side suggestive of R>L shunt and he has undergone MRI but results not avialable as images were incomplete  ILR insertion 8/16 for presyncope and tachypalpitations;     Cardiac risk factors include hypertension, vascular disease, and former smoking;  .             Past Medical History  Diagnosis Date  . Hypertension   . Arthritis   . TIA (transient ischemic attack) 2010  . Atrial flutter (HCC)     a. s/p ablation  . Primary localized osteoarthritis of right knee   . Asthma     as child  . Cancer (HCC)   . Fuchs' uveitis syndrome     left eye  . Difficult intubation     maybe 8 years years ago, no trouble since then  . Dysrhythmia   . Heart murmur   . Stroke (HCC)   . Sleep apnea     home sleep study shows some sleep apnea  . Kidney stone     Past Surgical History  Procedure Laterality Date  . Hernia repair    . Tonsillectomy    . Knee cartilage surgery Bilateral   . Acle repair Right 2000    ACL repair  . Cystoscopy w/ ureteral stent placement  1999  . Rotator cuff repair Right 2005  . Atrial ablation surgery  10/2013    Dr  Graciela Husbands  . Eye surgery    . Cataract extraction w/ intraocular lens  implant, bilateral    . Kidney surgery    . Total knee arthroplasty Right 11/26/2013    dr caffrey  . Total knee arthroplasty Right 11/26/2013    Procedure: RIGHT TOTAL KNEE ARTHROPLASTY;  Surgeon: Thera Flake., MD;  Location: [facility] OR;  Service: Orthopedics;  Laterality: Right;  . Atrial flutter ablation N/A 10/13/2013    Procedure: ATRIAL FLUTTER ABLATION;  Surgeon: Duke Salvia, MD;  Location: [facility] CATH LAB;  Service: Cardiovascular;  Laterality: N/A;  . Left heart catheterization with coronary angiogram N/A 05/17/2014    Procedure: LEFT HEART CATHETERIZATION WITH CORONARY ANGIOGRAM;  Surgeon: Corky Crafts, MD;  Location: [facility] CATH LAB;  Service: Cardiovascular;  Laterality: N/A;  . Ep implantable device N/A 10/12/2014    Procedure: Loop Recorder Insertion;  Surgeon: Duke Salvia, MD;  Location: [facility] INVASIVE CV LAB;  Service: Cardiovascular;  Laterality: N/A;  . Total hip arthroplasty Right 12/02/2014    Procedure: TOTAL HIP ARTHROPLASTY;  Surgeon: Frederico Hamman, MD;  Location: [facility] OR;  Service: Orthopedics;  Laterality: Right;    Current Outpatient Prescriptions  Medication Sig Dispense Refill  . acetaminophen (ACETAMINOPHEN 8 HOUR) 650  MG CR tablet Take 1,300 mg by mouth as needed for pain.    Marland Kitchen. apixaban (ELIQUIS) 5 MG TABS tablet Take 1 tablet (5 mg total) by mouth 2 (two) times daily. 180 tablet 3  . atenolol (TENORMIN) 25 MG tablet Take one tablet by mouth twice daily as directed 180 tablet 3  . cyclobenzaprine (FLEXERIL) 10 MG tablet Take 10 mg by mouth 3 (three) times daily as needed for muscle spasms.    Marland Kitchen. HYDROmorphone (DILAUDID) 2 MG tablet 1-2 tabs po q4-6hrs prn pain (Patient taking differently: Take 2 mg by mouth every 4 (four) hours as needed for severe pain. 1-2 tabs po q4-6hrs prn pain) 100 tablet 0   No current facility-administered medications for this visit.    Allergies  Allergen Reactions  .  Nucynta [Tapentadol] Itching  . Codeine Itching  . Hydrocodone Itching  . Oxycodone Itching    Review of Systems negative except from HPI and PMH  Physical Exam BP 148/92 mmHg  Pulse 58  Ht 6\' 4"  (1.93 m)  Wt 289 lb 12.8 oz (131.452 kg)  BMI 35.29 kg/m2 Well developed and well nourished in no acute distress HENT normal E scleral and icterus clear Neck Supple JVP flat; carotids brisk and full Clear to ausculation  Regular rate and rhythm, no murmurs gallops or rub Soft with active bowel sounds No clubbing cyanosis  Edema Alert and oriented, grossly normal motor and sensory function Skin Warm and Dry   ECG was ordered today demonstrating sinus rhythm at 57 Intervals 17/09/41 Otherwise normal Assessment and  Plan  Atrial flutter status post ablation   Chest pain /Dyspnea  Hypertension  Sleep disordered breathing   Atrial tachycardia-nonsustained  TIA--MRI neg   Dyslipidemia     Continues with atypical chest pain. At this point I do not think it is cardiac. No further cardiac evaluation will be undertaken.  Intermittent dyspnea remains issue. I am not sure the mechanism of this although I would wonder about HFpEF particularly in the context of his increasing weight. However, in this quite intermittent and hard to us implicate a cardiac substrate issue. There've been no significant arrhythmia detected on his loop recorder excluding that as an explanation. He does not notice any association with volume overload.  MRI is pending; the event that is on illuminating, we have discussed pursuing a pulmonary evaluation. However, I have suggested that, in the context of his recent 50 pound weight gain following discontinuation of smoking, that weight loss might first target therapy. I suggested the [place] diet

## 2015-05-10 ENCOUNTER — Ambulatory Visit (INDEPENDENT_AMBULATORY_CARE_PROVIDER_SITE_OTHER): Payer: BLUE CROSS/BLUE SHIELD | Admitting: *Deleted

## 2015-05-10 DIAGNOSIS — R55 Syncope and collapse: Secondary | ICD-10-CM | POA: Diagnosis not present

## 2015-05-12 NOTE — Progress Notes (Signed)
Carelink Summary Report / Loop Recorder 

## 2015-06-09 ENCOUNTER — Ambulatory Visit (INDEPENDENT_AMBULATORY_CARE_PROVIDER_SITE_OTHER): Payer: BLUE CROSS/BLUE SHIELD | Admitting: *Deleted

## 2015-06-09 DIAGNOSIS — R55 Syncope and collapse: Secondary | ICD-10-CM

## 2015-06-12 NOTE — Progress Notes (Signed)
Carelink Summary Report / Loop Recorder 

## 2015-06-27 ENCOUNTER — Telehealth: Payer: Self-pay | Admitting: Cardiovascular Disease

## 2015-06-27 NOTE — Telephone Encounter (Signed)
New Message  Pt calling to speak w/ RN- concerning pt's f/u w/ Dr Gurney MaxinKely- Please call back and discuss.

## 2015-06-27 NOTE — Telephone Encounter (Signed)
Spoke with pt, he is a pt of steve klein, he has had the sleep study done and has CPAP. He has not been seen since starting the CPAP and [facility] said he needs to be seen by June due to his orders. He needs an appointment in the sleep clinic with dr Tresa Endokelly. Will forward to schedulers to call pt and get scheduled.

## 2015-07-06 ENCOUNTER — Ambulatory Visit (INDEPENDENT_AMBULATORY_CARE_PROVIDER_SITE_OTHER): Payer: BLUE CROSS/BLUE SHIELD | Admitting: Cardiovascular Disease

## 2015-07-06 ENCOUNTER — Encounter: Payer: Self-pay | Admitting: Cardiovascular Disease

## 2015-07-06 VITALS — BP 156/90 | HR 49 | Ht 75.0 in | Wt 278.4 lb

## 2015-07-06 DIAGNOSIS — I10 Essential (primary) hypertension: Secondary | ICD-10-CM

## 2015-07-06 DIAGNOSIS — G4733 Obstructive sleep apnea (adult) (pediatric): Secondary | ICD-10-CM

## 2015-07-06 DIAGNOSIS — E669 Obesity, unspecified: Secondary | ICD-10-CM | POA: Diagnosis not present

## 2015-07-06 DIAGNOSIS — I2510 Atherosclerotic heart disease of native coronary artery without angina pectoris: Secondary | ICD-10-CM

## 2015-07-06 DIAGNOSIS — Z9989 Dependence on other enabling machines and devices: Principal | ICD-10-CM

## 2015-07-06 NOTE — Patient Instructions (Signed)
Your physician recommends that you schedule a follow-up appointment  As needed with Dr Tresa EndoKelly for sleep.

## 2015-07-08 ENCOUNTER — Encounter: Payer: Self-pay | Admitting: Cardiovascular Disease

## 2015-07-08 DIAGNOSIS — E669 Obesity, unspecified: Secondary | ICD-10-CM | POA: Insufficient documentation

## 2015-07-08 DIAGNOSIS — I25110 Atherosclerotic heart disease of native coronary artery with unstable angina pectoris: Secondary | ICD-10-CM | POA: Insufficient documentation

## 2015-07-08 DIAGNOSIS — G4733 Obstructive sleep apnea (adult) (pediatric): Secondary | ICD-10-CM | POA: Insufficient documentation

## 2015-07-08 DIAGNOSIS — Z9989 Dependence on other enabling machines and devices: Principal | ICD-10-CM

## 2015-07-08 LAB — CUP PACEART REMOTE DEVICE CHECK: Date Time Interrogation Session: 20170329213536

## 2015-07-08 NOTE — Progress Notes (Signed)
Carelink summary report received. Battery status OK. Normal device function. No new tachy episodes, brady, or pause episodes. No new AF episodes. 2 symptom episodes, 1 SR, 1 short run AT. Monthly summary reports and ROV/PRN

## 2015-07-08 NOTE — Progress Notes (Signed)
Patient ID: Curtis Barton, male   DOB: 03-Jul-1955, 60 y.o.   MRN: [id number]     Primary M.D.: Dr. Claris Gower Primary cardiologist: Dr. Virl Axe  HPI: Curtis Barton is a 60 y.o. male who was recently started on CPAP therapy.  He presents for evaluation.  [name] has a history of significant obesity with weight fluctuation.  He has a history of atrial flutter and underwent catheter ablation in September 2015.  Has a history of hypertension.  Due to exertional dyspnea administered.  Sternal and shoulder discomfort.  He underwent cardiac catheterization in April 2016 which showed 50% LAD stenosis.  The patient states that he had weighed as high as 315 pounds several years ago but had lost weight down to 230 pounds.  Unfortunately he has gained a lot of this weight back and now weighs around 280 pounds.  He has a history of prior tobacco use and quit smoking in 2015.  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  Due to concerns for obstructive sleep apnea, he was referred by Dr. Caryl Comes for home sleep study which reportedly demonstrated mild obstructive sleep apnea.  I do not have his formal home study.  He was referred for a CPAP titration at [facility], which was done on 02/06/2015.  Sleep efficiency was 81.3%.  With CPAP.  He spent 3.76%.  The Dighton stage I, 67.31% in stage II, 3.18% in stage III, and was able to sleep for 25.74% in rem sleep.  He was titrated up to a 9 cm water pressure.  Of note, he did have significant periodic limb movements with an index of 92.16.  The patient admits to leg cramps.  Since initiating CPAP therapy on 05/08/2015.  He has felt well.  His DME company is [facility].  He has a ResMed AirSense10 CPAP Auto unit.  I was able to obtain a download today in the office.  He is meeting compliance with 97% of days used.  An 83% of the time with greater than 4  hours use.  He is averaging 5 hours and 33 minutes per night.  His 95th percentile pressure is 9.7 cm water.  AHI is excellent at 1.1.  He typically goes to bed at 11 PM and is always up by 7.   Epworth Sleepiness Scale: Situation   Chance of Dozing/Sleeping (0 = never , 1 = slight chance , 2 = moderate chance , 3 = high chance )   sitting and reading 2   watching TV 2   sitting inactive in a public place 1   being a passenger in a motor vehicle for an hour or more 3   lying down in the afternoon 3   sitting and talking to someone 0   sitting quietly after lunch (no alcohol) 2   while stopped for a few minutes in traffic as the driver 0   Total Score  13    Past Medical History  Diagnosis Date  . Hypertension   . Arthritis   . TIA (transient ischemic attack) 2010  . Atrial flutter ([place])     a. s/p ablation  . Primary localized osteoarthritis of right knee   . Asthma     as child  . Cancer ([place])   . Fuchs' uveitis syndrome     left eye  . Difficult intubation  maybe 8 years years ago, no trouble since then  . Dysrhythmia   . Heart murmur   . Stroke ([place])   . Sleep apnea     home sleep study shows some sleep apnea  . Kidney stone     Past Surgical History  Procedure Laterality Date  . Hernia repair    . Tonsillectomy    . Knee cartilage surgery Bilateral   . Acle repair Right 2000    ACL repair  . Cystoscopy w/ ureteral stent placement  1999  . Rotator cuff repair Right 2005  . Atrial ablation surgery  10/2013    Dr Caryl Comes  . Eye surgery    . Cataract extraction w/ intraocular lens  implant, bilateral    . Kidney surgery    . Total knee arthroplasty Right 11/26/2013    dr caffrey  . Total knee arthroplasty Right 11/26/2013    Procedure: RIGHT TOTAL KNEE ARTHROPLASTY;  Surgeon: Yvette Rack., MD;  Location: [place];  Service: Orthopedics;  Laterality: Right;  . Atrial flutter ablation N/A 10/13/2013    Procedure: ATRIAL FLUTTER ABLATION;  Surgeon: Deboraha Sprang,  MD;  Location: [facility] CATH LAB;  Service: Cardiovascular;  Laterality: N/A;  . Left heart catheterization with coronary angiogram N/A 05/17/2014    Procedure: LEFT HEART CATHETERIZATION WITH CORONARY ANGIOGRAM;  Surgeon: Jettie Booze, MD;  Location: [facility] CATH LAB;  Service: Cardiovascular;  Laterality: N/A;  . Ep implantable device N/A 10/12/2014    Procedure: Loop Recorder Insertion;  Surgeon: Deboraha Sprang, MD;  Location: [place] CV LAB;  Service: Cardiovascular;  Laterality: N/A;  . Total hip arthroplasty Right 12/02/2014    Procedure: TOTAL HIP ARTHROPLASTY;  Surgeon: Earlie Server, MD;  Location: [place];  Service: Orthopedics;  Laterality: Right;    Allergies  Allergen Reactions  . Nucynta [Tapentadol] Itching  . Codeine Itching  . Hydrocodone Itching  . Oxycodone Itching    Current Outpatient Prescriptions  Medication Sig Dispense Refill  . acetaminophen (ACETAMINOPHEN 8 HOUR) 650 MG CR tablet Take 1,300 mg by mouth as needed for pain.    Marland Kitchen apixaban (ELIQUIS) 5 MG TABS tablet Take 1 tablet (5 mg total) by mouth 2 (two) times daily. 180 tablet 3  . atenolol (TENORMIN) 25 MG tablet Take one tablet by mouth twice daily as directed 180 tablet 3  . cyclobenzaprine (FLEXERIL) 10 MG tablet Take 10 mg by mouth 3 (three) times daily as needed for muscle spasms.     No current facility-administered medications for this visit.    Social History   Social History  . Marital Status: Married    Spouse Name: N/A  . Number of Children: N/A  . Years of Education: N/A   Occupational History  . Not on file.   Social History Main Topics  . Smoking status: Former Smoker -- 1.00 packs/day    Types: Cigarettes    Quit date: 10/12/2013  . Smokeless tobacco: Never Used  . Alcohol Use: No  . Drug Use: No  . Sexual Activity: Not on file   Other Topics Concern  . 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  He keeps himself busy.  He is the cousin of Dr. Claris Gower.  Family History  Problem Relation Age of Onset  . Heart disease    .  Hypertension    . Cancer    . Heart disease Mother   . Cancer Father      ROS General: Negative; No fevers, chills, or night sweats HEENT: Negative; No changes in vision or hearing, sinus congestion, difficulty swallowing Pulmonary: Negative; No cough, wheezing, shortness of breath, hemoptysis Cardiovascular: Negative; No chest pain, presyncope, syncope, palpatations GI: Negative; No nausea, vomiting, diarrhea, or abdominal pain GU: Negative; No dysuria, hematuria, or difficulty voiding Musculoskeletal: Negative; no myalgias, joint pain, or weakness Hematologic: Negative; no easy bruising, bleeding Endocrine: Negative; no heat/cold intolerance Neuro: Negative; no changes in balance, headaches Skin: Negative; No rashes or skin lesions Psychiatric: Negative; No behavioral problems, depression Sleep: Positive for OSA, now on CPAP; he admits to leg cramps, particularly when he is working long hours but denies classic restless legs.  No bruxism, hypnogognic hallucinations, no cataplexy   Physical Exam BP 156/90 mmHg  Pulse 49  Ht '6\' 3"'$  (1.905 m)  Wt 278 lb 6.4 oz (126.281 kg)  BMI 34.80 kg/m2  Wt Readings from Last 3 Encounters:  07/06/15 278 lb 6.4 oz (126.281 kg)  05/08/15 289 lb 12.8 oz (131.452 kg)  01/27/15 275 lb (124.739 kg)   General: Alert, oriented, no distress.  Skin: normal turgor, no rashes HEENT: Normocephalic, atraumatic. Pupils round and reactive; sclera anicteric; extraocular muscles intact; Fundi without hemorrhages or exudates Nose without nasal septal hypertrophy Mouth/Parynx benign; Mallinpatti scale 3 Neck: No JVD, no carotid briuts Lungs: clear to ausculatation and percussion; no wheezing or rales  Chest wall: No tenderness to palpation Heart: RRR, s1 s2  normal  Abdomen: soft, nontender; no hepatosplenomehaly, BS+; abdominal aorta nontender and not dilated by palpation. Back: No CVA tenderness Pulses 2+ Extremities: no clubbinbg cyanosis or edema, Homan's sign negative  Neurologic: grossly nonfocal; cranial nerves intact. Psychological: Normal affect and mood.  ECG : Not done in the office today  Prior ECG from 05/08/2015 was independently reviewed by me.  This showed sinus bradycardia 58 bpm with mild RV conduction delay.  LABS:  BMP Latest Ref Rng 05/01/2015 12/03/2014 11/21/2014  Glucose 65 - 99 mg/dL - 123(H) 90  BUN 6 - 20 mg/dL - 11 11  Creatinine 0.61 - 1.24 mg/dL 1.00 1.06 1.10  Sodium 135 - 145 mmol/L - 138 140  Potassium 3.5 - 5.1 mmol/L - 4.5 4.7  Chloride 101 - 111 mmol/L - 103 106  CO2 22 - 32 mmol/L - 26 28  Calcium 8.9 - 10.3 mg/dL - 8.9 9.4     Hepatic Function Latest Ref Rng 11/21/2014 11/17/2013  Total Protein 6.5 - 8.1 g/dL 7.1 7.6  Albumin 3.5 - 5.0 g/dL 3.7 3.7  AST 15 - 41 U/L 19 22  ALT 17 - 63 U/L 22 19  Alk Phosphatase 38 - 126 U/L 53 81  Total Bilirubin 0.3 - 1.2 mg/dL 0.6 0.6     CBC Latest Ref Rng 12/05/2014 12/04/2014 12/03/2014  WBC 4.0 - 10.5 K/uL 9.5 9.3 10.9(H)  Hemoglobin 13.0 - 17.0 g/dL 10.1(L) 10.3(L) 11.5(L)  Hematocrit 39.0 - 52.0 % 30.5(L) 31.7(L) 34.1(L)  Platelets 150 - 400 K/uL 162 157 183     Lipid Panel     Component Value Date/Time   CHOL 190 05/16/2014 1022   TRIG 144.0 05/16/2014 1022   HDL 40.30 05/16/2014 1022   CHOLHDL 5 05/16/2014 1022   VLDL 28.8 05/16/2014 1022   LDLCALC 121* 05/16/2014 1022   LDLDIRECT 140.0 05/16/2014 1022     RADIOLOGY: No results  found.    ASSESSMENT AND PLAN: Mr. Ernie Kasler is a 60 year old Caucasian male who has a history of cardiovascular comorbidities including atrial flutter and is status post atrial flutter ablation.  He has a history of significant obesity and in the past.  It weighed 315 pounds.  He had lost weight down  to 230, but has gained back up to 278 pounds.  He has a history of hypertension and mild CAD.  He was found to have mild obstructive sleep apnea on a home sleep study.  Since initiating CPAP therapy, he has felt well.  He is sleeping well.  AHI is excellent at 1.1 and with his CPAP auto unit.  His 95th percentile pressure is 9.7 cm water.  He initially had used nasal pillows, but did develop some irritation in his nares.  He is now using a nasal mask.  I've suggested the possibility of a restaurant.  Extreme mask which he may find very beneficial and suggested that he contact [facility] for consideration of a trial of this mask.  He did have frequent periodic limb movements.  He admits to leg cramps, particularly from working hard.  If symptoms persist with normal electrolytes and creatinine.  Encouraged to move with pain keeps him up at sleep.  He may benefit from a trial of therapy for restless legs.  From a sleep perspective, he is doing well.  I will see him on an as-needed basis.  Time spent: 25 minutes  Troy Sine, MD, [facility]  07/08/2015 1:35 PM

## 2015-07-11 ENCOUNTER — Ambulatory Visit (INDEPENDENT_AMBULATORY_CARE_PROVIDER_SITE_OTHER): Payer: BLUE CROSS/BLUE SHIELD | Admitting: *Deleted

## 2015-07-11 DIAGNOSIS — R55 Syncope and collapse: Secondary | ICD-10-CM | POA: Diagnosis not present

## 2015-07-12 NOTE — Progress Notes (Signed)
Carelink Summary Report / Loop Recorder 

## 2015-07-14 ENCOUNTER — Encounter: Payer: Self-pay | Admitting: Internal Medicine

## 2015-07-21 LAB — CUP PACEART REMOTE DEVICE CHECK: MDC IDC SESS DTM: 20170428220602

## 2015-08-08 ENCOUNTER — Ambulatory Visit (INDEPENDENT_AMBULATORY_CARE_PROVIDER_SITE_OTHER): Payer: BLUE CROSS/BLUE SHIELD | Admitting: *Deleted

## 2015-08-08 DIAGNOSIS — R55 Syncope and collapse: Secondary | ICD-10-CM

## 2015-08-09 NOTE — Progress Notes (Signed)
Carelink Summary Report / Loop Recorder 

## 2015-08-18 LAB — CUP PACEART REMOTE DEVICE CHECK: MDC IDC SESS DTM: 20170528223518

## 2015-08-25 LAB — CUP PACEART REMOTE DEVICE CHECK: Date Time Interrogation Session: 20170627230540

## 2015-08-29 ENCOUNTER — Telehealth: Payer: Self-pay | Admitting: *Deleted

## 2015-08-29 DIAGNOSIS — I472 Ventricular tachycardia: Secondary | ICD-10-CM

## 2015-08-29 DIAGNOSIS — I4729 Other ventricular tachycardia: Secondary | ICD-10-CM

## 2015-08-29 NOTE — Telephone Encounter (Signed)
BMET and Mag ordered.

## 2015-08-29 NOTE — Telephone Encounter (Signed)
[facility]  Gave device [facility] phone number.  Per Dr. Odessa FlemingKlein's recommendation, will enter orders for BMET and Mag for NSVT episode on 06/24/15 (detected via symptom episode).

## 2015-08-29 NOTE — Telephone Encounter (Signed)
Mr. Joelene MillinOliver returning call. I made him aware of lab work recommended by Dr. Graciela HusbandsKlein. Irving Burtonmily to enter orders. He is out of town until next Tuesday. He will come Tuesday 09/05/15 for labwork. Appt made.

## 2015-08-29 NOTE — Addendum Note (Signed)
Addended by: Doroteo GlassmanMILIANO, Laneisha Mino C on: 08/29/2015 10:05 AM   Modules accepted: Orders

## 2015-09-05 ENCOUNTER — Other Ambulatory Visit (INDEPENDENT_AMBULATORY_CARE_PROVIDER_SITE_OTHER): Payer: BLUE CROSS/BLUE SHIELD

## 2015-09-05 DIAGNOSIS — I472 Ventricular tachycardia: Secondary | ICD-10-CM | POA: Diagnosis not present

## 2015-09-05 DIAGNOSIS — I4729 Other ventricular tachycardia: Secondary | ICD-10-CM

## 2015-09-05 LAB — BASIC METABOLIC PANEL
BUN: 12 mg/dL (ref 7–25)
CHLORIDE: 104 mmol/L (ref 98–110)
CO2: 28 mmol/L (ref 20–31)
CREATININE: 0.94 mg/dL (ref 0.70–1.33)
Calcium: 9 mg/dL (ref 8.6–10.3)
Glucose, Bld: 93 mg/dL (ref 65–99)
Potassium: 4.3 mmol/L (ref 3.5–5.3)
Sodium: 140 mmol/L (ref 135–146)

## 2015-09-05 LAB — MAGNESIUM: Magnesium: 2.1 mg/dL (ref 1.5–2.5)

## 2015-09-07 ENCOUNTER — Ambulatory Visit (INDEPENDENT_AMBULATORY_CARE_PROVIDER_SITE_OTHER): Payer: BLUE CROSS/BLUE SHIELD | Admitting: *Deleted

## 2015-09-07 DIAGNOSIS — R55 Syncope and collapse: Secondary | ICD-10-CM

## 2015-09-08 NOTE — Progress Notes (Signed)
Carelink Summary Report / Loop Recorder 

## 2015-09-15 ENCOUNTER — Other Ambulatory Visit: Payer: Self-pay | Admitting: Sports Medicine

## 2015-09-15 ENCOUNTER — Encounter: Payer: Self-pay | Admitting: Internal Medicine

## 2015-09-15 DIAGNOSIS — M25511 Pain in right shoulder: Secondary | ICD-10-CM

## 2015-09-18 ENCOUNTER — Other Ambulatory Visit: Payer: Self-pay | Admitting: Sports Medicine

## 2015-09-18 DIAGNOSIS — M25511 Pain in right shoulder: Secondary | ICD-10-CM

## 2015-09-24 ENCOUNTER — Other Ambulatory Visit: Payer: BLUE CROSS/BLUE SHIELD

## 2015-10-02 ENCOUNTER — Ambulatory Visit
Admission: RE | Admit: 2015-10-02 | Discharge: 2015-10-02 | Disposition: A | Discharge status: Home / Self Care | Payer: BLUE CROSS/BLUE SHIELD | Source: Ambulatory Visit | Attending: Sports Medicine | Admitting: Sports Medicine

## 2015-10-02 ENCOUNTER — Telehealth: Payer: Self-pay | Admitting: Internal Medicine

## 2015-10-02 DIAGNOSIS — M25511 Pain in right shoulder: Secondary | ICD-10-CM

## 2015-10-02 MED ORDER — IOPAMIDOL (ISOVUE-M 200) INJECTION 41%: 15.0000 mL | Freq: Once | INTRAMUSCULAR | Status: DC

## 2015-10-02 NOTE — Telephone Encounter (Signed)
[name] is calling to find out if  Curtis Barton can have an MRI , because he has a loop recorder , if so please fax it over to fax#[phone]   Thanks

## 2015-10-03 ENCOUNTER — Other Ambulatory Visit: Payer: Self-pay | Admitting: Orthopedic Surgery

## 2015-10-03 DIAGNOSIS — M25511 Pain in right shoulder: Secondary | ICD-10-CM

## 2015-10-03 NOTE — Telephone Encounter (Signed)
Returned call and gave verbal clearance for pt to have MRI with no interference to ILR. I additionally gave [employer] contact info and requested that if there was an order that needed to be signed the imaging center would have to fax this to our office for MD signature.

## 2015-10-06 LAB — CUP PACEART REMOTE DEVICE CHECK: MDC IDC SESS DTM: 20170728000533

## 2015-10-09 ENCOUNTER — Ambulatory Visit (INDEPENDENT_AMBULATORY_CARE_PROVIDER_SITE_OTHER): Payer: BLUE CROSS/BLUE SHIELD | Admitting: *Deleted

## 2015-10-09 DIAGNOSIS — R55 Syncope and collapse: Secondary | ICD-10-CM | POA: Diagnosis not present

## 2015-10-10 NOTE — Progress Notes (Signed)
Carelink Summary Report / Loop Recorder 

## 2015-10-12 ENCOUNTER — Ambulatory Visit
Admission: RE | Admit: 2015-10-12 | Discharge: 2015-10-12 | Disposition: A | Discharge status: Home / Self Care | Payer: BLUE CROSS/BLUE SHIELD | Source: Ambulatory Visit | Attending: Orthopedic Surgery | Admitting: Orthopedic Surgery

## 2015-10-12 DIAGNOSIS — M25511 Pain in right shoulder: Secondary | ICD-10-CM

## 2015-11-01 ENCOUNTER — Encounter: Payer: Self-pay | Admitting: Internal Medicine

## 2015-11-03 ENCOUNTER — Telehealth: Payer: Self-pay | Admitting: Internal Medicine

## 2015-11-03 ENCOUNTER — Other Ambulatory Visit: Payer: Self-pay | Admitting: Orthopedic Surgery

## 2015-11-03 ENCOUNTER — Encounter: Payer: Self-pay | Admitting: Internal Medicine

## 2015-11-03 NOTE — Telephone Encounter (Signed)
SPOKE WITH PT WILL STOP  apixoban AS per orthopedic recs No change in functional status Ok to pursue surgery

## 2015-11-04 LAB — CUP PACEART REMOTE DEVICE CHECK: MDC IDC SESS DTM: 20170827000610

## 2015-11-04 NOTE — Progress Notes (Signed)
[facility] summary report received. Battery status OK. Normal device function. No new tachy episodes, brady, or pause episodes. No new AF episodes. 1 symptom episode, SR.  Monthly summary reports and ROV/PRN 

## 2015-11-06 ENCOUNTER — Ambulatory Visit (INDEPENDENT_AMBULATORY_CARE_PROVIDER_SITE_OTHER): Payer: BLUE CROSS/BLUE SHIELD | Admitting: *Deleted

## 2015-11-06 ENCOUNTER — Encounter (HOSPITAL_COMMUNITY): Payer: Self-pay | Admitting: *Deleted

## 2015-11-06 DIAGNOSIS — R55 Syncope and collapse: Secondary | ICD-10-CM

## 2015-11-06 MED ORDER — DEXTROSE 5 % IV SOLN
3.0000 g | INTRAVENOUS | Status: AC
  Administered 2015-11-07: 3 g via INTRAVENOUS
  Filled 2015-11-06: qty 3000

## 2015-11-07 ENCOUNTER — Observation Stay (HOSPITAL_COMMUNITY)
Admission: RE | Admit: 2015-11-07 | Discharge: 2015-11-08 | Disposition: A | Discharge status: Home / Self Care | Payer: BLUE CROSS/BLUE SHIELD | Source: Ambulatory Visit | Attending: Orthopedic Surgery | Admitting: Orthopedic Surgery

## 2015-11-07 ENCOUNTER — Ambulatory Visit (HOSPITAL_COMMUNITY): Payer: BLUE CROSS/BLUE SHIELD | Admitting: Anesthesiology

## 2015-11-07 ENCOUNTER — Encounter (HOSPITAL_COMMUNITY): Payer: Self-pay | Admitting: *Deleted

## 2015-11-07 ENCOUNTER — Encounter (HOSPITAL_COMMUNITY)
Admission: RE | Disposition: A | Discharge status: Home / Self Care | Payer: Self-pay | Source: Ambulatory Visit | Attending: Orthopedic Surgery

## 2015-11-07 DIAGNOSIS — G4730 Sleep apnea, unspecified: Secondary | ICD-10-CM | POA: Diagnosis not present

## 2015-11-07 DIAGNOSIS — Z87442 Personal history of urinary calculi: Secondary | ICD-10-CM | POA: Diagnosis not present

## 2015-11-07 DIAGNOSIS — M1711 Unilateral primary osteoarthritis, right knee: Secondary | ICD-10-CM | POA: Diagnosis not present

## 2015-11-07 DIAGNOSIS — Z6834 Body mass index (BMI) 34.0-34.9, adult: Secondary | ICD-10-CM | POA: Diagnosis not present

## 2015-11-07 DIAGNOSIS — Z888 Allergy status to other drugs, medicaments and biological substances status: Secondary | ICD-10-CM | POA: Insufficient documentation

## 2015-11-07 DIAGNOSIS — M24011 Loose body in right shoulder: Secondary | ICD-10-CM | POA: Insufficient documentation

## 2015-11-07 DIAGNOSIS — Z8673 Personal history of transient ischemic attack (TIA), and cerebral infarction without residual deficits: Secondary | ICD-10-CM | POA: Diagnosis not present

## 2015-11-07 DIAGNOSIS — M75121 Complete rotator cuff tear or rupture of right shoulder, not specified as traumatic: Principal | ICD-10-CM | POA: Insufficient documentation

## 2015-11-07 DIAGNOSIS — Z9889 Other specified postprocedural states: Secondary | ICD-10-CM

## 2015-11-07 DIAGNOSIS — K760 Fatty (change of) liver, not elsewhere classified: Secondary | ICD-10-CM | POA: Diagnosis not present

## 2015-11-07 DIAGNOSIS — Z885 Allergy status to narcotic agent status: Secondary | ICD-10-CM | POA: Insufficient documentation

## 2015-11-07 DIAGNOSIS — Z7901 Long term (current) use of anticoagulants: Secondary | ICD-10-CM | POA: Diagnosis not present

## 2015-11-07 DIAGNOSIS — I10 Essential (primary) hypertension: Secondary | ICD-10-CM | POA: Insufficient documentation

## 2015-11-07 DIAGNOSIS — I209 Angina pectoris, unspecified: Secondary | ICD-10-CM | POA: Insufficient documentation

## 2015-11-07 DIAGNOSIS — I4892 Unspecified atrial flutter: Secondary | ICD-10-CM | POA: Diagnosis not present

## 2015-11-07 HISTORY — DX: Fatty (change of) liver, not elsewhere classified: K76.0

## 2015-11-07 HISTORY — PX: SHOULDER ARTHROSCOPY WITH ROTATOR CUFF REPAIR: SHX5685

## 2015-11-07 HISTORY — DX: Personal history of urinary calculi: Z87.442

## 2015-11-07 LAB — COMPREHENSIVE METABOLIC PANEL
ALBUMIN: 3.5 g/dL (ref 3.5–5.0)
ALT: 17 U/L (ref 17–63)
AST: 20 U/L (ref 15–41)
Alkaline Phosphatase: 57 U/L (ref 38–126)
Anion gap: 4 — ABNORMAL LOW (ref 5–15)
BUN: 8 mg/dL (ref 6–20)
CHLORIDE: 110 mmol/L (ref 101–111)
CO2: 25 mmol/L (ref 22–32)
CREATININE: 0.96 mg/dL (ref 0.61–1.24)
Calcium: 8.9 mg/dL (ref 8.9–10.3)
GFR calc Af Amer: >60 mL/min (ref >60)
GLUCOSE: 101 mg/dL — AB (ref 65–99)
POTASSIUM: 4.4 mmol/L (ref 3.5–5.1)
Sodium: 139 mmol/L (ref 135–145)
Total Bilirubin: 0.7 mg/dL (ref 0.3–1.2)
Total Protein: 6.4 g/dL — ABNORMAL LOW (ref 6.5–8.1)

## 2015-11-07 LAB — CBC
HEMATOCRIT: 42.1 % (ref 39.0–52.0)
Hemoglobin: 13.8 g/dL (ref 13.0–17.0)
MCH: 32.3 pg (ref 26.0–34.0)
MCHC: 32.8 g/dL (ref 30.0–36.0)
MCV: 98.6 fL (ref 78.0–100.0)
PLATELETS: 199 10*3/uL (ref 150–400)
RBC: 4.27 MIL/uL (ref 4.22–5.81)
RDW: 13.1 % (ref 11.5–15.5)
WBC: 5.8 10*3/uL (ref 4.0–10.5)

## 2015-11-07 SURGERY — ARTHROSCOPY, SHOULDER, WITH ROTATOR CUFF REPAIR
Anesthesia: General | Site: Shoulder | Laterality: Right

## 2015-11-07 MED ORDER — PHENYLEPHRINE HCL 10 MG/ML IJ SOLN
INTRAVENOUS | Status: DC | PRN
  Administered 2015-11-07: 30 ug/min via INTRAVENOUS

## 2015-11-07 MED ORDER — SODIUM CHLORIDE 0.9 % IV SOLN
INTRAVENOUS | Status: DC
  Administered 2015-11-08: 08:00:00 via INTRAVENOUS

## 2015-11-07 MED ORDER — FENTANYL CITRATE (PF) 100 MCG/2ML IJ SOLN
INTRAMUSCULAR | Status: AC
  Filled 2015-11-07: qty 2

## 2015-11-07 MED ORDER — SODIUM CHLORIDE 0.9 % IR SOLN
Status: DC | PRN
  Administered 2015-11-07: 12000 mL
  Administered 2015-11-07: 6000 mL

## 2015-11-07 MED ORDER — SUGAMMADEX SODIUM 500 MG/5ML IV SOLN
INTRAVENOUS | Status: DC | PRN
  Administered 2015-11-07: 300 mg via INTRAVENOUS

## 2015-11-07 MED ORDER — PROPOFOL 10 MG/ML IV BOLUS
INTRAVENOUS | Status: AC
  Filled 2015-11-07: qty 20

## 2015-11-07 MED ORDER — ACETAMINOPHEN 325 MG PO TABS: 650.0000 mg | ORAL_TABLET | Freq: Four times a day (QID) | ORAL | Status: DC | PRN

## 2015-11-07 MED ORDER — DOCUSATE SODIUM 100 MG PO CAPS
100.0000 mg | ORAL_CAPSULE | Freq: Two times a day (BID) | ORAL | Status: DC
  Administered 2015-11-07 – 2015-11-08 (×2): 100 mg via ORAL
  Filled 2015-11-07 (×2): qty 1

## 2015-11-07 MED ORDER — LIDOCAINE 2% (20 MG/ML) 5 ML SYRINGE
INTRAMUSCULAR | Status: AC
Start: 2015-11-07 — End: 2015-11-07
  Filled 2015-11-07: qty 5

## 2015-11-07 MED ORDER — ONDANSETRON HCL 4 MG/2ML IJ SOLN
INTRAMUSCULAR | Status: DC | PRN
  Administered 2015-11-07: 4 mg via INTRAVENOUS

## 2015-11-07 MED ORDER — BUPIVACAINE-EPINEPHRINE (PF) 0.5% -1:200000 IJ SOLN
INTRAMUSCULAR | Status: DC | PRN
  Administered 2015-11-07: 30 mL via PERINEURAL

## 2015-11-07 MED ORDER — LIDOCAINE HCL (CARDIAC) 20 MG/ML IV SOLN
INTRAVENOUS | Status: DC | PRN
  Administered 2015-11-07: 100 mg via INTRAVENOUS

## 2015-11-07 MED ORDER — FENTANYL CITRATE (PF) 100 MCG/2ML IJ SOLN
INTRAMUSCULAR | Status: AC
  Administered 2015-11-07: 100 ug
  Filled 2015-11-07: qty 2

## 2015-11-07 MED ORDER — ALUMINUM HYDROXIDE GEL 320 MG/5ML PO SUSP
15.0000 mL | ORAL | Status: DC | PRN
  Filled 2015-11-07: qty 30

## 2015-11-07 MED ORDER — METOCLOPRAMIDE HCL 5 MG/ML IJ SOLN: 5.0000 mg | Freq: Three times a day (TID) | INTRAMUSCULAR | Status: DC | PRN

## 2015-11-07 MED ORDER — ROCURONIUM BROMIDE 10 MG/ML (PF) SYRINGE
PREFILLED_SYRINGE | INTRAVENOUS | Status: AC
  Filled 2015-11-07: qty 10

## 2015-11-07 MED ORDER — ZOLPIDEM TARTRATE 5 MG PO TABS: 5.0000 mg | ORAL_TABLET | Freq: Every evening | ORAL | Status: DC | PRN

## 2015-11-07 MED ORDER — PROPOFOL 10 MG/ML IV BOLUS
INTRAVENOUS | Status: DC | PRN
  Administered 2015-11-07: 200 mg via INTRAVENOUS

## 2015-11-07 MED ORDER — SUCCINYLCHOLINE CHLORIDE 20 MG/ML IJ SOLN
INTRAMUSCULAR | Status: DC | PRN
  Administered 2015-11-07: 120 mg via INTRAVENOUS

## 2015-11-07 MED ORDER — ONDANSETRON HCL 4 MG PO TABS
4.0000 mg | ORAL_TABLET | Freq: Four times a day (QID) | ORAL | Status: DC | PRN
Start: 2015-11-07 — End: 2015-11-08

## 2015-11-07 MED ORDER — SUCCINYLCHOLINE CHLORIDE 200 MG/10ML IV SOSY
PREFILLED_SYRINGE | INTRAVENOUS | Status: AC
  Filled 2015-11-07: qty 10

## 2015-11-07 MED ORDER — ALBUTEROL SULFATE HFA 108 (90 BASE) MCG/ACT IN AERS
INHALATION_SPRAY | RESPIRATORY_TRACT | Status: DC | PRN
Start: 2015-11-07 — End: 2015-11-07
  Administered 2015-11-07: 4 via RESPIRATORY_TRACT

## 2015-11-07 MED ORDER — DIPHENHYDRAMINE HCL 50 MG/ML IJ SOLN
INTRAMUSCULAR | Status: AC
  Filled 2015-11-07: qty 1

## 2015-11-07 MED ORDER — HYDROMORPHONE HCL 1 MG/ML IJ SOLN
INTRAMUSCULAR | Status: AC
  Filled 2015-11-07: qty 1

## 2015-11-07 MED ORDER — MIDAZOLAM HCL 2 MG/2ML IJ SOLN
INTRAMUSCULAR | Status: AC
  Administered 2015-11-07: 2 mg
  Filled 2015-11-07: qty 2

## 2015-11-07 MED ORDER — CEFAZOLIN SODIUM-DEXTROSE 2-4 GM/100ML-% IV SOLN
2.0000 g | Freq: Four times a day (QID) | INTRAVENOUS | Status: AC
  Administered 2015-11-07 – 2015-11-08 (×3): 2 g via INTRAVENOUS
  Filled 2015-11-07 (×3): qty 100

## 2015-11-07 MED ORDER — SUGAMMADEX SODIUM 500 MG/5ML IV SOLN
INTRAVENOUS | Status: AC
  Filled 2015-11-07: qty 5

## 2015-11-07 MED ORDER — PHENOL 1.4 % MT LIQD: 1.0000 | OROMUCOSAL | Status: DC | PRN

## 2015-11-07 MED ORDER — DIPHENHYDRAMINE HCL 50 MG/ML IJ SOLN
12.5000 mg | Freq: Once | INTRAMUSCULAR | Status: AC
  Administered 2015-11-07: 12.5 mg via INTRAVENOUS

## 2015-11-07 MED ORDER — ONDANSETRON HCL 4 MG/2ML IJ SOLN: 4.0000 mg | Freq: Four times a day (QID) | INTRAMUSCULAR | Status: DC | PRN

## 2015-11-07 MED ORDER — PROMETHAZINE HCL 25 MG/ML IJ SOLN: 6.2500 mg | INTRAMUSCULAR | Status: DC | PRN

## 2015-11-07 MED ORDER — MENTHOL 3 MG MT LOZG: 1.0000 | LOZENGE | OROMUCOSAL | Status: DC | PRN

## 2015-11-07 MED ORDER — MORPHINE SULFATE (PF) 2 MG/ML IV SOLN: 1.0000 mg | INTRAVENOUS | Status: DC | PRN

## 2015-11-07 MED ORDER — DOCUSATE SODIUM 100 MG PO CAPS: 100.0000 mg | ORAL_CAPSULE | Freq: Three times a day (TID) | ORAL | 0 refills | Status: DC | PRN

## 2015-11-07 MED ORDER — OXYCODONE-ACETAMINOPHEN 5-325 MG PO TABS: 1.0000 | ORAL_TABLET | ORAL | 0 refills | Status: DC | PRN

## 2015-11-07 MED ORDER — HYDROMORPHONE HCL 1 MG/ML IJ SOLN
0.2500 mg | INTRAMUSCULAR | Status: DC | PRN
  Administered 2015-11-07: 0.5 mg via INTRAVENOUS

## 2015-11-07 MED ORDER — ACETAMINOPHEN 650 MG RE SUPP
650.0000 mg | Freq: Four times a day (QID) | RECTAL | Status: DC | PRN
Start: 2015-11-07 — End: 2015-11-08

## 2015-11-07 MED ORDER — ASPIRIN EC 325 MG PO TBEC
325.0000 mg | DELAYED_RELEASE_TABLET | Freq: Every day | ORAL | Status: DC
Start: 2015-11-07 — End: 2015-11-08
  Administered 2015-11-07 – 2015-11-08 (×2): 325 mg via ORAL
  Filled 2015-11-07 (×2): qty 1

## 2015-11-07 MED ORDER — CYCLOBENZAPRINE HCL 10 MG PO TABS
10.0000 mg | ORAL_TABLET | Freq: Three times a day (TID) | ORAL | Status: DC | PRN
  Administered 2015-11-08: 10 mg via ORAL
  Filled 2015-11-07 (×2): qty 1

## 2015-11-07 MED ORDER — GLYCOPYRROLATE 0.2 MG/ML IJ SOLN
INTRAMUSCULAR | Status: DC | PRN
  Administered 2015-11-07: 0.2 mg via INTRAVENOUS
  Administered 2015-11-07 (×2): 0.1 mg via INTRAVENOUS

## 2015-11-07 MED ORDER — POVIDONE-IODINE 7.5 % EX SOLN: Freq: Once | CUTANEOUS | Status: DC

## 2015-11-07 MED ORDER — MIDAZOLAM HCL 2 MG/2ML IJ SOLN
INTRAMUSCULAR | Status: AC
  Filled 2015-11-07: qty 2

## 2015-11-07 MED ORDER — LACTATED RINGERS IV SOLN
INTRAVENOUS | Status: DC
  Administered 2015-11-07 (×2): via INTRAVENOUS

## 2015-11-07 MED ORDER — ROCURONIUM BROMIDE 100 MG/10ML IV SOLN
INTRAVENOUS | Status: DC | PRN
  Administered 2015-11-07: 50 mg via INTRAVENOUS

## 2015-11-07 MED ORDER — ATENOLOL 50 MG PO TABS
25.0000 mg | ORAL_TABLET | Freq: Two times a day (BID) | ORAL | Status: DC
  Administered 2015-11-07 – 2015-11-08 (×2): 25 mg via ORAL
  Filled 2015-11-07 (×2): qty 1

## 2015-11-07 MED ORDER — FENTANYL CITRATE (PF) 100 MCG/2ML IJ SOLN
INTRAMUSCULAR | Status: DC | PRN
  Administered 2015-11-07 (×4): 50 ug via INTRAVENOUS

## 2015-11-07 MED ORDER — METOCLOPRAMIDE HCL 5 MG PO TABS: 5.0000 mg | ORAL_TABLET | Freq: Three times a day (TID) | ORAL | Status: DC | PRN

## 2015-11-07 MED ORDER — OXYCODONE HCL 5 MG PO TABS
5.0000 mg | ORAL_TABLET | ORAL | Status: DC | PRN
  Administered 2015-11-07 – 2015-11-08 (×2): 10 mg via ORAL
  Administered 2015-11-08: 5 mg via ORAL
  Administered 2015-11-08: 10 mg via ORAL
  Filled 2015-11-07 (×4): qty 2

## 2015-11-07 MED ORDER — DIPHENHYDRAMINE HCL 12.5 MG/5ML PO ELIX
12.5000 mg | ORAL_SOLUTION | ORAL | Status: DC | PRN
  Administered 2015-11-07 – 2015-11-08 (×3): 25 mg via ORAL
  Filled 2015-11-07 (×3): qty 10

## 2015-11-07 SURGICAL SUPPLY — 61 items
ANCH SUT SWLK 19.1X4.75 VT (Anchor) ×4 IMPLANT
ANCHOR PEEK 4.75X19.1 SWLK C (Anchor) ×4 IMPLANT
BLADE LONG MED 31X9 (MISCELLANEOUS) IMPLANT
BLADE SURG 11 STRL SS (BLADE) ×2 IMPLANT
BUR OVAL 4.0 (BURR) ×2 IMPLANT
CANISTER OMNI JUG 16 LITER (MISCELLANEOUS) ×2 IMPLANT
CANNULA 5.75X71 LONG (CANNULA) ×2 IMPLANT
CHLORAPREP W/TINT 26ML (MISCELLANEOUS) ×2 IMPLANT
COVER SURGICAL LIGHT HANDLE (MISCELLANEOUS) ×2 IMPLANT
DRAPE INCISE IOBAN 66X45 STRL (DRAPES) ×2 IMPLANT
DRAPE STERI 35X30 U-POUCH (DRAPES) ×2 IMPLANT
DRAPE SURG 17X23 STRL (DRAPES) ×2 IMPLANT
DRAPE U-SHAPE 47X51 STRL (DRAPES) ×2 IMPLANT
DRSG EMULSION OIL 3X3 NADH (GAUZE/BANDAGES/DRESSINGS) ×4 IMPLANT
DRSG PAD ABDOMINAL 8X10 ST (GAUZE/BANDAGES/DRESSINGS) ×4 IMPLANT
ELECT REM PT RETURN 9FT ADLT (ELECTROSURGICAL)
ELECTRODE REM PT RTRN 9FT ADLT (ELECTROSURGICAL) IMPLANT
GAUZE SPONGE 4X4 12PLY STRL (GAUZE/BANDAGES/DRESSINGS) ×2 IMPLANT
GAUZE XEROFORM 1X8 LF (GAUZE/BANDAGES/DRESSINGS) ×2 IMPLANT
GLOVE BIO SURGEON STRL SZ7 (GLOVE) ×2 IMPLANT
GLOVE BIO SURGEON STRL SZ7.5 (GLOVE) ×2 IMPLANT
GLOVE BIOGEL PI IND STRL 7.0 (GLOVE) ×1 IMPLANT
GLOVE BIOGEL PI IND STRL 8 (GLOVE) ×1 IMPLANT
GLOVE BIOGEL PI INDICATOR 7.0 (GLOVE) ×1
GLOVE BIOGEL PI INDICATOR 8 (GLOVE) ×1
GOWN STRL REUS W/ TWL LRG LVL3 (GOWN DISPOSABLE) ×3 IMPLANT
GOWN STRL REUS W/TWL LRG LVL3 (GOWN DISPOSABLE) ×6
KIT BASIN OR (CUSTOM PROCEDURE TRAY) ×2 IMPLANT
KIT ROOM TURNOVER OR (KITS) ×2 IMPLANT
LASSO CRESCENT QUICKPASS (SUTURE) ×1 IMPLANT
MANIFOLD NEPTUNE II (INSTRUMENTS) ×2 IMPLANT
NDL HYPO 25GX1X1/2 BEV (NEEDLE) ×1 IMPLANT
NDL SCORPION MULTI FIRE (NEEDLE) IMPLANT
NDL SPNL 18GX3.5 QUINCKE PK (NEEDLE) ×1 IMPLANT
NEEDLE HYPO 25GX1X1/2 BEV (NEEDLE) ×2 IMPLANT
NEEDLE SCORPION MULTI FIRE (NEEDLE) ×2 IMPLANT
NEEDLE SPNL 18GX3.5 QUINCKE PK (NEEDLE) ×2 IMPLANT
NS IRRIG 1000ML POUR BTL (IV SOLUTION) ×2 IMPLANT
PACK SHOULDER (CUSTOM PROCEDURE TRAY) ×2 IMPLANT
PAD ABD 8X10 STRL (GAUZE/BANDAGES/DRESSINGS) ×3 IMPLANT
PAD ARMBOARD 7.5X6 YLW CONV (MISCELLANEOUS) ×4 IMPLANT
RESECTOR FULL RADIUS 4.2MM (BLADE) ×2 IMPLANT
SLEEVE SURGEON STRL (DRAPES) ×1 IMPLANT
SLING ARM FOAM STRAP LRG (SOFTGOODS) ×1 IMPLANT
SLING ARM LRG ADULT FOAM STRAP (SOFTGOODS) ×2 IMPLANT
SLING ARM MED ADULT FOAM STRAP (SOFTGOODS) IMPLANT
SPONGE GAUZE 4X4 12PLY STER LF (GAUZE/BANDAGES/DRESSINGS) ×1 IMPLANT
SPONGE LAP 4X18 X RAY DECT (DISPOSABLE) IMPLANT
SUPPORT WRAP ARM LG (MISCELLANEOUS) IMPLANT
SUT ETHILON 3 0 PS 1 (SUTURE) ×3 IMPLANT
SUT FIBERWIRE #2 38 T-5 BLUE (SUTURE)
SUT TIGER TAPE 7 IN WHITE (SUTURE) ×2 IMPLANT
SUTURE FIBERWR #2 38 T-5 BLUE (SUTURE) IMPLANT
SYR CONTROL 10ML LL (SYRINGE) ×2 IMPLANT
TAPE FIBER 2MM 7IN #2 BLUE (SUTURE) ×2 IMPLANT
TOWEL OR 17X24 6PK STRL BLUE (TOWEL DISPOSABLE) ×2 IMPLANT
TOWEL OR 17X26 10 PK STRL BLUE (TOWEL DISPOSABLE) ×2 IMPLANT
TUBE CONNECTING 12X1/4 (SUCTIONS) ×3 IMPLANT
TUBING ARTHROSCOPY IRRIG 16FT (MISCELLANEOUS) ×2 IMPLANT
WAND HAND CNTRL MULTIVAC 90 (MISCELLANEOUS) ×2 IMPLANT
WATER STERILE IRR 1000ML POUR (IV SOLUTION) ×2 IMPLANT

## 2015-11-07 NOTE — Anesthesia Procedure Notes (Signed)
Anesthesia Regional Block:  Interscalene brachial plexus block  Pre-Anesthetic Checklist: ,, timeout performed, Correct Patient, Correct Site, Correct Laterality, Correct Procedure, Correct Position, site marked, Risks and benefits discussed,  Surgical consent,  Pre-op evaluation,  At surgeon's request and post-op pain management  Laterality: Upper and Right  Prep: chloraprep       Needles:  Injection technique: Single-shot  Needle Type: Stimulator Needle - 40     Needle Length: 4cm 4 cm Needle Gauge: 22 and 22 G  Needle insertion depth: 3 cm   Additional Needles:  Procedures: ultrasound guided (picture in chart) and nerve stimulator Interscalene brachial plexus block  Nerve Stimulator or Paresthesia:  Response: Twitch elicited, 0.5 mA, 0.3 ms,   Additional Responses:   Narrative:  Start time: 11/07/2015 12:35 PM End time: 11/07/2015 12:45 PM Injection made incrementally with aspirations every 5 mL.  Performed by: Personally  Anesthesiologist: Francile Woolford  Additional Notes: Block assessed prior to start of surgery

## 2015-11-07 NOTE — Progress Notes (Signed)
Carelink Summary Report / Loop Recorder 

## 2015-11-07 NOTE — Anesthesia Procedure Notes (Signed)
Procedure Name: Intubation Date/Time: 11/07/2015 2:38 PM Performed by: Margaree MackintoshYACOUB, Armondo Cech B Pre-anesthesia Checklist: Patient identified, Emergency Drugs available, Suction available, Patient being monitored and Timeout performed Patient Re-evaluated:Patient Re-evaluated prior to inductionOxygen Delivery Method: Circle system utilized Preoxygenation: Pre-oxygenation with 100% oxygen Intubation Type: IV induction Ventilation: Two handed mask ventilation required and Oral airway inserted - appropriate to patient size Laryngoscope Size: Glidescope and 4 Grade View: Grade I Tube type: Oral Tube size: 7.5 mm Number of attempts: 1 Airway Equipment and Method: Video-laryngoscopy Placement Confirmation: ETT inserted through vocal cords under direct vision,  positive ETCO2 and breath sounds checked- equal and bilateral Secured at: 23 cm Tube secured with: Tape Dental Injury: Teeth and Oropharynx as per pre-operative assessment

## 2015-11-07 NOTE — H&P (Signed)
Curtis Barton is an 60 y.o. male.   Chief Complaint: R shoulder pain and weakness HPI: R shoulder pain and weakness s/p injury.  Massive RCT.  Past Medical History:  Diagnosis Date  . Arthritis   . Asthma    as child- no problem  . Atrial flutter ([place])    a. s/p ablation  . Difficult intubation    maybe 8 years years ago, no trouble since then  . Dysrhythmia   . Fatty liver   . Fuchs' uveitis syndrome    left eye  . Heart murmur   . History of kidney stones   . Hypertension   . Primary localized osteoarthritis of right knee   . Shortness of breath dyspnea    with exertion  . Sleep apnea    home sleep study shows some sleep apnea  . Stroke ([place])   . TIA (transient ischemic attack) 2010    Past Surgical History:  Procedure Laterality Date  . acle repair Right 2000   ACL repair  . ATRIAL ABLATION SURGERY  10/2013   Dr Caryl Comes  . ATRIAL FLUTTER ABLATION N/A 10/13/2013   Procedure: ATRIAL FLUTTER ABLATION;  Surgeon: Deboraha Sprang, MD;  Location: [facility] CATH LAB;  Service: Cardiovascular;  Laterality: N/A;  . BICEPS TENDON REPAIR    . CATARACT EXTRACTION W/ INTRAOCULAR LENS  IMPLANT, BILATERAL    . CYSTOSCOPY W/ URETERAL STENT PLACEMENT  1999  . EP IMPLANTABLE DEVICE N/A 10/12/2014   Procedure: Loop Recorder Insertion;  Surgeon: Deboraha Sprang, MD;  Location: [place] CV LAB;  Service: Cardiovascular;  Laterality: N/A;  . EYE SURGERY    . HERNIA REPAIR    . KIDNEY SURGERY Right   . KNEE CARTILAGE SURGERY Bilateral   . LEFT HEART CATHETERIZATION WITH CORONARY ANGIOGRAM N/A 05/17/2014   Procedure: LEFT HEART CATHETERIZATION WITH CORONARY ANGIOGRAM;  Surgeon: Jettie Booze, MD;  Location: [facility] CATH LAB;  Service: Cardiovascular;  Laterality: N/A;  . ROTATOR CUFF REPAIR Right 2005  . TONSILLECTOMY    . TOTAL HIP ARTHROPLASTY Right 12/02/2014   Procedure: TOTAL HIP ARTHROPLASTY;  Surgeon: Earlie Server, MD;  Location: [place];  Service: Orthopedics;  Laterality: Right;  . TOTAL  KNEE ARTHROPLASTY Right 11/26/2013   dr French Ana  . TOTAL KNEE ARTHROPLASTY Right 11/26/2013   Procedure: RIGHT TOTAL KNEE ARTHROPLASTY;  Surgeon: Yvette Rack., MD;  Location: [place];  Service: Orthopedics;  Laterality: Right;    Family History  Problem Relation Age of Onset  . Heart disease Mother   . Cancer Father   . Heart disease    . Hypertension    . Cancer     Social History:  reports that he quit smoking about 2 years ago. His smoking use included Cigarettes. He smoked 1.00 pack per day. He has never used smokeless tobacco. He reports that he drinks alcohol. He reports that he does not use drugs.  Allergies:  Allergies  Allergen Reactions  . Nucynta [Tapentadol] Itching  . Codeine Itching  . Hydrocodone Itching  . Oxycodone Itching    Medications Prior to Admission  Medication Sig Dispense Refill  . apixaban (ELIQUIS) 5 MG TABS tablet Take 1 tablet (5 mg total) by mouth 2 (two) times daily. 180 tablet 3  . atenolol (TENORMIN) 25 MG tablet Take one tablet by mouth twice daily as directed 180 tablet 3  . cyclobenzaprine (FLEXERIL) 10 MG tablet Take 10 mg by mouth 3 (three) times daily as needed  for muscle spasms.      Results for orders placed or performed during the hospital encounter of 11/07/15 (from the past 48 hour(s))  Comprehensive metabolic panel     Status: Abnormal   Collection Time: 11/07/15 10:48 AM  Result Value Ref Range   Sodium 139 135 - 145 mmol/L   Potassium 4.4 3.5 - 5.1 mmol/L   Chloride 110 101 - 111 mmol/L   CO2 25 22 - 32 mmol/L   Glucose, Bld 101 (H) 65 - 99 mg/dL   BUN 8 6 - 20 mg/dL   Creatinine, Ser 0.96 0.61 - 1.24 mg/dL   Calcium 8.9 8.9 - 10.3 mg/dL   Total Protein 6.4 (L) 6.5 - 8.1 g/dL   Albumin 3.5 3.5 - 5.0 g/dL   AST 20 15 - 41 U/L   ALT 17 17 - 63 U/L   Alkaline Phosphatase 57 38 - 126 U/L   Total Bilirubin 0.7 0.3 - 1.2 mg/dL   GFR calc non Af Amer >60 >60 mL/min   GFR calc Af Amer >60 >60 mL/min    Comment: (NOTE) The  eGFR has been calculated using the CKD EPI equation. This calculation has not been validated in all clinical situations. eGFR's persistently <60 mL/min signify possible Chronic Kidney Disease.    Anion gap 4 (L) 5 - 15  CBC     Status: None   Collection Time: 11/07/15 10:48 AM  Result Value Ref Range   WBC 5.8 4.0 - 10.5 K/uL   RBC 4.27 4.22 - 5.81 MIL/uL   Hemoglobin 13.8 13.0 - 17.0 g/dL   HCT 42.1 39.0 - 52.0 %   MCV 98.6 78.0 - 100.0 fL   MCH 32.3 26.0 - 34.0 pg   MCHC 32.8 30.0 - 36.0 g/dL   RDW 13.1 11.5 - 15.5 %   Platelets 199 150 - 400 K/uL   No results found.  Review of Systems  All other systems reviewed and are negative.   Blood pressure 138/88, pulse 65, temperature 97.5 F (36.4 C), temperature source Oral, resp. rate (!) 2, height 6' 3.5" (1.918 m), weight 124.7 kg (275 lb), SpO2 97 %. Physical Exam  Constitutional: He is oriented to person, place, and time. He appears well-developed and well-nourished.  HENT:  Head: Atraumatic.  Eyes: EOM are normal.  Cardiovascular: Intact distal pulses.   Respiratory: Effort normal.  Musculoskeletal:  R shoulder pain and weakness with RC testing.  Neurological: He is alert and oriented to person, place, and time.  Skin: Skin is warm and dry.  Psychiatric: He has a normal mood and affect.     Assessment/Plan R shoulder massive RCT Plan attempt at repair, possible superior capsular reconstruction. Risks / benefits of surgery discussed Consent on chart  NPO for OR Preop antibiotics   Nita Sells, MD 11/07/2015, 1:14 PM

## 2015-11-07 NOTE — Transfer of Care (Signed)
Immediate Anesthesia Transfer of Care Note  Patient: Curtis Barton  Procedure(s) Performed: Procedure(s): SHOULDER ARTHROSCOPY WITH ROTATOR CUFF REPAIR AND DEBRIDEMENT with removal of loose bodies (suture) (Right)  Patient Location: PACU  Anesthesia Type:GA combined with regional for post-op pain  Level of Consciousness: awake, alert  and oriented  Airway & Oxygen Therapy: Patient Spontanous Breathing and Patient connected to nasal cannula oxygen  Post-op Assessment: Report given to RN and Post -op Vital signs reviewed and stable  Post vital signs: Reviewed and stable  Last Vitals:  Vitals:   11/07/15 1250 11/07/15 1645  BP:  93/73  Pulse: 65   Resp: (!) 2   Temp:  36.4 C    Last Pain:  Vitals:   11/07/15 1049  TempSrc: Oral         Complications: No apparent anesthesia complications

## 2015-11-07 NOTE — Op Note (Signed)
Procedure(s): SHOULDER ARTHROSCOPY WITH ROTATOR CUFF REPAIR AND DEBRIDEMENT with removal of loose bodies (suture) Procedure Note  Curtis Barton male 60 y.o. 11/07/2015  Procedure(s) and Anesthesia Type: #1 right shoulder arthroscopic massive rotator cuff repair including supraspinatus and subscapularis #2 right shoulder biceps tenotomy and debridement #3 right shoulder arthroscopic removal loose bodies, suture totaling 10 cm     Surgeon(s) and Role:    * Curtis BroomJustin Ghislaine Harcum, MD - Primary     Surgeon: Curtis Barton,Curtis Barton   Assistants: Curtis Lackanielle Lalibert PA-C (Danielle was present and scrubbed throughout the procedure and was essential in positioning, assisting with the camera and instrumentation,, and closure)  Anesthesia: General endotracheal anesthesia with preoperative interscalene block given by the attending anesthesiologist    Procedure Detail  SHOULDER ARTHROSCOPY WITH ROTATOR CUFF REPAIR AND DEBRIDEMENT with removal of loose bodies (suture)  Estimated Blood Loss: Min         Drains: none  Blood Given: none         Specimens: none        Complications:  * No complications entered in OR log *         Disposition: PACU - hemodynamically stable.         Condition: stable    Procedure:   INDICATIONS FOR SURGERY: The patient is 60 y.o. male who has a history of prior rotator cuff repair with recent injury and subsequent significant increase in pain and dysfunction. MRI shows retracted subscapularis tear as well as retracted supraspinatus tear. Indicated for surgical treatment to try decrease pain and restore function. He understood risks benefits alternatives procedure and wished to go forward with surgery.  OPERATIVE FINDINGS: Examination under anesthesia: No stiffness or instability  DESCRIPTION OF PROCEDURE: The patient was identified in preoperative  holding area where I personally marked the operative site after  verifying site, side, and procedure with the  patient. An interscalene block was given by the attending anesthesiologist the holding area.  The patient was taken back to the operating room where general anesthesia was induced without complication and was placed in the beach-chair position with the back  elevated about 60 degrees and all extremities and head and neck carefully padded and  positioned.   The right upper extremity was then prepped and  draped in a standard sterile fashion. The appropriate time-out  procedure was carried out. The patient did receive IV antibiotics  within 30 minutes of incision.   A small posterior portal incision was made and the arthroscope was introduced into the joint. An anterior portal was then established above the subscapularis using needle localization. Small cannula was placed anteriorly. Diagnostic arthroscopy was then carried out.  He was immediately noted to have complete retracted tear of the subscapularis behind the level of the glenoid. The biceps tendon was subluxated medially. The humeral joint surfaces were intact without significant chondromalacia. Supraspinatus was torn and retracted.  Placed traction on the subscapularis demonstrated that this was easily mobilized back towards the tuberosity. Large cannula was placed anteriorly with a small cannula in a high lateral rotator interval position. Using a grasper to pull traction on the subscapularis it was freed from surrounding scar tissues and was mobilized to allow repair. The repair was then carried out placing 2 fiber tapes in inverted mattress configuration through the good tendinous portion with one inferior 1 superior. These were then advanced into 2 4.75 peek swivel lock anchors in the tuberosity after debriding down to bleeding bone. This afforded an anatomic repair of  the subscapularis without undue tension. With gentle external rotation the tendon moved nicely.  The arthroscope was then introduced into the subacromial space a standard  lateral portal was established with needle localization. Supraspinatus was then debrided back to healthy tendon. One suture was removed from the lateral edge of the tendon and 2 larger sutures were removed from the tuberosity anchors. The tuberosity was debrided down to bleeding bone. The remaining tendon had reasonable quality although is fairly thin. Pulling laterally and was able to bring it just over the junction of the articular surface and tuberosity. This did not take significant amount of tension. Therefore felt that repair was worth an effort. Therefore larger cannula was placed in a high lateral position to allow anchor placement. 2 fiber tapes replacement inverted mattress configuration through the supraspinatus with one anterior 1 posterior. These were then brought over into 2 additional 4.75 swivel lock anchors just lateral to the articular margin. This afforded a complete repair of the supraspinatus under minimal tension just beyond the articular margin.  The arthroscopic equipment was removed from the joint and the portals were closed with 3-0 nylon in an interrupted fashion. Sterile dressings were then applied including Xeroform 4 x 4's ABDs and tape. The patient was then allowed to awaken from general anesthesia, placed in a sling, transferred to the stretcher and taken to the recovery room in stable condition.   POSTOPERATIVE PLAN: The patient will be discharged home today and will followup in one week for suture removal and wound check.  We will follow massive cuff protocol.

## 2015-11-07 NOTE — Anesthesia Preprocedure Evaluation (Signed)
Anesthesia Evaluation  Patient identified by MRN, date of birth, ID band Patient awake    Reviewed: Allergy & Precautions, NPO status , Patient's Chart, lab work & pertinent test results  History of Anesthesia Complications (+) DIFFICULT AIRWAY  Airway Mallampati: III  TM Distance: <3 FB Neck ROM: Full    Dental  (+) Teeth Intact, Caps, Dental Advisory Given   Pulmonary shortness of breath, asthma , former smoker,    breath sounds clear to auscultation       Cardiovascular hypertension, + angina with exertion + dysrhythmias  Rhythm:Regular Rate:Normal     Neuro/Psych TIACVA    GI/Hepatic negative GI ROS, Neg liver ROS,   Endo/Other  Morbid obesity  Renal/GU negative Renal ROS     Musculoskeletal  (+) Arthritis ,   Abdominal (+) + obese,   Peds  Hematology   Anesthesia Other Findings   Reproductive/Obstetrics                             Anesthesia Physical Anesthesia Plan  ASA: III  Anesthesia Plan: General   Post-op Pain Management: GA combined w/ Regional for post-op pain   Induction:   Airway Management Planned: Video Laryngoscope Planned and Oral ETT  Additional Equipment:   Intra-op Plan:   Post-operative Plan: Extubation in OR  Informed Consent: I have reviewed the patients History and Physical, chart, labs and discussed the procedure including the risks, benefits and alternatives for the proposed anesthesia with the patient or authorized representative who has indicated his/her understanding and acceptance.   Dental advisory given  Plan Discussed with: CRNA  Anesthesia Plan Comments:         Anesthesia Quick Evaluation

## 2015-11-07 NOTE — Discharge Instructions (Signed)
Discharge Instructions after Arthroscopic Shoulder Repair   A sling has been provided for you. Remain in your sling at all times. This includes sleeping in your sling.  Use ice on the shoulder intermittently over the first 48 hours after surgery.  Pain medicine has been prescribed for you.  Use your medicine liberally over the first 48 hours, and then you can begin to taper your use. You may take Extra Strength Tylenol or Tylenol only in place of the pain pills. DO NOT take ANY nonsteroidal anti-inflammatory pain medications: Advil, Motrin, Ibuprofen, Aleve, Naproxen, or Narprosyn.  You may remove your dressing after two days. If the incision sites are still moist, place a Band-Aid over the moist site(s). Change Band-Aids daily until dry.  You may shower 5 days after surgery. The incisions CANNOT get wet prior to 5 days. Simply allow the water to wash over the site and then pat dry. Do not rub the incisions. Make sure your axilla (armpit) is completely dry after showering.     Please call [phone] during normal business hours or [phone] after hours for any problems. Including the following:  - excessive redness of the incisions - drainage for more than 4 days - fever of more than 101.5 F  *Please note that pain medications will not be refilled after hours or on weekends.    

## 2015-11-08 ENCOUNTER — Encounter (HOSPITAL_COMMUNITY): Payer: Self-pay | Admitting: Orthopedic Surgery

## 2015-11-08 DIAGNOSIS — M75121 Complete rotator cuff tear or rupture of right shoulder, not specified as traumatic: Secondary | ICD-10-CM | POA: Diagnosis not present

## 2015-11-08 NOTE — Progress Notes (Signed)
   PATIENT ID: Curtis Barton   1 Day Post-Op Procedure(s) (LRB): SHOULDER ARTHROSCOPY WITH ROTATOR CUFF REPAIR AND DEBRIDEMENT with removal of loose bodies (suture) (Right)  Subjective: Doing well. Shoulder is sore after block wore off. Comfortable in bed.   Objective:  Vitals:   11/08/15 0013 11/08/15 0452  BP: 107/67 109/68  Pulse: 62 64  Resp: 16 17  Temp: 98 F (36.7 C) 98 F (36.7 C)     R UE dressing c/d/i Wiggles fingers, distally NVI  Labs:   Recent Labs  11/07/15 1048  HGB 13.8   Recent Labs  11/07/15 1048  WBC 5.8  RBC 4.27  HCT 42.1  PLT 199   Recent Labs  11/07/15 1048  NA 139  K 4.4  CL 110  CO2 25  BUN 8  CREATININE 0.96  GLUCOSE 101*  CALCIUM 8.9    Assessment and Plan: 1 day s /p right RCR Sling D/c after noon Continue pain mgmt- d/c IV morphine Scripts in chart Fu with Dr. Ave Filterhandler in 1week  VTE proph: asa, scds

## 2015-11-08 NOTE — Plan of Care (Signed)
Problem: Activity: Goal: Ability to tolerate increased activity will improve Outcome: Progressing In and out of bed with assistance  Problem: Physical Regulation: Goal: Postoperative complications will be avoided or minimized Outcome: Progressing No S/S of DVT noted  Problem: Pain Management: Goal: Pain level will decrease with appropriate interventions Outcome: Progressing Pain well controlled with Oxycodone  Problem: Safety: Goal: Ability to remain free from injury will improve Outcome: Progressing Safety precautions maintained, no fall or injury  noted  Problem: Bowel/Gastric: Goal: Will not experience complications related to bowel motility Outcome: Progressing No bowel issues noted

## 2015-11-08 NOTE — Plan of Care (Signed)
Problem: Activity: Goal: Ability to tolerate increased activity will improve Outcome: Progressing Painful with movement

## 2015-11-08 NOTE — Progress Notes (Signed)
Review discharge papers and medications with full understanding 

## 2015-11-08 NOTE — Discharge Summary (Signed)
Patient ID: Curtis Barton MRN: [id number] DOB/AGE: Jul 15, 1955 60 y.o.  Admit date: 11/07/2015 Discharge date: 11/08/2015  Admission Diagnoses:  Active Problems:   S/P rotator cuff repair   Discharge Diagnoses:  Same  Past Medical History:  Diagnosis Date  . Arthritis   . Asthma    as child- no problem  . Atrial flutter (HCC)    a. s/p ablation  . Difficult intubation    maybe 8 years years ago, no trouble since then  . Dysrhythmia   . Fatty liver   . Fuchs' uveitis syndrome    left eye  . Heart murmur   . History of kidney stones   . Hypertension   . Primary localized osteoarthritis of right knee   . Shortness of breath dyspnea    with exertion  . Sleep apnea    home sleep study shows some sleep apnea  . Stroke (HCC)   . TIA (transient ischemic attack) 2010    Surgeries: Procedure(s): SHOULDER ARTHROSCOPY WITH ROTATOR CUFF REPAIR AND DEBRIDEMENT with removal of loose bodies (suture) on 11/07/2015   Consultants:   Discharged Condition: Improved  Hospital Course: Curtis Barton is an 60 y.o. male who was admitted 11/07/2015 for operative treatment of right shoulder massive rot cuff tear. Patient has severe unremitting pain that affects sleep, daily activities, and work/hobbies. After pre-op clearance the patient was taken to the operating room on 11/07/2015 and underwent  Procedure(s): SHOULDER ARTHROSCOPY WITH ROTATOR CUFF REPAIR AND DEBRIDEMENT with removal of loose bodies (suture).    Patient was given perioperative antibiotics: Anti-infectives    Start     Dose/Rate Route Frequency Ordered Stop   11/07/15 2030  ceFAZolin (ANCEF) IVPB 2g/100 mL premix     2 g 200 mL/hr over 30 Minutes Intravenous Every 6 hours 11/07/15 2025 11/08/15 1429   11/07/15 1200  ceFAZolin (ANCEF) 3 g in dextrose 5 % 50 mL IVPB     3 g 130 mL/hr over 30 Minutes Intravenous On call to O.R. 11/06/15 1356 11/07/15 1445       Patient was given sequential compression devices, early  ambulation, and asa to prevent DVT.  Patient benefited maximally from hospital stay and there were no complications.    Recent vital signs: Patient Vitals for the past 24 hrs:  BP Temp Temp src Pulse Resp SpO2 Height Weight  11/08/15 0452 109/68 98 F (36.7 C) Oral 64 17 92 % - -  11/08/15 0013 107/67 98 F (36.7 C) Oral 62 16 94 % - -  11/07/15 2057 136/82 97.5 F (36.4 C) Oral (!) 58 16 95 % - -  11/07/15 1944 - 97 F (36.1 C) - - - - - -  11/07/15 1932 121/85 - - (!) 57 14 96 % - -  11/07/15 1930 - - - (!) 57 17 (!) 83 % - -  11/07/15 1918 126/84 - - (!) 58 12 94 % - -  11/07/15 1915 - - - (!) 59 12 94 % - -  11/07/15 1903 130/79 - - (!) 56 14 94 % - -  11/07/15 1900 - - - (!) 57 13 95 % - -  11/07/15 1848 123/78 - - (!) 55 11 93 % - -  11/07/15 1845 - - - 60 15 97 % - -  11/07/15 1832 124/83 - - (!) 59 15 91 % - -  11/07/15 1830 - - - (!) 55 11 98 % - -  11/07/15 1820 123/75 - - Marland Kitchen(!)  56 12 93 % - -  11/07/15 1818 - - - (!) 49 (!) 9 97 % - -  11/07/15 1815 - - - (!) 48 11 99 % - -  11/07/15 1805 126/82 - - (!) 54 10 95 % - -  11/07/15 1803 - - - (!) 54 10 93 % - -  11/07/15 1800 - - - (!) 121 10 90 % - -  11/07/15 1750 123/79 - - (!) 54 14 95 % - -  11/07/15 1747 - - - (!) 54 10 98 % - -  11/07/15 1745 - - - (!) 53 13 100 % - -  11/07/15 1735 113/77 - - (!) 58 (!) 9 95 % - -  11/07/15 1733 - - - 61 12 95 % - -  11/07/15 1730 - - - (!) 57 12 98 % - -  11/07/15 1720 130/86 - - (!) 58 (!) 9 99 % - -  11/07/15 1717 - - - 66 11 96 % - -  11/07/15 1715 - - - 62 19 100 % - -  11/07/15 1705 - - - (!) 58 11 97 % - -  11/07/15 1700 121/77 - - 64 17 97 % - -  11/07/15 1645 93/73 97.5 F (36.4 C) - 66 13 95 % - -  11/07/15 1250 - - - 65 (!) 2 97 % - -  11/07/15 1245 - - - - 15 - - -  11/07/15 1240 - - - (!) 56 10 96 % - -  11/07/15 1235 - - - (!) 55 15 99 % - -  11/07/15 1049 138/88 97.5 F (36.4 C) Oral (!) 51 20 97 % 6' 3.5" (1.918 m) 124.7 kg (275 lb)     Recent laboratory  studies:  Recent Labs  11/07/15 1048  WBC 5.8  HGB 13.8  HCT 42.1  PLT 199  NA 139  K 4.4  CL 110  CO2 25  BUN 8  CREATININE 0.96  GLUCOSE 101*  CALCIUM 8.9     Discharge Medications:     Medication List    TAKE these medications   apixaban 5 MG Tabs tablet Commonly known as:  ELIQUIS Take 1 tablet (5 mg total) by mouth 2 (two) times daily.   atenolol 25 MG tablet Commonly known as:  TENORMIN Take one tablet by mouth twice daily as directed   cyclobenzaprine 10 MG tablet Commonly known as:  FLEXERIL Take 10 mg by mouth 3 (three) times daily as needed for muscle spasms.   docusate sodium 100 MG capsule Commonly known as:  COLACE Take 1 capsule (100 mg total) by mouth 3 (three) times daily as needed.   oxyCODONE-acetaminophen 5-325 MG tablet Commonly known as:  ROXICET Take 1-2 tablets by mouth every 4 (four) hours as needed for severe pain.       Diagnostic Studies: Mr Shoulder Right Wo Contrast  Result Date: 10/12/2015 CLINICAL DATA:  Shoulder pain since a fall 09/25/2015. EXAM: MRI OF THE RIGHT SHOULDER WITHOUT CONTRAST TECHNIQUE: Multiplanar, multisequence MR imaging of the shoulder was performed. No intravenous contrast was administered. COMPARISON:  None. FINDINGS: Rotator cuff: Prior rotator cuff repair. Complete tear of the supraspinatus tendon with 2.5 cm of retraction. Mild tendinosis of the infraspinatus tendon. Teres minor tendon is intact. Near complete tear of the subscapularis tendon with 2.8 cm of retraction of the torn fibers. Muscles: No atrophy or fatty replacement of nor abnormal signal within, the muscles of the  rotator cuff. Biceps long head:  Intact. Acromioclavicular Joint: Prior subacromial decompression. Type I acromion. Glenohumeral Joint: Small joint effusion.  No focal chondral defect. Labrum: Grossly intact, but evaluation is limited by lack of intraarticular fluid. Bones:  No focal signal abnormality.  No fracture or dislocation. Other:  None IMPRESSION: 1. Prior rotator cuff repair. Complete tear of the supraspinatus tendon with 2.5 cm of retraction. 2. Mild tendinosis of the infraspinatus tendon. 3. Near complete tear of the subscapularis tendon with 2.8 cm of retraction of the torn fibers. Electronically Signed   By: Elige Ko   On: 10/12/2015 14:52    Disposition: 06-Home-Health Care Svc  Discharge Instructions    Call MD / Call 911    Complete by:  As directed    If you experience chest pain or shortness of breath, CALL 911 and be transported to the hospital emergency room.  If you develope a fever above 101 F, pus (white drainage) or increased drainage or redness at the wound, or calf pain, call your surgeon's office.   Constipation Prevention    Complete by:  As directed    Drink plenty of fluids.  Prune juice may be helpful.  You may use a stool softener, such as Colace (over the counter) 100 mg twice a day.  Use MiraLax (over the counter) for constipation as needed.   Diet - low sodium heart healthy    Complete by:  As directed    Increase activity slowly as tolerated    Complete by:  As directed       Follow-up Information    Mable Paris, MD. Schedule an appointment as soon as possible for a visit in 1 week(s).   Specialty:  Orthopedic Surgery Contact information: [address] [place] [address] [phone]            Signed: Jiles Harold 11/08/2015, 8:20 AM

## 2015-11-09 NOTE — Telephone Encounter (Signed)
Great thanks for letting me know

## 2015-11-09 NOTE — Anesthesia Postprocedure Evaluation (Signed)
Anesthesia Post Note  Patient: Curtis Barton  Procedure(s) Performed: Procedure(s) (LRB): SHOULDER ARTHROSCOPY WITH ROTATOR CUFF REPAIR AND DEBRIDEMENT with removal of loose bodies (suture) (Right)  Patient location during evaluation: PACU Anesthesia Type: General and Regional Level of consciousness: awake and alert Pain management: pain level controlled Vital Signs Assessment: post-procedure vital signs reviewed and stable Respiratory status: spontaneous breathing, nonlabored ventilation, respiratory function stable and patient connected to nasal cannula oxygen Cardiovascular status: blood pressure returned to baseline and stable Postop Assessment: no signs of nausea or vomiting Anesthetic complications: no    Last Vitals:  Vitals:   11/08/15 0956 11/08/15 1200  BP: 124/76 106/65  Pulse: 64 (!) 58  Resp:    Temp:  37 C    Last Pain:  Vitals:   11/08/15 1200  TempSrc: Oral  PainSc:                  Alyan Hartline,JAMES TERRILL

## 2015-11-16 ENCOUNTER — Encounter: Payer: BLUE CROSS/BLUE SHIELD | Admitting: Internal Medicine

## 2015-11-21 ENCOUNTER — Encounter: Payer: Self-pay | Admitting: Internal Medicine

## 2015-11-21 ENCOUNTER — Ambulatory Visit (INDEPENDENT_AMBULATORY_CARE_PROVIDER_SITE_OTHER): Payer: BLUE CROSS/BLUE SHIELD | Admitting: Internal Medicine

## 2015-11-21 VITALS — BP 124/79 | HR 64 | Ht 76.0 in | Wt 274.0 lb

## 2015-11-21 DIAGNOSIS — I471 Supraventricular tachycardia: Secondary | ICD-10-CM

## 2015-11-21 DIAGNOSIS — I483 Typical atrial flutter: Secondary | ICD-10-CM

## 2015-11-21 LAB — CUP PACEART INCLINIC DEVICE CHECK: Date Time Interrogation Session: 20171010164645

## 2015-11-21 NOTE — Progress Notes (Signed)
his hip replacement      Patient Care Team: Kaleen Mask, MD as PCP - General (Family Medicine)   HPI  Curtis Barton is a 60 y.o. male Seen in followup for atrial flutter for which he underwent catheter ablation while in hospital 9/15 Also with palps s/p ILR .  Echocardiogram 12/16 at that time demonstrated normal left ventricular function.  CHADS-VASc score was 3 with hypertension and presumed TIAs    Hip  replacement surgery 8/16 ; biceps replacement surgery 12/16. He is struggling with the implications of ongoing medical illnesses.  Over the last couple of months, he has noted significant change in his exercise capacity manifested by dyspnea on exertion and a midsternal-shoulder chest discomfort. These both relieved with rest and/or recurrent with repeated exertion  LHC 4/16 >>50% LAD. He continues to struggle with dyspnea  Echo demonstrated Large R  Side suggestive of R>L shunt and he has undergone MRI No clear explanation for this has been forthcoming; he is on CPAP for sleep apnea x months   ILR insertion 8/16 for presyncope and tachypalpitations;     Cardiac risk factors include hypertension, vascular disease, and former smoking;  .             Past Medical History:  Diagnosis Date  . Arthritis   . Asthma    as child- no problem  . Atrial flutter (HCC)    a. s/p ablation  . Difficult intubation    maybe 8 years years ago, no trouble since then  . Dysrhythmia   . Fatty liver   . Fuchs' uveitis syndrome    left eye  . Heart murmur   . History of kidney stones   . Hypertension   . Primary localized osteoarthritis of right knee   . Shortness of breath dyspnea    with exertion  . Sleep apnea    home sleep study shows some sleep apnea  . Stroke (HCC)   . TIA (transient ischemic attack) 2010    Past Surgical History:  Procedure Laterality Date  . acle repair Right 2000   ACL repair  . ATRIAL ABLATION SURGERY  10/2013   Dr Graciela Husbands  . ATRIAL  FLUTTER ABLATION N/A 10/13/2013   Procedure: ATRIAL FLUTTER ABLATION;  Surgeon: Duke Salvia, MD;  Location: [facility] C[facility] LAB;  Service: Cardiovascular;  Laterality: N/A;  . BICEPS TENDON REPAIR    . CATARACT EXTRACTION W/ INTRAOCULAR LENS  IMPLANT, BILATERAL    . CYSTOSCOPY W/ URETERAL STENT PLACEMENT  1999  . EP IMPLANTABLE DEVICE N/A 10/12/2014   Procedure: Loop Recorder Insertion;  Surgeon: Duke Salvia, MD;  Location: [facility] INVASIVE CV LAB;  Service: Cardiovascular;  Laterality: N/A;  . EYE SURGERY    . HERNIA REPAIR    . KIDNEY SURGERY Right   . KNEE CARTILAGE SURGERY Bilateral   . LEFT HEART CATHETERIZATION WITH CORONARY ANGIOGRAM N/A 05/17/2014   Procedure: LEFT HEART CATHETERIZATION WITH CORONARY ANGIOGRAM;  Surgeon: Corky Crafts, MD;  Location: [facility] CATH LAB;  Service: Cardiovascular;  Laterality: N/A;  . ROTATOR CUFF REPAIR Right 2005  . SHOULDER ARTHROSCOPY WITH ROTATOR CUFF REPAIR Right 11/07/2015   Procedure: SHOULDER ARTHROSCOPY WITH ROTATOR CUFF REPAIR AND DEBRIDEMENT with removal of loose bodies (suture);  Surgeon: Jones Broom, MD;  Location: [facility] OR;  Service: Orthopedics;  Laterality: Right;  . TONSILLECTOMY    . TOTAL HIP ARTHROPLASTY Right 12/02/2014   Procedure: TOTAL HIP ARTHROPLASTY;  Surgeon: Frederico Hamman, MD;  Location:  [facility] OR;  Service: Orthopedics;  Laterality: Right;  . TOTAL KNEE ARTHROPLASTY Right 11/26/2013   dr Madelon Lipscaffrey  . TOTAL KNEE ARTHROPLASTY Right 11/26/2013   Procedure: RIGHT TOTAL KNEE ARTHROPLASTY;  Surgeon: Thera FlakeW D Caffrey Jr., MD;  Location: [facility] OR;  Service: Orthopedics;  Laterality: Right;    Current Outpatient Prescriptions  Medication Sig Dispense Refill  . apixaban (ELIQUIS) 5 MG TABS tablet Take 1 tablet (5 mg total) by mouth 2 (two) times daily. 180 tablet 3  . atenolol (TENORMIN) 25 MG tablet Take 25 mg by mouth daily.    . cyclobenzaprine (FLEXERIL) 10 MG tablet Take 10 mg by mouth 3 (three) times daily as needed for muscle spasms.    Marland Kitchen.  docusate sodium (COLACE) 100 MG capsule Take 1 capsule (100 mg total) by mouth 3 (three) times daily as needed. 20 capsule 0  . oxyCODONE-acetaminophen (ROXICET) 5-325 MG tablet Take 1-2 tablets by mouth every 4 (four) hours as needed for severe pain. 60 tablet 0   No current facility-administered medications for this visit.     Allergies  Allergen Reactions  . Nucynta [Tapentadol] Itching  . Codeine Itching  . Hydrocodone Itching  . Oxycodone Itching    Review of Systems negative except from HPI and PMH  Physical Exam BP 124/79   Pulse 64   Ht 6\' 4"  (1.93 m)   Wt 274 lb (124.3 kg)   SpO2 98%   BMI 33.35 kg/m  Well developed and well nourished in no acute distress HENT normal E scleral and icterus clear Neck Supple JVP flat; carotids brisk and full Clear to ausculation  Regular rate and rhythm, no murmurs gallops or rub Soft with active bowel sounds No clubbing cyanosis  Edema Alert and oriented, grossly normal motor and sensory function Skin Warm and Dry   ECG was ordered today demonstrating sinus rhythm at 65 Intervals 17/09/41 Otherwise normal   Assessment and  Plan  Atrial flutter status post ablation   Palpitations    S/p Loop recorder  Chest pain /Dyspnea  Hypertension  Sleep disordered breathing   Atrial tachycardia-nonsustained  TIA--MRI neg    Blood pressure is well-controlled no evidence of volume overload  The issue of dyspnea remains perplexing. I will think in terms of cardiopulmonary stress testing at his next evaluation for right now following shoulder surgery that would not be appropriate  Continues w palpitations,   Chest pains are unlikely cardiac

## 2015-11-21 NOTE — Patient Instructions (Signed)
Medication Instructions: - Your physician recommends that you continue on your current medications as directed. Please refer to the Current Medication list given to you today.  Labwork: - none ordered  Procedures/Testing: - none ordered  Follow-Up: - Your physician wants you to follow-up in: 6 months with Gypsy BalsamAmber Seiler, NP and 1 year with Dr. Graciela HusbandsKlein. You will receive a reminder letter in the mail two months in advance. If you don't receive a letter, please call our office to schedule the follow-up appointment.  Any Additional Special Instructions Will Be Listed Below (If Applicable).     If you need a refill on your cardiac medications before your next appointment, please call your pharmacy.

## 2015-12-06 ENCOUNTER — Ambulatory Visit (INDEPENDENT_AMBULATORY_CARE_PROVIDER_SITE_OTHER): Payer: BLUE CROSS/BLUE SHIELD | Admitting: *Deleted

## 2015-12-06 DIAGNOSIS — R55 Syncope and collapse: Secondary | ICD-10-CM

## 2015-12-07 NOTE — Progress Notes (Signed)
Carelink Summary Report / Loop Recorder 

## 2015-12-09 LAB — CUP PACEART REMOTE DEVICE CHECK: MDC IDC SESS DTM: 20170926003729

## 2015-12-09 NOTE — Progress Notes (Signed)
Carelink summary report received. Battery status OK. Normal device function. No new symptom episodes, tachy episodes, or brady episodes. No new AF episodes. 1 pause episodes- previously reviewed. Monthly summary reports and ROV/PRN

## 2015-12-11 ENCOUNTER — Encounter: Payer: Self-pay | Admitting: Internal Medicine

## 2015-12-13 NOTE — Telephone Encounter (Signed)
Johnny BridgeMartha GeorgiaPA pager [phone] for Dr. Blane OharaGiegengock needs pt to be off Eliquis for two days prior to eye surgery 12-28-15-pls fax to [phone]

## 2015-12-14 ENCOUNTER — Encounter: Payer: Self-pay | Admitting: *Deleted

## 2015-12-14 NOTE — Telephone Encounter (Signed)
Letter faxed to [phone]. Confirmation received.

## 2016-01-06 LAB — CUP PACEART REMOTE DEVICE CHECK
MDC IDC PG IMPLANT DT: 20160831
MDC IDC SESS DTM: 20171026023617

## 2016-01-06 NOTE — Progress Notes (Signed)
Carelink summary report received. Battery status OK. Normal device function. No new symptom episodes, tachy episodes, brady, or pause episodes. No new AF episodes. Monthly summary reports and ROV/PRN 

## 2016-01-08 ENCOUNTER — Ambulatory Visit (INDEPENDENT_AMBULATORY_CARE_PROVIDER_SITE_OTHER): Payer: BLUE CROSS/BLUE SHIELD | Admitting: *Deleted

## 2016-01-08 DIAGNOSIS — R55 Syncope and collapse: Secondary | ICD-10-CM | POA: Diagnosis not present

## 2016-01-09 NOTE — Progress Notes (Signed)
Carelink Summary Report / Loop Recorder 

## 2016-02-06 ENCOUNTER — Ambulatory Visit (INDEPENDENT_AMBULATORY_CARE_PROVIDER_SITE_OTHER): Payer: BLUE CROSS/BLUE SHIELD | Admitting: *Deleted

## 2016-02-06 DIAGNOSIS — R55 Syncope and collapse: Secondary | ICD-10-CM

## 2016-02-07 NOTE — Progress Notes (Signed)
Carelink Summary Report / Loop Recorder 

## 2016-02-17 LAB — CUP PACEART REMOTE DEVICE CHECK
Implantable Pulse Generator Implant Date: 20160831
MDC IDC SESS DTM: 20171125033644

## 2016-02-17 NOTE — Progress Notes (Signed)
Carelink summary report received. Battery status OK. Normal device function. No new symptom episodes, tachy episodes, brady, or pause episodes. No new AF episodes. Monthly summary reports and ROV/PRN 

## 2016-03-03 ENCOUNTER — Encounter: Payer: Self-pay | Admitting: Internal Medicine

## 2016-03-05 ENCOUNTER — Ambulatory Visit (INDEPENDENT_AMBULATORY_CARE_PROVIDER_SITE_OTHER): Payer: BLUE CROSS/BLUE SHIELD | Admitting: *Deleted

## 2016-03-05 DIAGNOSIS — R55 Syncope and collapse: Secondary | ICD-10-CM

## 2016-03-06 NOTE — Progress Notes (Signed)
Carelink Summary Report / Loop Recorder 

## 2016-03-25 LAB — CUP PACEART REMOTE DEVICE CHECK
Implantable Pulse Generator Implant Date: 20160831
MDC IDC SESS DTM: 20171225043712

## 2016-03-28 ENCOUNTER — Telehealth: Payer: Self-pay | Admitting: *Deleted

## 2016-03-28 ENCOUNTER — Encounter: Payer: Self-pay | Admitting: Internal Medicine

## 2016-03-28 NOTE — Telephone Encounter (Signed)
Spoke with patient regarding symptom episode from 03/24/16.  Partial ECG received, but unable to see tachy episode that is suggested by rate plot.  Spoke with patient and requested that he send a manual Carelink transmission for review.  He will send a transmission this evening for review of full symptom episode.  Patient reports that his symptoms were that he felt like his "heart stopped, then beat real fast for a period of time, then stopped again" with some "hard" heartbeats.  He denies syncope with this episode.  Advised that we will review episode with MD for further recommendations after manual transmission is received.  Patient verbalizes understanding and denies additional questions or concerns at this time.  Patient scheduled for 5079m f/u appointment with Curtis BalsamAmber Seiler, NP (from recall) for 05/29/16 at 9:00am.

## 2016-03-29 NOTE — Telephone Encounter (Signed)
Called pt back and let him know transmission was received and his heart rate sped up for a few beats then returned to normal, informed pt that episode would be placed in Dr. Koren BoundKleins folder for review and if he had any further recommendations he would receive a call back. Pt voiced understanding and appreciative of call back.

## 2016-04-04 ENCOUNTER — Ambulatory Visit (INDEPENDENT_AMBULATORY_CARE_PROVIDER_SITE_OTHER): Payer: BLUE CROSS/BLUE SHIELD | Admitting: *Deleted

## 2016-04-04 DIAGNOSIS — R55 Syncope and collapse: Secondary | ICD-10-CM | POA: Diagnosis not present

## 2016-04-05 NOTE — Progress Notes (Signed)
Carelink Summary Report / Loop Recorder 

## 2016-04-07 LAB — CUP PACEART REMOTE DEVICE CHECK
Date Time Interrogation Session: 20180124050841
MDC IDC PG IMPLANT DT: 20160831

## 2016-04-26 LAB — CUP PACEART REMOTE DEVICE CHECK
Implantable Pulse Generator Implant Date: 20160831
MDC IDC SESS DTM: 20180223050532

## 2016-05-06 ENCOUNTER — Ambulatory Visit (INDEPENDENT_AMBULATORY_CARE_PROVIDER_SITE_OTHER): Payer: BLUE CROSS/BLUE SHIELD | Admitting: *Deleted

## 2016-05-06 DIAGNOSIS — R55 Syncope and collapse: Secondary | ICD-10-CM

## 2016-05-07 NOTE — Progress Notes (Signed)
Carelink Summary Report / Loop Recorder 

## 2016-05-18 LAB — CUP PACEART REMOTE DEVICE CHECK
Date Time Interrogation Session: 20180325054517
Implantable Pulse Generator Implant Date: 20160831

## 2016-05-27 NOTE — Progress Notes (Signed)
Electrophysiology Office Note Date: 05/29/2016  ID:  Curtis, Barton January 25, 1956, MRN [id number]  PCP: Kaleen Mask, MD Electrophysiologist: Graciela Husbands  CC: follow up on atrial tachycardia  Curtis Barton is a 61 y.o. male seen today for Dr Graciela Husbands.  He underwent flutter ablation in 10/2013 with subsequent ILR implant for palpitations and pre-syncope.  He has documented atrial tachycardia but no arrhythmias correlating with pre-syncope.  He reports compliance with CPAP.  Dr Odessa Fleming last note suggests CPX to further evaluate shortness of breath. His shortness of breath has improved recently with CPAP compliance.  He continues to have intermittent palpitations that are short lived and self terminating.  He is also having trouble with erectile dysfunction that is improved with Viagra.  He has had rare isolated episodes of chest tightness that occur with rest and last for a minute or two. They are not associated with any other symptoms and do not occur with exertion. 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 He denies dyspnea, PND, orthopnea, nausea, vomiting, dizziness, syncope, edema, weight gain, or early satiety.   Past Medical History:  Diagnosis Date  . Arthritis   . Asthma    as child- no problem  . Atrial flutter (HCC)    a. s/p ablation  . Difficult intubation    maybe 8 years years ago, no trouble since then  . Fatty liver   . Fuchs' uveitis syndrome    left eye  . History of kidney stones   . Hypertension   . Primary localized osteoarthritis of right knee   . Sleep apnea    home sleep study shows some sleep apnea  . Stroke (HCC)   . TIA (transient ischemic attack) 2010   Past Surgical History:  Procedure Laterality Date  . acle repair Right 2000   ACL repair  . ATRIAL ABLATION SURGERY  10/2013   Dr Graciela Husbands  . ATRIAL FLUTTER ABLATION Curtis Barton 10/13/2013   Procedure: ATRIAL FLUTTER ABLATION;  Surgeon: Duke Salvia, MD;   Location: [facility] CATH LAB;  Service: Cardiovascular;  Laterality: Curtis Barton;  . BICEPS TENDON REPAIR    . CATARACT EXTRACTION W/ INTRAOCULAR LENS  IMPLANT, BILATERAL    . CYSTOSCOPY W/ URETERAL STENT PLACEMENT  1999  . EP IMPLANTABLE DEVICE Curtis Barton 10/12/2014   Procedure: Loop Recorder Insertion;  Surgeon: Duke Salvia, MD;  Location: [facility] INVASIVE CV LAB;  Service: Cardiovascular;  Laterality: Curtis Barton;  . EYE SURGERY    . HERNIA REPAIR    . KIDNEY SURGERY Right   . KNEE CARTILAGE SURGERY Bilateral   . LEFT HEART CATHETERIZATION WITH CORONARY ANGIOGRAM Curtis Barton 05/17/2014   Procedure: LEFT HEART CATHETERIZATION WITH CORONARY ANGIOGRAM;  Surgeon: Corky Crafts, MD;  Location: [facility] CATH LAB;  Service: Cardiovascular;  Laterality: Curtis Barton;  . ROTATOR CUFF REPAIR Right 2005  . SHOULDER ARTHROSCOPY WITH ROTATOR CUFF REPAIR Right 11/07/2015   Procedure: SHOULDER ARTHROSCOPY WITH ROTATOR CUFF REPAIR AND DEBRIDEMENT with removal of loose bodies (suture);  Surgeon: Jones Broom, MD;  Location: [facility] OR;  Service: Orthopedics;  Laterality: Right;  . TONSILLECTOMY    . TOTAL HIP ARTHROPLASTY Right 12/02/2014   Procedure: TOTAL HIP ARTHROPLASTY;  Surgeon: Frederico Hamman, MD;  Location: [facility] OR;  Service: Orthopedics;  Laterality: Right;  . TOTAL KNEE ARTHROPLASTY Right 11/26/2013   dr Madelon Lips  . TOTAL KNEE ARTHROPLASTY Right 11/26/2013   Procedure: RIGHT TOTAL KNEE ARTHROPLASTY;  Surgeon: Thera Flake., MD;  Location: [facility] OR;  Service: Orthopedics;  Laterality: Right;    Current Outpatient Prescriptions  Medication Sig Dispense Refill  . apixaban (ELIQUIS) 5 MG TABS tablet Take 1 tablet (5 mg total) by mouth 2 (two) times daily. 180 tablet 3  . clindamycin (CLEOCIN) 150 MG capsule Take 150 mg by mouth 4 (four) times daily. For 10 days  0  . cyclobenzaprine (FLEXERIL) 10 MG tablet Take 10 mg by mouth 3 (three) times daily as needed for muscle spasms.    Marland Kitchen docusate sodium (COLACE) 100 MG capsule Take 1 capsule (100 mg total) by  mouth 3 (three) times daily as needed. 20 capsule 0  . moxifloxacin (VIGAMOX) 0.5 % ophthalmic solution Place 1 drop into the left eye 2 (two) times daily.    Marland Kitchen oxyCODONE-acetaminophen (ROXICET) 5-325 MG tablet Take 1-2 tablets by mouth every 4 (four) hours as needed for severe pain. 60 tablet 0  . diltiazem (CARDIZEM CD) 120 MG 24 hr capsule Take 1 capsule (120 mg total) by mouth daily. 30 capsule 11   No current facility-administered medications for this visit.     Allergies:   Nucynta [tapentadol]; Codeine; Hydrocodone; and Oxycodone   Social History: Social History   Social History  . Marital status: Married    Spouse name: Curtis Barton  . Number of children: Curtis Barton  . Years of education: Curtis Barton   Occupational History  . Not on file.   Social History Main Topics  . Smoking status: Former Smoker    Packs/day: 1.00    Types: Cigarettes    Quit date: 10/12/2013  . Smokeless tobacco: Never Used  . Alcohol use Yes     Comment: ocassional  . Drug use: No  . Sexual activity: Not on file   Other Topics Concern  . Not on file   Social History Narrative  . No narrative on file    Family History: Family History  Problem Relation Age of Onset  . Heart disease Mother   . Cancer Father   . Heart disease    . Hypertension    . Cancer      Review of Systems: All other systems reviewed and are otherwise negative except as noted above.   Physical Exam: VS:  BP 126/88   Pulse 60   Ht 6' 3.5" (1.918 m)   Wt 289 lb 6.4 oz (131.3 kg)   BMI 35.70 kg/m  , BMI Body mass index is 35.7 kg/m. Wt Readings from Last 3 Encounters:  05/29/16 289 lb 6.4 oz (131.3 kg)  11/21/15 274 lb (124.3 kg)  11/07/15 275 lb (124.7 kg)    GEN- The patient is obese appearing, alert and oriented x 3 today.   HEENT: normocephalic, atraumatic; sclera clear, conjunctiva pink; hearing intact; oropharynx clear; neck supple Lungs- Clear to ausculation bilaterally, normal work of breathing.  No wheezes, rales,  rhonchi Heart- Regular rate and rhythm GI- soft, non-tender, non-distended, bowel sounds present Extremities- no clubbing, cyanosis, no edema MS- no significant deformity or atrophy Skin- warm and dry, no rash or lesion  Psych- euthymic mood, full affect Neuro- strength and sensation are intact   EKG:  EKG is not ordered today.  Recent Labs: 09/05/2015: Magnesium 2.1 11/07/2015: ALT 17; BUN 8; Creatinine, Ser 0.96; Hemoglobin 13.8; Platelets 199; Potassium 4.4; Sodium 139    Other studies Reviewed: Additional studies/ records that were reviewed today include: Dr Odessa Fleming office notes  Assessment and Plan: 1.  Atrial arrhythmias The patient presented with atrial flutter  and is s/p ablation.  He has had no documented recurrent atrial flutter.   He continues to have very short runs of AT/atrial fibrillation on loop recorder interrogation that are intermittently symptomatic. Continue Eliquis for CHADS2VASC of 3  2.  OSA Continued compliance with CPAP encouraged   3.  Obesity Body mass index is 35.7 kg/m. Weight loss encouraged  4.  HTN Stable He is having erectile dysfunction with Atenolol. Will change to Cardizem CD  daily. I have asked him to check his blood pressure at home to monitor with med change.   5.  Shortness of breath Improved Will hold off on CPX   6.  Chest pain I do not think this is cardiac with his level of physical activity and no pain or shortness of breath with exertion   Current medicines are reviewed at length with the patient today.   The patient does not have concerns regarding his medicines.  The following changes were made today:  Stop Atenolol, start Cardizem CD  daily  Labs/ tests ordered today include:none No orders of the defined types were placed in this encounter.    Disposition:   Follow up with Dr Graciela Husbands in 1 year, Carelink    Signed, Gypsy Balsam, NP 05/29/2016 9:41 AM   [facility] [address] [place] [address] [phone] (office) [phone] (fax)

## 2016-05-29 ENCOUNTER — Ambulatory Visit (INDEPENDENT_AMBULATORY_CARE_PROVIDER_SITE_OTHER): Payer: BLUE CROSS/BLUE SHIELD | Admitting: Nurse Practitioner

## 2016-05-29 ENCOUNTER — Encounter: Payer: Self-pay | Admitting: Nurse Practitioner

## 2016-05-29 VITALS — BP 126/88 | HR 60 | Ht 75.5 in | Wt 289.4 lb

## 2016-05-29 DIAGNOSIS — I10 Essential (primary) hypertension: Secondary | ICD-10-CM

## 2016-05-29 DIAGNOSIS — R0602 Shortness of breath: Secondary | ICD-10-CM | POA: Diagnosis not present

## 2016-05-29 DIAGNOSIS — Z9989 Dependence on other enabling machines and devices: Secondary | ICD-10-CM | POA: Diagnosis not present

## 2016-05-29 DIAGNOSIS — I471 Supraventricular tachycardia: Secondary | ICD-10-CM

## 2016-05-29 DIAGNOSIS — I483 Typical atrial flutter: Secondary | ICD-10-CM | POA: Diagnosis not present

## 2016-05-29 DIAGNOSIS — G4733 Obstructive sleep apnea (adult) (pediatric): Secondary | ICD-10-CM | POA: Diagnosis not present

## 2016-05-29 MED ORDER — DILTIAZEM HCL ER COATED BEADS 120 MG PO CP24: 120.0000 mg | ORAL_CAPSULE | Freq: Every day | ORAL | 11 refills | Status: DC

## 2016-05-29 NOTE — Patient Instructions (Signed)
Medication Instructions:  Your physician has recommended you make the following change in your medication: Pleas stop the Atenolol. Please start taking the Diltiazem 120 mg daily. We will send this to your pharmacy.   Labwork: None Ordered  Testing/Procedures: None Ordered   Follow-Up: Your physician wants you to follow-up in: 1 year with Dr. Graciela Husbands. You will receive a reminder letter in the mail two months in advance. If you don't receive a letter, please call our office to schedule the follow-up appointment.   Any Other Special Instructions Will Be Listed Below (If Applicable).     If you need a refill on your cardiac medications before your next appointment, please call your pharmacy.

## 2016-06-04 ENCOUNTER — Ambulatory Visit (INDEPENDENT_AMBULATORY_CARE_PROVIDER_SITE_OTHER): Payer: BLUE CROSS/BLUE SHIELD | Admitting: *Deleted

## 2016-06-04 DIAGNOSIS — R55 Syncope and collapse: Secondary | ICD-10-CM

## 2016-06-04 NOTE — Progress Notes (Signed)
Carelink Summary Report / Loop Recorder 

## 2016-06-21 LAB — CUP PACEART REMOTE DEVICE CHECK
Date Time Interrogation Session: 20180424060855
Implantable Pulse Generator Implant Date: 20160831

## 2016-07-04 ENCOUNTER — Ambulatory Visit (INDEPENDENT_AMBULATORY_CARE_PROVIDER_SITE_OTHER): Payer: BLUE CROSS/BLUE SHIELD | Admitting: *Deleted

## 2016-07-04 ENCOUNTER — Encounter: Payer: Self-pay | Admitting: Nurse Practitioner

## 2016-07-04 DIAGNOSIS — R55 Syncope and collapse: Secondary | ICD-10-CM | POA: Diagnosis not present

## 2016-07-04 NOTE — Progress Notes (Signed)
Carelink Summary Report / Loop Recorder 

## 2016-07-05 LAB — CUP PACEART REMOTE DEVICE CHECK
Implantable Pulse Generator Implant Date: 20160831
MDC IDC SESS DTM: 20180524060722

## 2016-07-05 MED ORDER — APIXABAN 5 MG PO TABS: 5.0000 mg | ORAL_TABLET | Freq: Two times a day (BID) | ORAL | 1 refills | Status: DC

## 2016-08-05 ENCOUNTER — Ambulatory Visit (INDEPENDENT_AMBULATORY_CARE_PROVIDER_SITE_OTHER): Payer: BLUE CROSS/BLUE SHIELD | Admitting: *Deleted

## 2016-08-05 DIAGNOSIS — R55 Syncope and collapse: Secondary | ICD-10-CM

## 2016-08-05 NOTE — Progress Notes (Signed)
Carelink Summary Report / Loop Recorder 

## 2016-08-15 LAB — CUP PACEART REMOTE DEVICE CHECK
Date Time Interrogation Session: 20180623065002
MDC IDC PG IMPLANT DT: 20160831

## 2016-08-15 NOTE — Progress Notes (Signed)
Carelink summary report received. Battery status OK. Normal device function. No new symptom episodes, tachy episodes, brady, or pause episodes. No new AF episodes. Monthly summary reports and ROV/PRN 

## 2016-09-02 ENCOUNTER — Ambulatory Visit (INDEPENDENT_AMBULATORY_CARE_PROVIDER_SITE_OTHER): Payer: BLUE CROSS/BLUE SHIELD | Admitting: *Deleted

## 2016-09-02 DIAGNOSIS — R55 Syncope and collapse: Secondary | ICD-10-CM | POA: Diagnosis not present

## 2016-09-03 NOTE — Progress Notes (Signed)
Carelink Summary Report / Loop Recorder 

## 2016-09-15 LAB — CUP PACEART REMOTE DEVICE CHECK
Implantable Pulse Generator Implant Date: 20160831
MDC IDC SESS DTM: 20180723121352

## 2016-09-15 NOTE — Progress Notes (Signed)
Carelink summary report received. Battery status OK. Normal device function. No new symptom episodes, tachy episodes, brady, or pause episodes. No new AF episodes. Monthly summary reports and ROV/PRN 

## 2016-10-02 ENCOUNTER — Ambulatory Visit (INDEPENDENT_AMBULATORY_CARE_PROVIDER_SITE_OTHER): Payer: BLUE CROSS/BLUE SHIELD | Admitting: *Deleted

## 2016-10-02 DIAGNOSIS — R55 Syncope and collapse: Secondary | ICD-10-CM

## 2016-10-02 NOTE — Progress Notes (Signed)
Carelink Summary Report / Loop Recorder 

## 2016-10-08 LAB — CUP PACEART REMOTE DEVICE CHECK
Date Time Interrogation Session: 20180822121257
Implantable Pulse Generator Implant Date: 20160831

## 2016-10-14 ENCOUNTER — Encounter: Payer: Self-pay | Admitting: Internal Medicine

## 2016-10-16 NOTE — Telephone Encounter (Signed)
Spoke with Mr. Joelene MillinOliver- he is out today and he can send a transmission tonight when he gets home. We will review the report and call him back tomorrow afternoon. He says that he will be working tomorrow and won't be available until mid-afternoon 10/17/16. He is Adult nurseappreciative.

## 2016-10-17 ENCOUNTER — Encounter: Payer: BLUE CROSS/BLUE SHIELD | Admitting: Internal Medicine

## 2016-10-17 NOTE — Telephone Encounter (Signed)
Report received via Carelink. ECG appears to be ST with rare ectopy (PACs) and some artifact. Followed by Dr. Tresa EndoKelly as needed for sleep apnea. 2016 cath showed 50% LAD stenosis.  Spoke with Dr. Graciela HusbandsKlein- he recommends in-office follow up with general cardiology APP this week. Will take to scheduling.

## 2016-10-17 NOTE — Telephone Encounter (Signed)
LMOM (ok per DPR) explaining recommendations and that a scheduler will be reaching out this afternoon for an appointment.

## 2016-10-23 ENCOUNTER — Encounter: Payer: Self-pay | Admitting: Internal Medicine

## 2016-10-23 ENCOUNTER — Ambulatory Visit (INDEPENDENT_AMBULATORY_CARE_PROVIDER_SITE_OTHER): Payer: BLUE CROSS/BLUE SHIELD | Admitting: Physician Assistant

## 2016-10-23 ENCOUNTER — Encounter: Payer: Self-pay | Admitting: Physician Assistant

## 2016-10-23 VITALS — BP 132/82 | HR 67 | Ht 76.0 in | Wt 286.0 lb

## 2016-10-23 DIAGNOSIS — R079 Chest pain, unspecified: Secondary | ICD-10-CM | POA: Diagnosis not present

## 2016-10-23 DIAGNOSIS — R0609 Other forms of dyspnea: Secondary | ICD-10-CM | POA: Diagnosis not present

## 2016-10-23 DIAGNOSIS — G4733 Obstructive sleep apnea (adult) (pediatric): Secondary | ICD-10-CM | POA: Diagnosis not present

## 2016-10-23 DIAGNOSIS — Z8679 Personal history of other diseases of the circulatory system: Secondary | ICD-10-CM | POA: Diagnosis not present

## 2016-10-23 DIAGNOSIS — Z9889 Other specified postprocedural states: Secondary | ICD-10-CM | POA: Diagnosis not present

## 2016-10-23 DIAGNOSIS — I10 Essential (primary) hypertension: Secondary | ICD-10-CM

## 2016-10-23 DIAGNOSIS — I358 Other nonrheumatic aortic valve disorders: Secondary | ICD-10-CM | POA: Diagnosis not present

## 2016-10-23 DIAGNOSIS — R0600 Dyspnea, unspecified: Secondary | ICD-10-CM

## 2016-10-23 DIAGNOSIS — M79603 Pain in arm, unspecified: Secondary | ICD-10-CM | POA: Diagnosis not present

## 2016-10-23 DIAGNOSIS — R002 Palpitations: Secondary | ICD-10-CM

## 2016-10-23 NOTE — Patient Instructions (Signed)
Medication Instructions:  Your physician recommends that you continue on your current medications as directed. Please refer to the Current Medication list given to you today.  Labwork: NONE  Testing/Procedures: Your physician has requested that you have en exercise stress myoview. For further information please visit [email or web address]. Please follow instruction sheet, as given.  Your physician has requested that you have an echocardiogram. Echocardiography is a painless test that uses sound waves to create images of your heart. It provides your doctor with information about the size and shape of your heart and how well your heart's chambers and valves are working. This procedure takes approximately one hour. There are no restrictions for this procedure.  Follow-Up: Your physician recommends that you schedule a follow-up appointment in: 1 MONTH with Azalee CourseHao Meng PA   Any Other Special Instructions Will Be Listed Below (If Applicable).     If you need a refill on your cardiac medications before your next appointment, please call your pharmacy.

## 2016-10-23 NOTE — Progress Notes (Signed)
Cardiology Office Note    Date:  10/25/2016   ID:  Curtis Barton [birth year], MRN [id number]  PCP:  Kaleen Mask, MD  Cardiologist:  Dr. Graciela Husbands   Chief Complaint  Patient presents with  . Follow-up    seen for Dr. Graciela Husbands    History of Present Illness:  Curtis Barton is a 61 y.o. male with PMH of atrial flutter s/p ablation 10/2013, Fatty liver disease, hypertension, OSA and TIA. He has subsequently ILR implantation for presyncope and palpitation after his atrial flutter ablation. He has documented atrial tachycardia but no arrhythmia correlating with presyncope. His last cardiac catheterization was performed on 05/17/2014 which showed EF 60%, focal LAD stenosis.  Patient is complaining several things during today's office visit. He says he has been noticing some irregular tachypalpitation feeling followed by a prolonged pulse. He actually have discussed this with Dr. Graciela Husbands in the past did not see anything on his loop recorder. We did interrogate the loop recorder again, it shows occasional PACs, however no prolonged sinus pause afterward. Otherwise he has been having occasional arm pain as well it is accompanied by increasing dyspnea on exertion and weakness. I recommended a treadmill nuclear stress test. On exam, he has 1 to 2/6 systolic murmur at the aortic valve area, I suspect he has mild to moderate aortic stenosis. I plan to repeat echocardiogram to assess. I will see him back in one month for reassessment of his symptom.   Past Medical History:  Diagnosis Date  . Arthritis   . Asthma    as child- no problem  . Atrial flutter (HCC)    a. s/p ablation  . Difficult intubation    maybe 8 years years ago, no trouble since then  . Fatty liver   . Fuchs' uveitis syndrome    left eye  . History of kidney stones   . Hypertension   . Primary localized osteoarthritis of right knee   . Sleep apnea    home sleep study shows some sleep apnea  . Stroke (HCC)   . TIA  (transient ischemic attack) 2010    Past Surgical History:  Procedure Laterality Date  . acle repair Right 2000   ACL repair  . ATRIAL ABLATION SURGERY  10/2013   Dr Graciela Husbands  . ATRIAL FLUTTER ABLATION N/A 10/13/2013   Procedure: ATRIAL FLUTTER ABLATION;  Surgeon: Duke Salvia, MD;  Location: [facility] CATH LAB;  Service: Cardiovascular;  Laterality: N/A;  . BICEPS TENDON REPAIR    . CATARACT EXTRACTION W/ INTRAOCULAR LENS  IMPLANT, BILATERAL    . CYSTOSCOPY W/ URETERAL STENT PLACEMENT  1999  . EP IMPLANTABLE DEVICE N/A 10/12/2014   Procedure: Loop Recorder Insertion;  Surgeon: Duke Salvia, MD;  Location: [facility] INVASIVE CV LAB;  Service: Cardiovascular;  Laterality: N/A;  . EYE SURGERY    . HERNIA REPAIR    . KIDNEY SURGERY Right   . KNEE CARTILAGE SURGERY Bilateral   . LEFT HEART CATHETERIZATION WITH CORONARY ANGIOGRAM N/A 05/17/2014   Procedure: LEFT HEART CATHETERIZATION WITH CORONARY ANGIOGRAM;  Surgeon: Corky Crafts, MD;  Location: [facility] CATH LAB;  Service: Cardiovascular;  Laterality: N/A;  . ROTATOR CUFF REPAIR Right 2005  . SHOULDER ARTHROSCOPY WITH ROTATOR CUFF REPAIR Right 11/07/2015   Procedure: SHOULDER ARTHROSCOPY WITH ROTATOR CUFF REPAIR AND DEBRIDEMENT with removal of loose bodies (suture);  Surgeon: Jones Broom, MD;  Location: [facility] OR;  Service: Orthopedics;  Laterality: Right;  . TONSILLECTOMY    .  TOTAL HIP ARTHROPLASTY Right 12/02/2014   Procedure: TOTAL HIP ARTHROPLASTY;  Surgeon: Frederico Hammananiel Caffrey, MD;  [facility];  Service: Orthopedics;  Laterality: Right;  . TOTAL KNEE ARTHROPLASTY Right 11/26/2013   dr Madelon Lipscaffrey  . TOTAL KNEE ARTHROPLASTY Right 11/26/2013   Procedure: RIGHT TOTAL KNEE ARTHROPLASTY;  Surgeon: Thera FlakeW D Caffrey Jr., MD;  Location: [facility] OR;  Service: Orthopedics;  Laterality: Right;    Current Medications: Outpatient Medications Prior to Visit  Medication Sig Dispense Refill  . apixaban (ELIQUIS) 5 MG TABS tablet Take 1 tablet (5 mg total) by mouth 2 (two)  times daily. 180 tablet 1  . cyclobenzaprine (FLEXERIL) 10 MG tablet Take 10 mg by mouth 3 (three) times daily as needed for muscle spasms.    Marland Kitchen. diltiazem (CARDIZEM CD) 120 MG 24 hr capsule Take 1 capsule (120 mg total) by mouth daily. 30 capsule 11  . docusate sodium (COLACE) 100 MG capsule Take 1 capsule (100 mg total) by mouth 3 (three) times daily as needed. 20 capsule 0  . moxifloxacin (VIGAMOX) 0.5 % ophthalmic solution Place 1 drop into the left eye 2 (two) times daily.    . clindamycin (CLEOCIN) 150 MG capsule Take 150 mg by mouth 4 (four) times daily. For 10 days  0  . oxyCODONE-acetaminophen (ROXICET) 5-325 MG tablet Take 1-2 tablets by mouth every 4 (four) hours as needed for severe pain. 60 tablet 0   No facility-administered medications prior to visit.      Allergies:   Nucynta [tapentadol]; Codeine; Hydrocodone; and Oxycodone   Social History   Social History  . Marital status: Married    Spouse name: N/A  . Number of children: N/A  . Years of education: N/A   Social History Main Topics  . Smoking status: Former Smoker    Packs/day: 1.00    Types: Cigarettes    Quit date: 10/12/2013  . Smokeless tobacco: Never Used  . Alcohol use Yes     Comment: ocassional  . Drug use: No  . Sexual activity: Not Asked   Other Topics Concern  . None   Social History Narrative  . None     Family History:  The patient's family history includes Cancer in his father and unknown relative; Heart disease in his mother and unknown relative; Hypertension in his unknown relative.   ROS:   Please see the history of present illness.    ROS All other systems reviewed and are negative.   PHYSICAL EXAM:   VS:  BP 132/82   Pulse 67   Ht 6\' 4"  (1.93 m)   Wt 286 lb (129.7 kg)   BMI 34.81 kg/m    GEN: Well nourished, well developed, in no acute distress  HEENT: normal  Neck: no JVD, carotid bruits, or masses Cardiac: RRR; no murmurs, rubs, or gallops,no edema  Respiratory:  clear to  auscultation bilaterally, normal work of breathing GI: soft, nontender, nondistended, + BS MS: no deformity or atrophy  Skin: warm and dry, no rash Neuro:  Alert and Oriented x 3, Strength and sensation are intact Psych: euthymic mood, full affect  Wt Readings from Last 3 Encounters:  10/23/16 286 lb (129.7 kg)  05/29/16 289 lb 6.4 oz (131.3 kg)  11/21/15 274 lb (124.3 kg)      Studies/Labs Reviewed:   EKG:  EKG is ordered today.  The ekg ordered today demonstrates Normal sinus rhythm with no significant ST-T wave changes  Recent Labs: 11/07/2015: ALT 17; BUN 8; Creatinine,  Ser 0.96; Hemoglobin 13.8; Platelets 199; Potassium 4.4; Sodium 139   Lipid Panel    Component Value Date/Time   CHOL 190 05/16/2014 1022   TRIG 144.0 05/16/2014 1022   HDL 40.30 05/16/2014 1022   CHOLHDL 5 05/16/2014 1022   VLDL 28.8 05/16/2014 1022   LDLCALC 121 (H) 05/16/2014 1022   LDLDIRECT 140.0 05/16/2014 1022    Additional studies/ records that were reviewed today include:   Cath 05/17/2014 IMPRESSIONS:  1. Mild to moderate focal coronary artery disease in the distal  left anterior descending artery and mid right coronary artery, as described above.  No apparent hemodynamically significant disease  2. Normal left ventricular systolic function.  LVEDP 14 mmHg.  Ejection fraction 60%. 3.   No abdominal aortic aneurysm. No renal artery stenosis.   RECOMMENDATION:   Continue aggressive medical therapy and preventive therapy for hypertension and coronary artery disease.  Hopefully, his anginal symptoms will improve with better blood pressure control.   ASSESSMENT:    1. DOE (dyspnea on exertion)   2. Chest pain, unspecified type   3. Pain of upper extremity, unspecified laterality   4. Aortic heart murmur   5. Palpitation   6. S/P ablation of atrial flutter   7. Essential hypertension   8. OSA (obstructive sleep apnea)      PLAN:  In order of problems listed above:  1. Dyspnea on  exertion along with arm pain: Exertional component is quite concerning, I plan to obtain outpatient treadmill stress test.  2. Tachycardia palpitation: We interrogated his his loop recorder today, this did not show significant prolonged pauses. It does show occasional PAC and PVCs, but there was no prolonged pause afterward.  3. Atrial flutter s/p ablation: No obvious sign of recurrence based on today's loop recorder interrogation  4. Heart murmur: Likely aortic stenosis based on physical exam. Plan to obtain echocardiogram as outpatient  5. Hypertension: Blood pressure stable on current medication   Medication Adjustments/Labs and Tests Ordered: Current medicines are reviewed at length with the patient today.  Concerns regarding medicines are outlined above.  Medication changes, Labs and Tests ordered today are listed in the Patient Instructions below. Patient Instructions  Medication Instructions:  Your physician recommends that you continue on your current medications as directed. Please refer to the Current Medication list given to you today.  Labwork: NONE  Testing/Procedures: Your physician has requested that you have en exercise stress myoview. For further information please visit [email or web address] Please follow instruction sheet, as given.  Your physician has requested that you have an echocardiogram. Echocardiography is a painless test that uses sound waves to create images of your heart. It provides your doctor with information about the size and shape of your heart and how well your heart's chambers and valves are working. This procedure takes approximately one hour. There are no restrictions for this procedure.  Follow-Up: Your physician recommends that you schedule a follow-up appointment in: 1 MONTH with Azalee Course PA   Any Other Special Instructions Will Be Listed Below (If Applicable).     If you need a refill on your cardiac medications before your next  appointment, please call your pharmacy.      Ramond Dial, [place]  10/25/2016 6:12 AM    [facility] [address] [place], [place], [place]  [address] Phone: [phone]; Fax: [phone]

## 2016-10-25 ENCOUNTER — Encounter: Payer: Self-pay | Admitting: Physician Assistant

## 2016-11-01 ENCOUNTER — Ambulatory Visit (INDEPENDENT_AMBULATORY_CARE_PROVIDER_SITE_OTHER): Payer: BLUE CROSS/BLUE SHIELD | Admitting: *Deleted

## 2016-11-01 DIAGNOSIS — R55 Syncope and collapse: Secondary | ICD-10-CM | POA: Diagnosis not present

## 2016-11-01 NOTE — Progress Notes (Signed)
Carelink Summary Report / Loop Recorder 

## 2016-11-04 LAB — CUP PACEART REMOTE DEVICE CHECK
Implantable Pulse Generator Implant Date: 20160831
MDC IDC SESS DTM: 20180921123852

## 2016-11-05 ENCOUNTER — Telehealth (HOSPITAL_COMMUNITY): Payer: Self-pay

## 2016-11-05 ENCOUNTER — Other Ambulatory Visit: Payer: Self-pay

## 2016-11-05 ENCOUNTER — Ambulatory Visit (HOSPITAL_COMMUNITY): Payer: BLUE CROSS/BLUE SHIELD | Attending: Cardiology

## 2016-11-05 VITALS — BP 131/84

## 2016-11-05 DIAGNOSIS — I071 Rheumatic tricuspid insufficiency: Secondary | ICD-10-CM | POA: Diagnosis not present

## 2016-11-05 DIAGNOSIS — R079 Chest pain, unspecified: Secondary | ICD-10-CM | POA: Diagnosis not present

## 2016-11-05 DIAGNOSIS — I358 Other nonrheumatic aortic valve disorders: Secondary | ICD-10-CM

## 2016-11-05 DIAGNOSIS — R011 Cardiac murmur, unspecified: Secondary | ICD-10-CM | POA: Insufficient documentation

## 2016-11-05 DIAGNOSIS — R0600 Dyspnea, unspecified: Secondary | ICD-10-CM

## 2016-11-05 DIAGNOSIS — R0609 Other forms of dyspnea: Secondary | ICD-10-CM | POA: Diagnosis not present

## 2016-11-05 MED ORDER — PERFLUTREN LIPID MICROSPHERE
1.0000 mL | INTRAVENOUS | Status: AC | PRN
  Administered 2016-11-05: 1 mL via INTRAVENOUS

## 2016-11-05 NOTE — Telephone Encounter (Signed)
Encounter complete. 

## 2016-11-07 ENCOUNTER — Ambulatory Visit (HOSPITAL_COMMUNITY)
Admission: RE | Admit: 2016-11-07 | Discharge: 2016-11-07 | Disposition: A | Discharge status: Home / Self Care | Payer: BLUE CROSS/BLUE SHIELD | Source: Ambulatory Visit | Attending: Cardiology | Admitting: Cardiology

## 2016-11-07 DIAGNOSIS — R0609 Other forms of dyspnea: Secondary | ICD-10-CM | POA: Insufficient documentation

## 2016-11-07 DIAGNOSIS — M79603 Pain in arm, unspecified: Secondary | ICD-10-CM

## 2016-11-07 DIAGNOSIS — Z8249 Family history of ischemic heart disease and other diseases of the circulatory system: Secondary | ICD-10-CM | POA: Diagnosis not present

## 2016-11-07 DIAGNOSIS — R079 Chest pain, unspecified: Secondary | ICD-10-CM | POA: Diagnosis not present

## 2016-11-07 DIAGNOSIS — I10 Essential (primary) hypertension: Secondary | ICD-10-CM | POA: Diagnosis not present

## 2016-11-07 DIAGNOSIS — Z87891 Personal history of nicotine dependence: Secondary | ICD-10-CM | POA: Insufficient documentation

## 2016-11-07 DIAGNOSIS — I2510 Atherosclerotic heart disease of native coronary artery without angina pectoris: Secondary | ICD-10-CM | POA: Insufficient documentation

## 2016-11-07 DIAGNOSIS — Z8673 Personal history of transient ischemic attack (TIA), and cerebral infarction without residual deficits: Secondary | ICD-10-CM | POA: Insufficient documentation

## 2016-11-07 DIAGNOSIS — J45909 Unspecified asthma, uncomplicated: Secondary | ICD-10-CM | POA: Diagnosis not present

## 2016-11-07 DIAGNOSIS — I4892 Unspecified atrial flutter: Secondary | ICD-10-CM | POA: Diagnosis not present

## 2016-11-07 DIAGNOSIS — G4733 Obstructive sleep apnea (adult) (pediatric): Secondary | ICD-10-CM | POA: Insufficient documentation

## 2016-11-07 DIAGNOSIS — R0600 Dyspnea, unspecified: Secondary | ICD-10-CM

## 2016-11-07 DIAGNOSIS — E669 Obesity, unspecified: Secondary | ICD-10-CM | POA: Insufficient documentation

## 2016-11-07 MED ORDER — TECHNETIUM TC 99M TETROFOSMIN IV KIT
31.5000 | PACK | Freq: Once | INTRAVENOUS | Status: AC | PRN
  Administered 2016-11-07: 31.5 via INTRAVENOUS
  Filled 2016-11-07: qty 32

## 2016-11-08 ENCOUNTER — Ambulatory Visit (HOSPITAL_COMMUNITY)
Admission: RE | Admit: 2016-11-08 | Discharge: 2016-11-08 | Disposition: A | Discharge status: Home / Self Care | Payer: BLUE CROSS/BLUE SHIELD | Source: Ambulatory Visit | Attending: Cardiovascular Disease | Admitting: Cardiovascular Disease

## 2016-11-08 LAB — MYOCARDIAL PERFUSION IMAGING
CHL CUP MPHR: 142 {beats}/min
CHL CUP NUCLEAR SRS: 1
CHL CUP NUCLEAR SSS: 2
CSEPED: 5 min
CSEPEW: 6.3 METS
CSEPHR: 88 %
CSEPPHR: 142 {beats}/min
Exercise duration (sec): 31 s
LV dias vol: 96 mL (ref 62–150)
LV sys vol: 36 mL
NUC STRESS TID: 0.86
RPE: 18
Rest HR: 64 {beats}/min
SDS: 1

## 2016-11-08 MED ORDER — TECHNETIUM TC 99M TETROFOSMIN IV KIT
29.7000 | PACK | Freq: Once | INTRAVENOUS | Status: AC | PRN
  Administered 2016-11-08: 29.7 via INTRAVENOUS

## 2016-12-02 ENCOUNTER — Ambulatory Visit (INDEPENDENT_AMBULATORY_CARE_PROVIDER_SITE_OTHER): Payer: BLUE CROSS/BLUE SHIELD | Admitting: *Deleted

## 2016-12-02 DIAGNOSIS — R55 Syncope and collapse: Secondary | ICD-10-CM | POA: Diagnosis not present

## 2016-12-02 LAB — CUP PACEART REMOTE DEVICE CHECK
Implantable Pulse Generator Implant Date: 20160831
MDC IDC SESS DTM: 20181021123856

## 2016-12-02 NOTE — Progress Notes (Signed)
Carelink Summary Report / Loop Recorder 

## 2016-12-06 ENCOUNTER — Ambulatory Visit (INDEPENDENT_AMBULATORY_CARE_PROVIDER_SITE_OTHER): Payer: BLUE CROSS/BLUE SHIELD | Admitting: Physician Assistant

## 2016-12-06 ENCOUNTER — Encounter: Payer: Self-pay | Admitting: Physician Assistant

## 2016-12-06 VITALS — BP 128/85 | HR 63 | Ht 76.0 in | Wt 285.4 lb

## 2016-12-06 DIAGNOSIS — Z8679 Personal history of other diseases of the circulatory system: Secondary | ICD-10-CM | POA: Diagnosis not present

## 2016-12-06 DIAGNOSIS — Z9889 Other specified postprocedural states: Secondary | ICD-10-CM

## 2016-12-06 DIAGNOSIS — I10 Essential (primary) hypertension: Secondary | ICD-10-CM | POA: Diagnosis not present

## 2016-12-06 DIAGNOSIS — R002 Palpitations: Secondary | ICD-10-CM | POA: Diagnosis not present

## 2016-12-06 DIAGNOSIS — R079 Chest pain, unspecified: Secondary | ICD-10-CM

## 2016-12-06 DIAGNOSIS — I2510 Atherosclerotic heart disease of native coronary artery without angina pectoris: Secondary | ICD-10-CM

## 2016-12-06 MED ORDER — ISOSORBIDE MONONITRATE ER 30 MG PO TB24: 30.0000 mg | ORAL_TABLET | Freq: Every day | ORAL | 3 refills | Status: DC

## 2016-12-06 NOTE — Patient Instructions (Signed)
Azalee CourseHao Meng, PA-C has recommended making the following medication changes: 1. STOP Diltiazem 2. START Isosorbide Mononitrate 30 mg - take 1 tablet by mouth daily  Your physician recommends that you schedule a follow-up appointment in 6 months with Dr Graciela HusbandsKlein.  If you need a refill on your cardiac medications before your next appointment, please call your pharmacy.

## 2016-12-06 NOTE — Progress Notes (Signed)
Cardiology Office Note    Date:  12/08/2016   ID:  Curtis Barton, Curtis Barton 12/17/1955, MRN [id number]  PCP:  Kaleen Mask, MD  Cardiologist:   Dr. Graciela Husbands    Chief Complaint  Patient presents with  . Follow-up    seen for Dr. Graciela Husbands    History of Present Illness:  Curtis Barton is a 61 y.o. male with PMH of atrial flutter s/p ablation 10/2013, fatty liver disease, hypertension, OSA and TIA. He has subsequently ILR implantation for presyncope and palpitation after his atrial flutter ablation. He has documented atrial tachycardia but no arrhythmia correlating with presyncope. His last cardiac catheterization was performed on 05/17/2014 which showed EF 60%, focal LAD stenosis.  I last saw the patient on 10/23/2016, he was complaining of some irregular tachycardia palpitation feeling followed by prolonged pause. He has discussed this with Dr. Graciela Husbands in the past, however did not see anything on his loop recorder. We interrogated his Loop recorder again, showed occasional PACs however no prolonged sinus pause afterward. He was also complaining of some arm pain and increased dyspnea with exertion and weakness. On exam he had a heart murmur near the aortic valve area. Echocardiogram obtained on 11/05/2016 showed EF 60-65%, grade 1 DD. Myoview obtained on 10/31/2013 showed EF 62%, upsloping ST segment depression of 1 mm during stress, however normal perfusion study. Last Loop recorder interrogation was done 3 days ago on 12/02/2016, no new symptom identified, no significant tachycardia episode, bradycardia episode or pulse. No new atrial fibrillation episode.  Since the last time I saw him, he continued to have intermittent chest discomfort. It occurs both at rest and with exertion, interestingly, he was able to walk on the treadmill during the stress test without any chest pain. No obvious exacerbating or relieving factors. I wanted to do a trial of medical therapy was 30 mg daily of Imdur. His recent  Myoview showed normal perfusion, given the atypical nature of his chest discomfort, I wished to hold off on additional workup. He also have some leg cramps at night, this does not occur with ambulation, I do not think this is peripheral arterial disease. He has very good posterior tibial and dorsalis pedis pulse on physical exam. He continued to have palpitation feeling, however recent loop recorder has not shown any tachycardia or bradycardia palpitation or any prolonged sinus pulse. In fact he has not been taking his diltiazem in the recent month, I have removed his diltiazem for his medication list. He can follow-up with Dr. Graciela Husbands in 6 month, earlier if he become more symptomatic with chest pain.    Past Medical History:  Diagnosis Date  . Arthritis   . Asthma    as child- no problem  . Atrial flutter (HCC)    a. s/p ablation  . Difficult intubation    maybe 8 years years ago, no trouble since then  . Fatty liver   . Fuchs' uveitis syndrome    left eye  . History of kidney stones   . Hypertension   . Primary localized osteoarthritis of right knee   . Sleep apnea    home sleep study shows some sleep apnea  . Stroke (HCC)   . TIA (transient ischemic attack) 2010    Past Surgical History:  Procedure Laterality Date  . acle repair Right 2000   ACL repair  . ATRIAL ABLATION SURGERY  10/2013   Dr Graciela Husbands  . ATRIAL FLUTTER ABLATION N/A 10/13/2013   Procedure: ATRIAL  FLUTTER ABLATION;  Surgeon: Duke Salvia, MD;  Location: [facility] CATH LAB;  Service: Cardiovascular;  Laterality: N/A;  . BICEPS TENDON REPAIR    . CATARACT EXTRACTION W/ INTRAOCULAR LENS  IMPLANT, BILATERAL    . CYSTOSCOPY W/ URETERAL STENT PLACEMENT  1999  . EP IMPLANTABLE DEVICE N/A 10/12/2014   Procedure: Loop Recorder Insertion;  Surgeon: Duke Salvia, MD;  Location: [facility] INVASIVE CV LAB;  Service: Cardiovascular;  Laterality: N/A;  . EYE SURGERY    . HERNIA REPAIR    . KIDNEY SURGERY Right   . KNEE CARTILAGE SURGERY  Bilateral   . LEFT HEART CATHETERIZATION WITH CORONARY ANGIOGRAM N/A 05/17/2014   Procedure: LEFT HEART CATHETERIZATION WITH CORONARY ANGIOGRAM;  Surgeon: Corky Crafts, MD;  Location: [facility] CATH LAB;  Service: Cardiovascular;  Laterality: N/A;  . ROTATOR CUFF REPAIR Right 2005  . SHOULDER ARTHROSCOPY WITH ROTATOR CUFF REPAIR Right 11/07/2015   Procedure: SHOULDER ARTHROSCOPY WITH ROTATOR CUFF REPAIR AND DEBRIDEMENT with removal of loose bodies (suture);  Surgeon: Jones Broom, MD;  Location: [facility] OR;  Service: Orthopedics;  Laterality: Right;  . TONSILLECTOMY    . TOTAL HIP ARTHROPLASTY Right 12/02/2014   Procedure: TOTAL HIP ARTHROPLASTY;  Surgeon: Frederico Hamman, MD;  Location: [facility] OR;  Service: Orthopedics;  Laterality: Right;  . TOTAL KNEE ARTHROPLASTY Right 11/26/2013   dr Madelon Lips  . TOTAL KNEE ARTHROPLASTY Right 11/26/2013   Procedure: RIGHT TOTAL KNEE ARTHROPLASTY;  Surgeon: Thera Flake., MD;  Location: [facility] OR;  Service: Orthopedics;  Laterality: Right;    Current Medications: Outpatient Medications Prior to Visit  Medication Sig Dispense Refill  . apixaban (ELIQUIS) 5 MG TABS tablet Take 1 tablet (5 mg total) by mouth 2 (two) times daily. 180 tablet 1  . cyclobenzaprine (FLEXERIL) 10 MG tablet Take 10 mg by mouth 3 (three) times daily as needed for muscle spasms.    Marland Kitchen docusate sodium (COLACE) 100 MG capsule Take 1 capsule (100 mg total) by mouth 3 (three) times daily as needed. 20 capsule 0  . lisinopril (PRINIVIL,ZESTRIL) 20 MG tablet Take 1 tablet by mouth daily.    Marland Kitchen moxifloxacin (VIGAMOX) 0.5 % ophthalmic solution Place 1 drop into the left eye 2 (two) times daily.    Marland Kitchen diltiazem (CARDIZEM CD) 120 MG 24 hr capsule Take 1 capsule (120 mg total) by mouth daily. 30 capsule 11   No facility-administered medications prior to visit.      Allergies:   Nucynta [tapentadol]; Codeine; Hydrocodone; and Oxycodone   Social History   Social History  . Marital status: Married     Spouse name: N/A  . Number of children: N/A  . Years of education: N/A   Social History Main Topics  . Smoking status: Former Smoker    Packs/day: 1.00    Types: Cigarettes    Quit date: 10/12/2013  . Smokeless tobacco: Never Used  . Alcohol use Yes     Comment: ocassional  . Drug use: No  . Sexual activity: Not Asked   Other Topics Concern  . None   Social History Narrative  . None     Family History:  The patient's family history includes Cancer in his father and unknown relative; Heart disease in his mother and unknown relative; Hypertension in his unknown relative.   ROS:   Please see the history of present illness.    ROS All other systems reviewed and are negative.   PHYSICAL EXAM:   VS:  BP 128/85  Pulse 63   Ht 6\' 4"  (1.93 m)   Wt 285 lb 6.4 oz (129.5 kg)   BMI 34.74 kg/m    GEN: Well nourished, well developed, in no acute distress  HEENT: normal  Neck: no JVD, carotid bruits, or masses Cardiac: RRR; no murmurs, rubs, or gallops Trace edema  Respiratory:  clear to auscultation bilaterally, normal work of breathing GI: soft, nontender, nondistended, + BS MS: no deformity or atrophy  Skin: warm and dry, no rash Neuro:  Alert and Oriented x 3, Strength and sensation are intact Psych: euthymic mood, full affect  Wt Readings from Last 3 Encounters:  12/06/16 285 lb 6.4 oz (129.5 kg)  11/07/16 286 lb (129.7 kg)  10/23/16 286 lb (129.7 kg)      Studies/Labs Reviewed:   EKG:  EKG is ordered today.  The ekg ordered today demonstrates Normal sinus rhythm without significant ST-T wave changes.  Recent Labs: No results found for requested labs within last 8760 hours.   Lipid Panel    Component Value Date/Time   CHOL 190 05/16/2014 1022   TRIG 144.0 05/16/2014 1022   HDL 40.30 05/16/2014 1022   CHOLHDL 5 05/16/2014 1022   VLDL 28.8 05/16/2014 1022   LDLCALC 121 (H) 05/16/2014 1022   LDLDIRECT 140.0 05/16/2014 1022    Additional studies/ records that  were reviewed today include:   Cath 05/17/2014 IMPRESSIONS:  1. Mild to moderate focal coronary artery disease in the distal  left anterior descending artery and mid right coronary artery, as described above.  No apparent hemodynamically significant disease  2. Normal left ventricular systolic function.  LVEDP 14 mmHg.  Ejection fraction 60%. 3.   No abdominal aortic aneurysm. No renal artery stenosis.      Echo 11/05/2016  - Left ventricle: Wall thickness was increased in a pattern of mild   LVH. Systolic function was normal. The estimated ejection   fraction was in the range of 60% to 65%. Doppler parameters are   consistent with abnormal left ventricular relaxation (grade 1   diastolic dysfunction). - Aortic valve: There was trivial regurgitation. - Right ventricle: The cavity size was mildly dilated. - Tricuspid valve: There was moderate regurgitation.  Impressions:  - Compared to the prior study, there has been no significant   interval change.     Myoview 11/08/2016 Study Highlights     The left ventricular ejection fraction is normal (55-65%).  Nuclear stress EF: 62%.  Upsloping ST segment depression ST segment depression of 1 mm was noted during stress.  The study is normal.   Low risk stress nuclear study with normal perfusion and normal left ventricular regional and global systolic function.      ASSESSMENT:    1. Chest pain, unspecified type   2. S/P ablation of atrial flutter   3. Essential hypertension   4. Palpitation   5. Coronary artery disease involving native coronary artery of native heart without angina pectoris      PLAN:  In order of problems listed above:  3. CAD: Atypical chest pain: Recent treadmill Myoview was negative. He continued to have symptom afterward, we will manage medically. Add Imdur 30 mg daily.  4. Atrial flutter s/p ablation: Continue eliquis. Recent loop recorder transmission did not reveal any new atrial  fibrillation or atrial flutter.  5. Hypertension: Blood pressure well controlled  6. Palpitation: No obvious finding on recent loop recorder interrogation. He has not taken any diltiazem for the past few month, given lack  of any recurrence of atrial flutter, I am okay with him off of it for now.    Medication Adjustments/Labs and Tests Ordered: Current medicines are reviewed at length with the patient today.  Concerns regarding medicines are outlined above.  Medication changes, Labs and Tests ordered today are listed in the Patient Instructions below. Patient Instructions  Azalee Course, PA-C has recommended making the following medication changes: 1. STOP Diltiazem 2. START Isosorbide Mononitrate 30 mg - take 1 tablet by mouth daily  Your physician recommends that you schedule a follow-up appointment in 6 months with Dr Graciela Husbands.  If you need a refill on your cardiac medications before your next appointment, please call your pharmacy.    Ramond Dial, [place]  12/08/2016 9:03 AM    [facility] [address] [place], [place], [place]  [address] Phone: [phone]; Fax: [phone]

## 2016-12-08 ENCOUNTER — Encounter: Payer: Self-pay | Admitting: Physician Assistant

## 2016-12-08 NOTE — Addendum Note (Signed)
Addended byLisabeth Devoid: Keeva Reisen on: 12/08/2016 09:04 AM   Modules accepted: Level of Service

## 2016-12-16 ENCOUNTER — Telehealth: Payer: Self-pay | Admitting: *Deleted

## 2016-12-16 NOTE — Telephone Encounter (Addendum)
Spoke with patient regarding ~5sec duration pause episode noted on LINQ on 12/13/16 at 0342.  Patient reports he was asymptomatic with episode--was asleep at the time.    Patient reports ongoing dyspnea with exertion, short duration palpitations, and an intermittent sensation of chest "tenseness."  No recent tachy/AF episodes noted on LINQ.  He reports he is aware that his recent tests (exersice stress test and echo per notes) were normal, but he is concerned about these ongoing symptoms.  He has been taking IMDUR at night as it causes him headaches when he takes it in the morning.  He does not feel that it has helped his symptoms.    Patient was instructed at 12/06/16 appointment with Azalee CourseHao Meng, PA, to f/u with Dr. Graciela HusbandsKlein in 6 months.  He requests recommendations about what his next steps should be.  Advised I will review with Dr. Graciela HusbandsKlein for recommendations and patient is agreeable to this plan.

## 2016-12-17 NOTE — Telephone Encounter (Signed)
He can used the pt activator for his LINQ to clarify what the rhythm abnormality might be that his contributing to his palpiation s

## 2016-12-18 NOTE — Telephone Encounter (Signed)
LMOVM requesting call back to the [place].  Per Dr. Graciela HusbandsKlein, patient can be seen by him [facility]00[facility].  Will offer appointment to patient when he returns call.

## 2016-12-19 NOTE — Telephone Encounter (Signed)
Spoke with patient.  He is agreeable to using his symptom activator to mark palpitation episodes and is agreeable to an appointment with Dr. Graciela HusbandsKlein.  Advised patient that I will send an appointment request to the scheduler and she will be in contact with him.  Patient is appreciative and denies additional questions or concerns at this time.

## 2016-12-19 NOTE — Telephone Encounter (Signed)
Patient returning your call.

## 2016-12-20 ENCOUNTER — Other Ambulatory Visit: Payer: Self-pay | Admitting: Internal Medicine

## 2016-12-24 ENCOUNTER — Encounter: Payer: Self-pay | Admitting: Internal Medicine

## 2016-12-24 NOTE — Telephone Encounter (Signed)
Called patient and asked him to send a manual transmission. Patient verbalized understanding.

## 2016-12-24 NOTE — Telephone Encounter (Signed)
ILR transmission reviewed. Presenting rhythm: NSR. Symptom episode from today @ 0949 also indicated NSR. Called to inform patient of transmission results. Patient verbalized understanding.   Patient recently saw Azalee CourseHao Meng, PA d/t atypical CP. Per Weyerhaeuser CompanyHao Meng's note, patient had a recent myoview that indicated normal perfusion. Imdur was started.   Patient admits to d/c'ing his isosorbide d/t severe HA's. He said that he would like to discuss his sx's further with Dr.Klein at his upcoming appt on Monday 11/19. I instructed patient to go to the ER if he develops CP lasting >/=3315mins, has radiation, or if he develops any other sx's with the CP. Patient verbalized understanding.

## 2016-12-25 NOTE — Telephone Encounter (Signed)
Patient is scheduled for an appointment with Dr. Graciela HusbandsKlein on 12/30/16.  Encounter closed.

## 2016-12-30 ENCOUNTER — Encounter: Payer: Self-pay | Admitting: Internal Medicine

## 2016-12-30 ENCOUNTER — Ambulatory Visit (INDEPENDENT_AMBULATORY_CARE_PROVIDER_SITE_OTHER): Payer: BLUE CROSS/BLUE SHIELD | Admitting: Internal Medicine

## 2016-12-30 DIAGNOSIS — R002 Palpitations: Secondary | ICD-10-CM

## 2016-12-30 DIAGNOSIS — Z01812 Encounter for preprocedural laboratory examination: Secondary | ICD-10-CM

## 2016-12-30 DIAGNOSIS — I483 Typical atrial flutter: Secondary | ICD-10-CM

## 2016-12-30 DIAGNOSIS — R079 Chest pain, unspecified: Secondary | ICD-10-CM

## 2016-12-30 LAB — CBC WITH DIFFERENTIAL/PLATELET
BASOS ABS: 0 10*3/uL (ref 0.0–0.2)
Basos: 0 %
EOS (ABSOLUTE): 0.3 10*3/uL (ref 0.0–0.4)
EOS: 3 %
Hematocrit: 42.6 % (ref 37.5–51.0)
Hemoglobin: 14.8 g/dL (ref 13.0–17.7)
IMMATURE GRANULOCYTES: 0 %
Immature Grans (Abs): 0 10*3/uL (ref 0.0–0.1)
Lymphocytes Absolute: 2.5 10*3/uL (ref 0.7–3.1)
Lymphs: 28 %
MCH: 33.3 pg — ABNORMAL HIGH (ref 26.6–33.0)
MCHC: 34.7 g/dL (ref 31.5–35.7)
MCV: 96 fL (ref 79–97)
MONOCYTES: 8 %
MONOS ABS: 0.7 10*3/uL (ref 0.1–0.9)
NEUTROS ABS: 5.3 10*3/uL (ref 1.4–7.0)
Neutrophils: 61 %
PLATELETS: 215 10*3/uL (ref 150–379)
RBC: 4.45 x10E6/uL (ref 4.14–5.80)
RDW: 13.1 % (ref 12.3–15.4)
WBC: 8.8 10*3/uL (ref 3.4–10.8)

## 2016-12-30 LAB — BASIC METABOLIC PANEL
BUN / CREAT RATIO: 10 (ref 10–24)
BUN: 11 mg/dL (ref 8–27)
CALCIUM: 9.6 mg/dL (ref 8.6–10.2)
CHLORIDE: 101 mmol/L (ref 96–106)
CO2: 24 mmol/L (ref 20–29)
Creatinine, Ser: 1.05 mg/dL (ref 0.76–1.27)
GFR calc non Af Amer: 77 mL/min/{1.73_m2} (ref >59)
GFR, EST AFRICAN AMERICAN: 89 mL/min/{1.73_m2} (ref >59)
GLUCOSE: 106 mg/dL — AB (ref 65–99)
POTASSIUM: 4.7 mmol/L (ref 3.5–5.2)
Sodium: 139 mmol/L (ref 134–144)

## 2016-12-30 LAB — CUP PACEART INCLINIC DEVICE CHECK
MDC IDC PG IMPLANT DT: 20160831
MDC IDC SESS DTM: 20181119142419

## 2016-12-30 MED ORDER — APIXABAN 5 MG PO TABS: 5.0000 mg | ORAL_TABLET | Freq: Two times a day (BID) | ORAL | 1 refills | Status: DC

## 2016-12-30 NOTE — H&P (View-Only) (Signed)
his hip replacement      Patient Care Team: Kaleen Mask, MD as PCP - General (Family Medicine) Duke Salvia, MD as PCP - Cardiology (Cardiology)   HPI  Curtis Barton is a 61 y.o. male Seen in followup for atrial flutter for which he underwent catheter ablation while in hospital 9/15 Also with palps s/p ILR .  Echocardiogram 12/16 at that time demonstrated normal left ventricular function.  CHADS-VASc score was 3 with hypertension and presumed TIAs    Hip  replacement surgery 8/16 ; biceps replacement surgery 12/16. He is struggling with the implications of ongoing medical illnesses.  Over the last couple of months, he has noted significant change in his exercise capacity manifested by dyspnea on exertion and a midsternal-shoulder chest discomfort. These both relieved with rest and/or recurrent with repeated exertion  LHC 4/16 >>50% LAD. He continues to struggle with dyspnea  Echo demonstrated Large R  Side suggestive of R>L shunt and he has undergone MRI No clear explanation for this has been forthcoming; he is on CPAP for sleep apnea x months   He has continued to complain of exercise intolerance.  When we saw him 10/17 he dated a few months before then.  When I see him today he dates it back about a year.  It is unpredictably associated with levels of exertion.  It is unpredictably associated with chest discomfort and lightheadedness.  There unpredictably associated with palpitations  He saw HM-PA 9/18 and 10/18.  He undertook imaging as below.  Efforts to initiate nitroglycerin failed because of headache  DATE TEST    4/16 Cath   LAD-distal 50% RCA 50% mid  9/18 Myoview   EF 55-65 %  exercise duration 5'31"  peak heart rate 142  9/18 Echo   EF 55-60 % DD 1;  EE < 10            ILR insertion 8/16 for presyncope and tachypalpitations; there has been no interval detections.  He called because of recurrent symptoms and discussed the use of patient activator        Cardiac risk factors include hypertension, vascular disease, and former smoking;  .             Past Medical History:  Diagnosis Date  . Arthritis   . Asthma    as child- no problem  . Atrial flutter (HCC)    a. s/p ablation  . Difficult intubation    maybe 8 years years ago, no trouble since then  . Fatty liver   . Fuchs' uveitis syndrome    left eye  . History of kidney stones   . Hypertension   . Primary localized osteoarthritis of right knee   . Sleep apnea    home sleep study shows some sleep apnea  . Stroke (HCC)   . TIA (transient ischemic attack) 2010    Past Surgical History:  Procedure Laterality Date  . acle repair Right 2000   ACL repair  . ATRIAL ABLATION SURGERY  10/2013   Dr Graciela Husbands  . ATRIAL FLUTTER ABLATION N/A 10/13/2013   Performed by Duke Salvia, MD at [facility] CATH LAB  . BICEPS TENDON REPAIR    . CATARACT EXTRACTION W/ INTRAOCULAR LENS  IMPLANT, BILATERAL    . CYSTOSCOPY W/ URETERAL STENT PLACEMENT  1999  . EYE SURGERY    . HERNIA REPAIR    . KIDNEY SURGERY Right   . KNEE CARTILAGE SURGERY Bilateral   . LEFT  HEART CATHETERIZATION WITH CORONARY ANGIOGRAM N/A 05/17/2014   Performed by Corky CraftsVaranasi, Jayadeep S, [facility]  . Loop Recorder Insertion N/A 10/12/2014   Performed by Duke SalviaKlein, Yashika Mask C, [facility]  . RIGHT TOTAL KNEE ARTHROPLASTY Right 11/26/2013   Performed by Thera Flakeaffrey, W D Jr., [facility]  . ROTATOR CUFF REPAIR Right 2005  . SHOULDER ARTHROSCOPY WITH ROTATOR CUFF REPAIR AND DEBRIDEMENT with removal of loose bodies (suture) Right 11/07/2015   Performed by Jones Broomhandler, Justin, [facility]  . TONSILLECTOMY    . TOTAL HIP ARTHROPLASTY Right 12/02/2014   Performed by Frederico Hammanaffrey, Daniel, [facility]  . TOTAL KNEE ARTHROPLASTY Right 11/26/2013   dr caffrey    Current Outpatient Medications  Medication Sig Dispense Refill  . apixaban (ELIQUIS) 5 MG TABS tablet Take 1 tablet (5 mg total) by mouth 2 (two) times daily. 180  tablet 1  . cyclobenzaprine (FLEXERIL) 10 MG tablet Take 10 mg by mouth 3 (three) times daily as needed for muscle spasms.    Marland Kitchen. docusate sodium (COLACE) 100 MG capsule Take 1 capsule (100 mg total) by mouth 3 (three) times daily as needed. 20 capsule 0  . isosorbide mononitrate (IMDUR) 30 MG 24 hr tablet Take 1 tablet (30 mg total) by mouth daily. 90 tablet 3  . lisinopril (PRINIVIL,ZESTRIL) 20 MG tablet Take 1 tablet by mouth daily.    Marland Kitchen. moxifloxacin (VIGAMOX) 0.5 % ophthalmic solution Place 1 drop into the left eye 2 (two) times daily.     No current facility-administered medications for this visit.     Allergies  Allergen Reactions  . Nucynta [Tapentadol] Itching  . Codeine Itching  . Hydrocodone Itching  . Oxycodone Itching    Review of Systems negative except from HPI and PMH  Physical Exam BP 126/89   Pulse 72   Ht 6\' 4"  (1.93 m)   Wt 289 lb (131.1 kg)   SpO2 97%   BMI 35.18 kg/m  Well developed and nourished in no acute distress HENT normal Neck supple with JVP-7 Carotids brisk and full without bruits Clear Regular rate and rhythm, no murmurs or gallops Abd-soft with active BS without hepatomegaly No Clubbing cyanosis edema Skin-warm and dry A & Oriented  Grossly normal sensory and motor function    ECG sinus at 66 Interval 17/08/39 Nonspecific T wave flattening   Assessment and  Plan  Atrial flutter status post ablation   Palpitations    S/p Loop recorder  Chest pain /Dyspnea  Hypertension  Sleep disordered breathing   Atrial tachycardia-nonsustained  TIA--MRI neg   He continues to complain with progressive shortness of breath chest discomfort unrelated to palpitations.  This is been progressive over the last 6-12 months.  Given the lack of sensitivity and specificity of the Myoview scan, we will undertake catheterization; he had 50% lesions in mid RCA and  distal LAD in 2016 Will also include a right heart catheterization given his complaints of  dyspnea and not withstanding his normal EE'  We will also ask him to use his event recorder to try to clarify a possible contributing arrhythmia.  The fact that his symptoms are unpredictable makes this a little bit more challenging.  In the event that the his catheterization is unrevealing we will undertake cardiopulmonary stress testing.  In this regard I was impressed at his poor performance on his standard Bruce protocol not accomplishing 6 minutes  This required complex decision making  More than 50% of 45 min was spent in counseling related to the above

## 2016-12-30 NOTE — Progress Notes (Signed)
his hip replacement      Patient Care Team: Kaleen Mask, MD as PCP - General (Family Medicine) Duke Salvia, MD as PCP - Cardiology (Cardiology)   HPI  Curtis Barton is a 61 y.o. male Seen in followup for atrial flutter for which he underwent catheter ablation while in hospital 9/15 Also with palps s/p ILR .  Echocardiogram 12/16 at that time demonstrated normal left ventricular function.  CHADS-VASc score was 3 with hypertension and presumed TIAs    Hip  replacement surgery 8/16 ; biceps replacement surgery 12/16. He is struggling with the implications of ongoing medical illnesses.  Over the last couple of months, he has noted significant change in his exercise capacity manifested by dyspnea on exertion and a midsternal-shoulder chest discomfort. These both relieved with rest and/or recurrent with repeated exertion  LHC 4/16 >>50% LAD. He continues to struggle with dyspnea  Echo demonstrated Large R  Side suggestive of R>L shunt and he has undergone MRI No clear explanation for this has been forthcoming; he is on CPAP for sleep apnea x months   He has continued to complain of exercise intolerance.  When we saw him 10/17 he dated a few months before then.  When I see him today he dates it back about a year.  It is unpredictably associated with levels of exertion.  It is unpredictably associated with chest discomfort and lightheadedness.  There unpredictably associated with palpitations  He saw [facility]-PA 9/18 and 10/18.  He undertook imaging as below.  Efforts to initiate nitroglycerin failed because of headache  DATE TEST    4/16 Cath   LAD-distal 50% RCA 50% mid  9/18 Myoview   EF 55-65 %  exercise duration 5'31"  peak heart rate 142  9/18 Echo   EF 55-60 % DD 1;  EE < 10            ILR insertion 8/16 for presyncope and tachypalpitations; there has been no interval detections.  He called because of recurrent symptoms and discussed the use of patient activator        Cardiac risk factors include hypertension, vascular disease, and former smoking;  .             Past Medical History:  Diagnosis Date  . Arthritis   . Asthma    as child- no problem  . Atrial flutter (HCC)    a. s/p ablation  . Difficult intubation    maybe 8 years years ago, no trouble since then  . Fatty liver   . Fuchs' uveitis syndrome    left eye  . History of kidney stones   . Hypertension   . Primary localized osteoarthritis of right knee   . Sleep apnea    home sleep study shows some sleep apnea  . Stroke (HCC)   . TIA (transient ischemic attack) 2010    Past Surgical History:  Procedure Laterality Date  . acle repair Right 2000   ACL repair  . ATRIAL ABLATION SURGERY  10/2013   Dr Graciela Husbands  . ATRIAL FLUTTER ABLATION N/A 10/13/2013   Performed by Duke Salvia, MD at [facility] CATH LAB  . BICEPS TENDON REPAIR    . CATARACT EXTRACTION W/ INTRAOCULAR LENS  IMPLANT, BILATERAL    . CYSTOSCOPY W/ URETERAL STENT PLACEMENT  1999  . EYE SURGERY    . HERNIA REPAIR    . KIDNEY SURGERY Right   . KNEE CARTILAGE SURGERY Bilateral   . LEFT  HEART CATHETERIZATION WITH CORONARY ANGIOGRAM N/A 05/17/2014   Performed by Corky CraftsVaranasi, Jayadeep S, [facility] C[facility]  . Loop Recorder Insertion N/A 10/12/2014   Performed by Duke SalviaKlein, Steven C, [facility]  . RIGHT TOTAL KNEE ARTHROPLASTY Right 11/26/2013   Performed by Thera Flakeaffrey, W D Jr., [facility]  . ROTATOR CUFF REPAIR Right 2005  . SHOULDER ARTHROSCOPY WITH ROTATOR CUFF REPAIR AND DEBRIDEMENT with removal of loose bodies (suture) Right 11/07/2015   Performed by Jones Broomhandler, Justin, [facility]  . TONSILLECTOMY    . TOTAL HIP ARTHROPLASTY Right 12/02/2014   Performed by Frederico Hammanaffrey, Daniel, [facility]  . TOTAL KNEE ARTHROPLASTY Right 11/26/2013   dr caffrey    Current Outpatient Medications  Medication Sig Dispense Refill  . apixaban (ELIQUIS) 5 MG TABS tablet Take 1 tablet (5 mg total) by mouth 2 (two) times daily. 180  tablet 1  . cyclobenzaprine (FLEXERIL) 10 MG tablet Take 10 mg by mouth 3 (three) times daily as needed for muscle spasms.    Marland Kitchen. docusate sodium (COLACE) 100 MG capsule Take 1 capsule (100 mg total) by mouth 3 (three) times daily as needed. 20 capsule 0  . isosorbide mononitrate (IMDUR) 30 MG 24 hr tablet Take 1 tablet (30 mg total) by mouth daily. 90 tablet 3  . lisinopril (PRINIVIL,ZESTRIL) 20 MG tablet Take 1 tablet by mouth daily.    Marland Kitchen. moxifloxacin (VIGAMOX) 0.5 % ophthalmic solution Place 1 drop into the left eye 2 (two) times daily.     No current facility-administered medications for this visit.     Allergies  Allergen Reactions  . Nucynta [Tapentadol] Itching  . Codeine Itching  . Hydrocodone Itching  . Oxycodone Itching    Review of Systems negative except from HPI and PMH  Physical Exam BP 126/89   Pulse 72   Ht 6\' 4"  (1.93 m)   Wt 289 lb (131.1 kg)   SpO2 97%   BMI 35.18 kg/m  Well developed and nourished in no acute distress HENT normal Neck supple with JVP-7 Carotids brisk and full without bruits Clear Regular rate and rhythm, no murmurs or gallops Abd-soft with active BS without hepatomegaly No Clubbing cyanosis edema Skin-warm and dry A & Oriented  Grossly normal sensory and motor function    ECG sinus at 66 Interval 17/08/39 Nonspecific T wave flattening   Assessment and  Plan  Atrial flutter status post ablation   Palpitations    S/p Loop recorder  Chest pain /Dyspnea  Hypertension  Sleep disordered breathing   Atrial tachycardia-nonsustained  TIA--MRI neg   He continues to complain with progressive shortness of breath chest discomfort unrelated to palpitations.  This is been progressive over the last 6-12 months.  Given the lack of sensitivity and specificity of the Myoview scan, we will undertake catheterization; he had 50% lesions in mid RCA and  distal LAD in 2016 Will also include a right heart catheterization given his complaints of  dyspnea and not withstanding his normal EE'  We will also ask him to use his event recorder to try to clarify a possible contributing arrhythmia.  The fact that his symptoms are unpredictable makes this a little bit more challenging.  In the event that the his catheterization is unrevealing we will undertake cardiopulmonary stress testing.  In this regard I was impressed at his poor performance on his standard Bruce protocol not accomplishing 6 minutes  This required complex decision making  More than 50% of 45 min was spent in counseling related to the above

## 2016-12-30 NOTE — Patient Instructions (Signed)
Medication Instructions:  Your physician recommends that you continue on your current medications as directed. Please refer to the Current Medication list given to you today.  -- If you need a refill on your cardiac medications before your next appointment, please call your pharmacy. --  Labwork: Today:  CBC/BMET   Testing/Procedures: Your physician has requested that you have a cardiac catheterization. Cardiac catheterization is used to diagnose and/or treat various heart conditions. Doctors may recommend this procedure for a number of different reasons. The most common reason is to evaluate chest pain. Chest pain can be a symptom of coronary artery disease (CAD), and cardiac catheterization can show whether plaque is narrowing or blocking your heart's arteries. This procedure is also used to evaluate the valves, as well as measure the blood flow and oxygen levels in different parts of your heart. For further information please visit [email or web address]. Please follow instruction sheet, as given.    Follow-Up:   Your physician wants you to follow-up in: 1 year with Dr. Graciela HusbandsKlein    Thank you for choosing [facility]!!   Sigurd SosMichael Malonie Tatum, RN [phone]  Any Other Special Instructions Will Be Listed Below (If Applicable).    [facility] [address] [facility] [address], [address] [place] [address] Dept: [phone] Loc: [phone]  Corrinne EagleShannon H Stanger  12/30/2016  You are scheduled for a Cardiac Catheterization on Monday, December 3 with Dr. Lance MussJayadeep Varanasi.  1. [facility]: [address] [place], [place] [address] at 5:30 AM (two hours before your procedure to ensure your preparation). Free valet parking service is available.   Special note: Every effort is made to have your procedure done on time. Please understand that emergencies sometimes  delay scheduled procedures.  2. Diet: Do not eat or drink anything after midnight prior to your procedure except sips of water to take medications.  3. Labs: You will need to have blood drawn on Monday, November [facility] [address], [place]  Open: 7:30am - 5pm    Phone: [phone]. You do not need to be fasting.  4. Medication instructions in preparation for your procedure:  Take your last dose of Eliquis on Friday 11/30.  On the morning of your procedure, take your Aspirin and any morning medicines NOT listed above.  You may use sips of water.  5. Plan for one night stay--bring personal belongings. 6. Bring a current list of your medications and current insurance cards. 7. You MUST have a responsible person to drive you home. 8. Someone MUST be with you the first 24 hours after you arrive home or your discharge will be delayed. 9. Please wear clothes that are easy to get on and off and wear slip-on shoes.  Thank you for allowing us to care for you!   -- [facility]

## 2016-12-31 ENCOUNTER — Ambulatory Visit (INDEPENDENT_AMBULATORY_CARE_PROVIDER_SITE_OTHER): Payer: BLUE CROSS/BLUE SHIELD | Admitting: *Deleted

## 2016-12-31 DIAGNOSIS — R55 Syncope and collapse: Secondary | ICD-10-CM | POA: Diagnosis not present

## 2017-01-01 ENCOUNTER — Encounter: Payer: Self-pay | Admitting: Internal Medicine

## 2017-01-01 NOTE — Progress Notes (Signed)
Carelink Summary Report / Loop Recorder 

## 2017-01-09 ENCOUNTER — Telehealth: Payer: Self-pay

## 2017-01-09 NOTE — Telephone Encounter (Signed)
Patient [facility] scheduled for:  01/13/2017 @ 0730 Verified arrival time and place:  NT @ 0530 Confirmed AM meds to be taken pre-cath with sip of water: Eliquis-last dose 11/30 pm dose Take ASA Confirmed patient has responsible person to drive home post procedure and observe patient for 24 hours:  yes Addl concerns:  none

## 2017-01-13 ENCOUNTER — Encounter (HOSPITAL_COMMUNITY)
Admission: RE | Disposition: A | Discharge status: Home / Self Care | Payer: Self-pay | Source: Ambulatory Visit | Attending: Interventional Cardiology

## 2017-01-13 ENCOUNTER — Ambulatory Visit (HOSPITAL_COMMUNITY)
Admission: RE | Admit: 2017-01-13 | Discharge: 2017-01-13 | Disposition: A | Discharge status: Home / Self Care | Payer: BLUE CROSS/BLUE SHIELD | Source: Ambulatory Visit | Attending: Interventional Cardiology | Admitting: Interventional Cardiology

## 2017-01-13 ENCOUNTER — Encounter (HOSPITAL_COMMUNITY): Payer: Self-pay | Admitting: Cardiology

## 2017-01-13 DIAGNOSIS — Z96651 Presence of right artificial knee joint: Secondary | ICD-10-CM | POA: Diagnosis not present

## 2017-01-13 DIAGNOSIS — Z7902 Long term (current) use of antithrombotics/antiplatelets: Secondary | ICD-10-CM | POA: Insufficient documentation

## 2017-01-13 DIAGNOSIS — Z888 Allergy status to other drugs, medicaments and biological substances status: Secondary | ICD-10-CM | POA: Diagnosis not present

## 2017-01-13 DIAGNOSIS — M1990 Unspecified osteoarthritis, unspecified site: Secondary | ICD-10-CM | POA: Insufficient documentation

## 2017-01-13 DIAGNOSIS — R0600 Dyspnea, unspecified: Secondary | ICD-10-CM | POA: Diagnosis present

## 2017-01-13 DIAGNOSIS — Z7901 Long term (current) use of anticoagulants: Secondary | ICD-10-CM | POA: Insufficient documentation

## 2017-01-13 DIAGNOSIS — Z8673 Personal history of transient ischemic attack (TIA), and cerebral infarction without residual deficits: Secondary | ICD-10-CM | POA: Diagnosis not present

## 2017-01-13 DIAGNOSIS — Z9842 Cataract extraction status, left eye: Secondary | ICD-10-CM | POA: Diagnosis not present

## 2017-01-13 DIAGNOSIS — I4892 Unspecified atrial flutter: Secondary | ICD-10-CM | POA: Insufficient documentation

## 2017-01-13 DIAGNOSIS — Z955 Presence of coronary angioplasty implant and graft: Secondary | ICD-10-CM

## 2017-01-13 DIAGNOSIS — Z96641 Presence of right artificial hip joint: Secondary | ICD-10-CM | POA: Insufficient documentation

## 2017-01-13 DIAGNOSIS — Z7982 Long term (current) use of aspirin: Secondary | ICD-10-CM | POA: Diagnosis not present

## 2017-01-13 DIAGNOSIS — I10 Essential (primary) hypertension: Secondary | ICD-10-CM | POA: Insufficient documentation

## 2017-01-13 DIAGNOSIS — K760 Fatty (change of) liver, not elsewhere classified: Secondary | ICD-10-CM | POA: Diagnosis not present

## 2017-01-13 DIAGNOSIS — Z79899 Other long term (current) drug therapy: Secondary | ICD-10-CM | POA: Diagnosis not present

## 2017-01-13 DIAGNOSIS — R079 Chest pain, unspecified: Secondary | ICD-10-CM

## 2017-01-13 DIAGNOSIS — I25110 Atherosclerotic heart disease of native coronary artery with unstable angina pectoris: Secondary | ICD-10-CM | POA: Diagnosis not present

## 2017-01-13 DIAGNOSIS — H20812 Fuchs' heterochromic cyclitis, left eye: Secondary | ICD-10-CM | POA: Diagnosis not present

## 2017-01-13 DIAGNOSIS — Z9841 Cataract extraction status, right eye: Secondary | ICD-10-CM | POA: Diagnosis not present

## 2017-01-13 DIAGNOSIS — Z87891 Personal history of nicotine dependence: Secondary | ICD-10-CM | POA: Diagnosis not present

## 2017-01-13 DIAGNOSIS — Z885 Allergy status to narcotic agent status: Secondary | ICD-10-CM | POA: Insufficient documentation

## 2017-01-13 DIAGNOSIS — Z87442 Personal history of urinary calculi: Secondary | ICD-10-CM | POA: Insufficient documentation

## 2017-01-13 DIAGNOSIS — M1711 Unilateral primary osteoarthritis, right knee: Secondary | ICD-10-CM | POA: Diagnosis not present

## 2017-01-13 DIAGNOSIS — R0609 Other forms of dyspnea: Secondary | ICD-10-CM

## 2017-01-13 DIAGNOSIS — G4733 Obstructive sleep apnea (adult) (pediatric): Secondary | ICD-10-CM | POA: Diagnosis not present

## 2017-01-13 HISTORY — PX: RIGHT/LEFT HEART CATH AND CORONARY ANGIOGRAPHY: CATH118266

## 2017-01-13 HISTORY — PX: CORONARY STENT INTERVENTION: CATH118234

## 2017-01-13 HISTORY — DX: Atherosclerotic heart disease of native coronary artery without angina pectoris: I25.10

## 2017-01-13 LAB — POCT I-STAT 3, ART BLOOD GAS (G3+)
Acid-base deficit: 2 mmol/L (ref 0.0–2.0)
BICARBONATE: 22.5 mmol/L (ref 20.0–28.0)
O2 Saturation: 98 %
PCO2 ART: 38.7 mmHg (ref 32.0–48.0)
PO2 ART: 106 mmHg (ref 83.0–108.0)
TCO2: 24 mmol/L (ref 22–32)
pH, Arterial: 7.373 (ref 7.350–7.450)

## 2017-01-13 LAB — POCT I-STAT 3, VENOUS BLOOD GAS (G3P V)
Acid-base deficit: 3 mmol/L — ABNORMAL HIGH (ref 0.0–2.0)
BICARBONATE: 22.9 mmol/L (ref 20.0–28.0)
O2 Saturation: 69 %
PCO2 VEN: 45.4 mmHg (ref 44.0–60.0)
PH VEN: 7.311 (ref 7.250–7.430)
PO2 VEN: 39 mmHg (ref 32.0–45.0)
TCO2: 24 mmol/L (ref 22–32)

## 2017-01-13 LAB — POCT ACTIVATED CLOTTING TIME
Activated Clotting Time: 241 seconds
Activated Clotting Time: 356 seconds

## 2017-01-13 SURGERY — RIGHT/LEFT HEART CATH AND CORONARY ANGIOGRAPHY
Anesthesia: LOCAL

## 2017-01-13 MED ORDER — HEPARIN SODIUM (PORCINE) 1000 UNIT/ML IJ SOLN
INTRAMUSCULAR | Status: DC | PRN
  Administered 2017-01-13: 5000 [IU] via INTRAVENOUS
  Administered 2017-01-13: 6000 [IU] via INTRAVENOUS
  Administered 2017-01-13: 2000 [IU] via INTRAVENOUS

## 2017-01-13 MED ORDER — TIROFIBAN (AGGRASTAT) BOLUS VIA INFUSION
INTRAVENOUS | Status: DC | PRN
  Administered 2017-01-13: 3175 ug via INTRAVENOUS

## 2017-01-13 MED ORDER — MORPHINE SULFATE (PF) 4 MG/ML IV SOLN
INTRAVENOUS | Status: AC
  Administered 2017-01-13: 2 mg
  Filled 2017-01-13: qty 1

## 2017-01-13 MED ORDER — CLOPIDOGREL BISULFATE 300 MG PO TABS
ORAL_TABLET | ORAL | Status: DC | PRN
  Administered 2017-01-13: 600 mg via ORAL

## 2017-01-13 MED ORDER — ASPIRIN 81 MG PO CHEW: 81.0000 mg | CHEWABLE_TABLET | Freq: Every day | ORAL | Status: DC

## 2017-01-13 MED ORDER — LIDOCAINE HCL (PF) 1 % IJ SOLN
INTRAMUSCULAR | Status: DC | PRN
  Administered 2017-01-13: 5 mL

## 2017-01-13 MED ORDER — CLOPIDOGREL BISULFATE 75 MG PO TABS: 75.0000 mg | ORAL_TABLET | Freq: Every day | ORAL | 0 refills | Status: DC

## 2017-01-13 MED ORDER — ISOSORBIDE MONONITRATE ER 30 MG PO TB24
30.0000 mg | ORAL_TABLET | Freq: Every day | ORAL | Status: DC
  Administered 2017-01-13: 30 mg via ORAL
  Filled 2017-01-13: qty 1

## 2017-01-13 MED ORDER — SODIUM CHLORIDE 0.9 % WEIGHT BASED INFUSION: 1.0000 mL/kg/h | INTRAVENOUS | Status: DC

## 2017-01-13 MED ORDER — SODIUM CHLORIDE 0.9% FLUSH: 3.0000 mL | Freq: Two times a day (BID) | INTRAVENOUS | Status: DC

## 2017-01-13 MED ORDER — NITROGLYCERIN 1 MG/10 ML FOR IR/CATH LAB
INTRA_ARTERIAL | Status: DC | PRN
  Administered 2017-01-13: 200 ug via INTRACORONARY

## 2017-01-13 MED ORDER — FAMOTIDINE IN NACL 20-0.9 MG/50ML-% IV SOLN
INTRAVENOUS | Status: AC | PRN
  Administered 2017-01-13: 20 mg via INTRAVENOUS

## 2017-01-13 MED ORDER — SODIUM CHLORIDE 0.9 % IV SOLN: 250.0000 mL | INTRAVENOUS | Status: DC | PRN

## 2017-01-13 MED ORDER — FENTANYL CITRATE (PF) 100 MCG/2ML IJ SOLN
INTRAMUSCULAR | Status: AC
  Filled 2017-01-13: qty 2

## 2017-01-13 MED ORDER — PREDNISOLONE ACETATE 1 % OP SUSP: 1.0000 [drp] | OPHTHALMIC | Status: DC

## 2017-01-13 MED ORDER — LISINOPRIL 20 MG PO TABS: 20.0000 mg | ORAL_TABLET | Freq: Every day | ORAL | Status: DC

## 2017-01-13 MED ORDER — TIROFIBAN HCL IV 12.5 MG/250 ML
INTRAVENOUS | Status: DC | PRN
  Administered 2017-01-13: .15 ug/kg/min via INTRAVENOUS

## 2017-01-13 MED ORDER — HEPARIN (PORCINE) IN NACL 2-0.9 UNIT/ML-% IJ SOLN
INTRAMUSCULAR | Status: AC
  Filled 2017-01-13: qty 1000

## 2017-01-13 MED ORDER — IOPAMIDOL (ISOVUE-370) INJECTION 76%
INTRAVENOUS | Status: DC | PRN
  Administered 2017-01-13: 130 mL via INTRA_ARTERIAL

## 2017-01-13 MED ORDER — HYDRALAZINE HCL 20 MG/ML IJ SOLN: 5.0000 mg | INTRAMUSCULAR | Status: AC | PRN

## 2017-01-13 MED ORDER — MIDAZOLAM HCL 2 MG/2ML IJ SOLN
INTRAMUSCULAR | Status: DC | PRN
  Administered 2017-01-13: 1 mg via INTRAVENOUS
  Administered 2017-01-13: 2 mg via INTRAVENOUS

## 2017-01-13 MED ORDER — HEPARIN SODIUM (PORCINE) 1000 UNIT/ML IJ SOLN
INTRAMUSCULAR | Status: AC
  Filled 2017-01-13: qty 1

## 2017-01-13 MED ORDER — FENTANYL CITRATE (PF) 100 MCG/2ML IJ SOLN
INTRAMUSCULAR | Status: DC | PRN
  Administered 2017-01-13 (×2): 25 ug via INTRAVENOUS

## 2017-01-13 MED ORDER — SODIUM CHLORIDE 0.9 % WEIGHT BASED INFUSION
3.0000 mL/kg/h | INTRAVENOUS | Status: AC
  Administered 2017-01-13: 3 mL/kg/h via INTRAVENOUS

## 2017-01-13 MED ORDER — MORPHINE SULFATE (PF) 2 MG/ML IV SOLN: 2.0000 mg | Freq: Once | INTRAVENOUS | Status: DC

## 2017-01-13 MED ORDER — HEPARIN (PORCINE) IN NACL 2-0.9 UNIT/ML-% IJ SOLN
INTRAMUSCULAR | Status: AC | PRN
  Administered 2017-01-13: 1000 mL

## 2017-01-13 MED ORDER — VERAPAMIL HCL 2.5 MG/ML IV SOLN
INTRAVENOUS | Status: DC | PRN
  Administered 2017-01-13: 10 mL via INTRA_ARTERIAL

## 2017-01-13 MED ORDER — MORPHINE SULFATE (PF) 10 MG/ML IV SOLN: 2.0000 mg | Freq: Once | INTRAVENOUS | Status: DC

## 2017-01-13 MED ORDER — ANGIOPLASTY BOOK
Freq: Once | Status: DC
  Filled 2017-01-13: qty 1

## 2017-01-13 MED ORDER — SODIUM CHLORIDE 0.9% FLUSH: 3.0000 mL | INTRAVENOUS | Status: DC | PRN

## 2017-01-13 MED ORDER — ASPIRIN EC 81 MG PO TBEC: 81.0000 mg | DELAYED_RELEASE_TABLET | Freq: Every day | ORAL | 0 refills | Status: DC

## 2017-01-13 MED ORDER — NITROGLYCERIN 0.4 MG SL SUBL: 0.4000 mg | SUBLINGUAL_TABLET | SUBLINGUAL | 2 refills | Status: AC | PRN

## 2017-01-13 MED ORDER — APIXABAN 5 MG PO TABS: 5.0000 mg | ORAL_TABLET | Freq: Two times a day (BID) | ORAL | Status: DC

## 2017-01-13 MED ORDER — ACETAMINOPHEN 325 MG PO TABS: 650.0000 mg | ORAL_TABLET | ORAL | Status: DC | PRN

## 2017-01-13 MED ORDER — SODIUM CHLORIDE 0.9 % IV SOLN
INTRAVENOUS | Status: AC | PRN
  Administered 2017-01-13: 10 mL/h via INTRAVENOUS

## 2017-01-13 MED ORDER — VERAPAMIL HCL 2.5 MG/ML IV SOLN
INTRAVENOUS | Status: AC
  Filled 2017-01-13: qty 2

## 2017-01-13 MED ORDER — MIDAZOLAM HCL 2 MG/2ML IJ SOLN
INTRAMUSCULAR | Status: AC
  Filled 2017-01-13: qty 2

## 2017-01-13 MED ORDER — LABETALOL HCL 5 MG/ML IV SOLN: 10.0000 mg | INTRAVENOUS | Status: AC | PRN

## 2017-01-13 MED ORDER — SODIUM CHLORIDE 0.9 % IV SOLN: INTRAVENOUS | Status: AC

## 2017-01-13 MED ORDER — CYCLOBENZAPRINE HCL 10 MG PO TABS: 10.0000 mg | ORAL_TABLET | Freq: Three times a day (TID) | ORAL | Status: DC | PRN

## 2017-01-13 MED ORDER — ONDANSETRON HCL 4 MG/2ML IJ SOLN: 4.0000 mg | Freq: Four times a day (QID) | INTRAMUSCULAR | Status: DC | PRN

## 2017-01-13 MED ORDER — NITROGLYCERIN 1 MG/10 ML FOR IR/CATH LAB
INTRA_ARTERIAL | Status: AC
  Filled 2017-01-13: qty 10

## 2017-01-13 MED ORDER — LIDOCAINE HCL (PF) 1 % IJ SOLN
INTRAMUSCULAR | Status: AC
  Filled 2017-01-13: qty 30

## 2017-01-13 MED ORDER — ASPIRIN 81 MG PO CHEW: 81.0000 mg | CHEWABLE_TABLET | ORAL | Status: DC

## 2017-01-13 MED ORDER — TIROFIBAN HCL IN NACL 5-0.9 MG/100ML-% IV SOLN
INTRAVENOUS | Status: AC
  Filled 2017-01-13: qty 100

## 2017-01-13 MED ORDER — CLOPIDOGREL BISULFATE 75 MG PO TABS: 75.0000 mg | ORAL_TABLET | Freq: Every day | ORAL | Status: DC

## 2017-01-13 MED ORDER — FAMOTIDINE IN NACL 20-0.9 MG/50ML-% IV SOLN
INTRAVENOUS | Status: AC
  Filled 2017-01-13: qty 50

## 2017-01-13 MED ORDER — IOPAMIDOL (ISOVUE-370) INJECTION 76%
INTRAVENOUS | Status: AC
  Filled 2017-01-13: qty 200

## 2017-01-13 MED FILL — CLOPIDOGREL 75 MG TABLET: 75 | 30 days supply | Qty: 30 | Fill #0

## 2017-01-13 SURGICAL SUPPLY — 21 items
BALLN SAPPHIRE 2.5X12 (BALLOONS) ×2
BALLN ~~LOC~~ EMERGE MR 3.5X12 (BALLOONS) ×2
BALLOON SAPPHIRE 2.5X12 (BALLOONS) IMPLANT
BALLOON ~~LOC~~ EMERGE MR 3.5X12 (BALLOONS) IMPLANT
CATH BALLN WEDGE 5F 110CM (CATHETERS) ×1 IMPLANT
CATH INFINITI 5 FR JR5 (CATHETERS) ×1 IMPLANT
CATH INFINITI 5FR JL4 (CATHETERS) ×1 IMPLANT
CATH LAUNCHER 6FR EBU3.5 (CATHETERS) ×1 IMPLANT
DEVICE RAD COMP TR BAND LRG (VASCULAR PRODUCTS) ×1 IMPLANT
GLIDESHEATH SLEND SS 6F .021 (SHEATH) ×1 IMPLANT
GUIDEWIRE INQWIRE 1.5J.035X260 (WIRE) IMPLANT
INQWIRE 1.5J .035X260CM (WIRE) ×2
KIT ENCORE 26 ADVANTAGE (KITS) ×1 IMPLANT
KIT HEART LEFT (KITS) ×2 IMPLANT
PACK CARDIAC CATHETERIZATION (CUSTOM PROCEDURE TRAY) ×2 IMPLANT
SHEATH GLIDE SLENDER 4/5FR (SHEATH) ×1 IMPLANT
STENT SYNERGY DES 3X24 (Permanent Stent) ×1 IMPLANT
TRANSDUCER W/STOPCOCK (MISCELLANEOUS) ×2 IMPLANT
TUBING CIL FLEX 10 FLL-RA (TUBING) ×2 IMPLANT
VALVE GUARDIAN II ~~LOC~~ HEMO (MISCELLANEOUS) ×1 IMPLANT
WIRE ASAHI PROWATER 180CM (WIRE) ×1 IMPLANT

## 2017-01-13 NOTE — Discharge Instructions (Signed)
Radial Site Care °Refer to this sheet in the next few weeks. These instructions provide you with information about caring for yourself after your procedure. Your health care provider may also give you more specific instructions. Your treatment has been planned according to current medical practices, but problems sometimes occur. Call your health care provider if you have any problems or questions after your procedure. °What can I expect after the procedure? °After your procedure, it is typical to have the following: °· Bruising at the radial site that usually fades within 1-2 weeks. °· Blood collecting in the tissue (hematoma) that may be painful to the touch. It should usually decrease in size and tenderness within 1-2 weeks. ° °Follow these instructions at home: °· Take medicines only as directed by your health care provider. °· You may shower 24-48 hours after the procedure or as directed by your health care provider. Remove the bandage (dressing) and gently wash the site with plain soap and water. Pat the area dry with a clean towel. Do not rub the site, because this may cause bleeding. °· Do not take baths, swim, or use a hot tub until your health care provider approves. °· Check your insertion site every day for redness, swelling, or drainage. °· Do not apply powder or lotion to the site. °· Do not flex or bend the affected arm for 24 hours or as directed by your health care provider. °· Do not push or pull heavy objects with the affected arm for 24 hours or as directed by your health care provider. °· Do not lift over 10 lb (4.5 kg) for 5 days after your procedure or as directed by your health care provider. °· Ask your health care provider when it is okay to: °? Return to work or school. °? Resume usual physical activities or sports. °? Resume sexual activity. °· Do not drive home if you are discharged the same day as the procedure. Have someone else drive you. °· You may drive 24 hours after the procedure  unless otherwise instructed by your health care provider. °· Do not operate machinery or power tools for 24 hours after the procedure. °· If your procedure was done as an outpatient procedure, which means that you went home the same day as your procedure, a responsible adult should be with you for the first 24 hours after you arrive home. °· Keep all follow-up visits as directed by your health care provider. This is important. °Contact a health care provider if: °· You have a fever. °· You have chills. °· You have increased bleeding from the radial site. Hold pressure on the site. °Get help right away if: °· You have unusual pain at the radial site. °· You have redness, warmth, or swelling at the radial site. °· You have drainage (other than a small amount of blood on the dressing) from the radial site. °· The radial site is bleeding, and the bleeding does not stop after 30 minutes of holding steady pressure on the site. °· Your arm or hand becomes pale, cool, tingly, or numb. °This information is not intended to replace advice given to you by your health care provider. Make sure you discuss any questions you have with your health care provider. °Document Released: 03/02/2010 Document Revised: 07/06/2015 Document Reviewed: 08/16/2013 °Elsevier Interactive Patient Education © 2018 [employer] Inc[employer] ° ° ° °Moderate Conscious Sedation, Adult, Care After °These instructions provide you with information about caring for yourself after your procedure. Your health care provider   may also give you more specific instructions. Your treatment has been planned according to current medical practices, but problems sometimes occur. Call your health care provider if you have any problems or questions after your procedure. °What can I expect after the procedure? °After your procedure, it is common: °· To feel sleepy for several hours. °· To feel clumsy and have poor balance for several hours. °· To have poor judgment for several  hours. °· To vomit if you eat too soon. ° °Follow these instructions at home: °For at least 24 hours after the procedure: ° °· Do not: °? Participate in activities where you could fall or become injured. °? Drive. °? Use heavy machinery. °? Drink alcohol. °? Take sleeping pills or medicines that cause drowsiness. °? Make important decisions or sign legal documents. °? Take care of children on your own. °· Rest. °Eating and drinking °· Follow the diet recommended by your health care provider. °· If you vomit: °? Drink water, juice, or soup when you can drink without vomiting. °? Make sure you have little or no nausea before eating solid foods. °General instructions °· Have a responsible adult stay with you until you are awake and alert. °· Take over-the-counter and prescription medicines only as told by your health care provider. °· If you smoke, do not smoke without supervision. °· Keep all follow-up visits as told by your health care provider. This is important. °Contact a health care provider if: °· You keep feeling nauseous or you keep vomiting. °· You feel light-headed. °· You develop a rash. °· You have a fever. °Get help right away if: °· You have trouble breathing. °This information is not intended to replace advice given to you by your health care provider. Make sure you discuss any questions you have with your health care provider. °Document Released: 11/18/2012 Document Revised: 07/03/2015 Document Reviewed: 05/20/2015 °Elsevier Interactive Patient Education © 2018 Elsevier Inc. ° °

## 2017-01-13 NOTE — Progress Notes (Signed)
Up and walked and tolerated well;no c/o left arm or shoulder pain; Lindsey,NP in and ok to d/c home

## 2017-01-13 NOTE — Progress Notes (Signed)
CARDIAC REHAB PHASE I   Pt s/p PCI for same day discharge. Completed PCI/stent education. Reviewed risk factors, anti-platelet therapy, stent card, activity restrictions, ntg, exercise, heart healthy diet and phase 2 cardiac rehab. Pt verbalized understanding. Pt agrees to phase 2 cardiac rehab referral, [facility]. Pt tin bed, call bell within reach.   [id number] Joylene GrapesEmily C Juvenal Umar, RN, BSN 01/13/2017 12:20 PM

## 2017-01-13 NOTE — Progress Notes (Signed)
Lindsay,NP in to see client and notified of continued left shoulder pain; client had told me he had left arm pain earlier;however, client did not tell me he had left shoulder pain; states has had left shoulder pain since procedure

## 2017-01-13 NOTE — Discharge Summary (Signed)
Discharge Summary/Same Day PCI    Patient ID: Curtis Barton,  MRN: [id number], DOB/AGE: Jun 30, 1955 61 y.o.  Admit date: 01/13/2017 Discharge date: 01/13/2017  Primary Care Provider: Kaleen MaskElkins, Wilson Ledger Primary Cardiologist: Graciela HusbandsKlein  Discharge Diagnoses    Active Problems:   Coronary artery disease involving native coronary artery of native heart with unstable angina pectoris (HCC)   DOE (dyspnea on exertion)   Allergies Allergies  Allergen Reactions  . Nucynta [Tapentadol] Itching  . Codeine Itching  . Hydrocodone Itching  . Oxycodone Itching    Diagnostic Studies/Procedures    Cath: 01/13/17  Conclusion     Prox RCA to Mid RCA lesion is 25% stenosed.  Mid Cx lesion is 50% stenosed.  Mid LAD lesion is 75% stenosed.  A drug-eluting stent was successfully placed using a STENT SYNERGY DES 3X24, postdilated to 3.5 mm.  Post intervention, there is a 0% residual stenosis.  Dist LAD lesion is 50% stenosed, appeared unchanged from prior. Lesion is past a very tortuous distal LAD.  LV end diastolic pressure is normal, 13 mm Hg.  The left ventricular ejection fraction is 55-65% by visual estimate.  There is no aortic valve stenosis.  Ao sat 98%; PA sat 69%; CO 5.8 L/min; CI 2.28. Normal PA pressures. PCWP mean 13 mm Hg.   Continue aggressive secondary prevention.  Plan for same day PCI.  Restart Eliquis tomorrow.  Will continue aspirin, Plavix and Eliquis for 30 days.  After 30 days, stop aspirin.  Continue Plavix and Eliquis together for 6 months.  At that point, could consider stopping Plavix.  If there is a bleeding issue, could consider  Stopping Plavix as soon as 3 months.  Synergy stent was used so that antiplatelet therapy duration could be shortened if needed.   _____________   History of Present Illness     61 yo male with PMH of Atrial Flutter, TIA, HTN, OSA who is followed by Dr. Graciela HusbandsKlein as an outpatient. Presented to the office on 11/19  complaining of exercise intolerance. Given progression of symptoms since last cath, he was set up for outpatient cardiac cath.   Hospital Course     Underwent cardiac cath with Dr. Eldridge DaceVaranasi noted above with successful PCI to the mLAD. Plan for triple therapy with ASA/plavix/eliquis for one month then stop ASA with others continued for at least 6 months. No complications noted post cath. Radial site stable. Instructions/restrictions given prior to discharge.    Curtis Barton was seen by Dr. Eldridge DaceVaranasi and determined stable for discharge home. Follow up in the office has been arranged. Medications are listed below.   _____________  Discharge Vitals Blood pressure 129/81, pulse 65, temperature 97.7 F (36.5 C), temperature source Oral, resp. rate 16, height 6\' 4"  (1.93 m), weight 280 lb (127 kg), SpO2 99 %.  Filed Weights   01/13/17 0557  Weight: 280 lb (127 kg)    Labs & Radiologic Studies    CBC No results for input(s): WBC, NEUTROABS, HGB, HCT, MCV, PLT in the last 72 hours. Basic Metabolic Panel No results for input(s): NA, K, CL, CO2, GLUCOSE, BUN, CREATININE, CALCIUM, MG, PHOS in the last 72 hours. Liver Function Tests No results for input(s): AST, ALT, ALKPHOS, BILITOT, PROT, ALBUMIN in the last 72 hours. No results for input(s): LIPASE, AMYLASE in the last 72 hours. Cardiac Enzymes No results for input(s): CKTOTAL, CKMB, CKMBINDEX, TROPONINI in the last 72 hours. BNP Invalid input(s): POCBNP D-Dimer No results for input(s): DDIMER  in the last 72 hours. Hemoglobin A1C No results for input(s): HGBA1C in the last 72 hours. Fasting Lipid Panel No results for input(s): CHOL, HDL, LDLCALC, TRIG, CHOLHDL, LDLDIRECT in the last 72 hours. Thyroid Function Tests No results for input(s): TSH, T4TOTAL, T3FREE, THYROIDAB in the last 72 hours.  Invalid input(s): FREET3 _____________  No results found. Disposition   Pt is being discharged home today in good  condition.  Follow-up Plans & Appointments    Follow-up Information    Azalee CourseMeng, Hao, [place] Follow up on 01/21/2017.   Specialties:  Cardiology, Radiology Why:  at 11:30am for your follow up appt.  Contact information: [address] [place] [address] [phone]          Discharge Instructions    Amb Referral to Cardiac Rehabilitation   Complete by:  As directed    Diagnosis:  Coronary Stents      Discharge Medications     Medication List    TAKE these medications   apixaban 5 MG Tabs tablet Commonly known as:  ELIQUIS Take 1 tablet (5 mg total) 2 (two) times daily by mouth.   aspirin EC 81 MG tablet Take 1 tablet (81 mg total) by mouth daily.   clopidogrel 75 MG tablet Commonly known as:  PLAVIX Take 1 tablet (75 mg total) by mouth daily.   cyclobenzaprine 10 MG tablet Commonly known as:  FLEXERIL Take 10 mg by mouth 3 (three) times daily as needed for muscle spasms.   isosorbide mononitrate 30 MG 24 hr tablet Commonly known as:  IMDUR Take 1 tablet (30 mg total) by mouth daily.   lisinopril 20 MG tablet Commonly known as:  PRINIVIL,ZESTRIL Take 1 tablet by mouth daily.   nitroGLYCERIN 0.4 MG SL tablet Commonly known as:  NITROSTAT Place 1 tablet (0.4 mg total) under the tongue every 5 (five) minutes as needed.   prednisoLONE acetate 1 % ophthalmic suspension Commonly known as:  PRED FORTE Place 1 drop into the left eye every morning.         Outstanding Labs/Studies   N/a   Duration of Discharge Encounter   Greater than 30 minutes including physician time.  Signed, Laverda PageLindsay Roberts NP-C 01/13/2017, 2:58 PM   I have examined the patient and reviewed assessment and plan and discussed with patient.  Agree with above as stated.  S/p DES to the mid LAD.  Residual moderate disease in the distal LAD and mid circumflex.  Significant tortuosity prior to the distal LAD lesion.  After intracoronary nitroglycerin, appearance was improved.   Typical angina reproduced with balloon inflation.  He needs aggressive secondary prevention including lipid-lowering therapy.  Continue healthy diet.  I stressed the importance of dual antiplatelet therapy.  Resume anticoagulation tomorrow.  Stop aspirin after 30 days.  Continue Plavix and Eliquis for 6 months.  Could stop Plavix earlier if there was a bleeding issue, possibly after 3 months.  Lance MussJayadeep Katrell Milhorn

## 2017-01-13 NOTE — Interval H&P Note (Signed)
Cath Lab Visit (complete for each Cath Lab visit)  Clinical Evaluation Leading to the Procedure:   ACS: No.  Non-ACS:    Anginal Classification: CCS III  Anti-ischemic medical therapy: Minimal Therapy (1 class of medications)  Non-Invasive Test Results: No non-invasive testing performed  Prior CABG: No previous CABG   Accelerating chest pain.   History and Physical Interval Note:  01/13/2017 7:28 AM  Corrinne EagleShannon H Mcclish  has presented today for surgery, with the diagnosis of cp  The various methods of treatment have been discussed with the patient and family. After consideration of risks, benefits and other options for treatment, the patient has consented to  Procedure(s): RIGHT/LEFT HEART CATH AND CORONARY ANGIOGRAPHY (N/A) as a surgical intervention .  The patient's history has been reviewed, patient examined, no change in status, stable for surgery.  I have reviewed the patient's chart and labs.  Questions were answered to the patient's satisfaction.     Lance MussJayadeep Malya Cirillo

## 2017-01-14 ENCOUNTER — Encounter (HOSPITAL_COMMUNITY): Payer: Self-pay | Admitting: Interventional Cardiology

## 2017-01-15 ENCOUNTER — Telehealth (HOSPITAL_COMMUNITY): Payer: Self-pay

## 2017-01-15 NOTE — Telephone Encounter (Signed)
Patient's insurance is active and benefits verified through [employer] - No co-pay, deductible amount of $400.00/$0.00 has been met, out of pocket amount of $800.00/$800.00 has been met, 30% co-insurance, and no pre-authorization is required. Passport/reference [id number]  Patient will be contacted and scheduled after review by RN.

## 2017-01-16 LAB — CUP PACEART REMOTE DEVICE CHECK
Date Time Interrogation Session: 20181120131444
Implantable Pulse Generator Implant Date: 20160831

## 2017-01-17 ENCOUNTER — Telehealth (HOSPITAL_COMMUNITY): Payer: Self-pay

## 2017-01-17 NOTE — Telephone Encounter (Signed)
Attempted to call patient in regards to Cardiac Rehab - Lm on Vm °

## 2017-01-21 ENCOUNTER — Ambulatory Visit: Payer: BLUE CROSS/BLUE SHIELD | Admitting: Physician Assistant

## 2017-01-21 ENCOUNTER — Telehealth (HOSPITAL_COMMUNITY): Payer: Self-pay

## 2017-01-21 NOTE — Telephone Encounter (Signed)
Patient returned phone call late Friday afternoon - returned call to patient.. Lm on vm

## 2017-01-24 ENCOUNTER — Ambulatory Visit (INDEPENDENT_AMBULATORY_CARE_PROVIDER_SITE_OTHER): Payer: BLUE CROSS/BLUE SHIELD | Admitting: Physician Assistant

## 2017-01-24 ENCOUNTER — Encounter: Payer: Self-pay | Admitting: Physician Assistant

## 2017-01-24 VITALS — BP 100/67 | HR 72 | Ht 76.0 in | Wt 292.4 lb

## 2017-01-24 DIAGNOSIS — R0609 Other forms of dyspnea: Secondary | ICD-10-CM

## 2017-01-24 DIAGNOSIS — R0600 Dyspnea, unspecified: Secondary | ICD-10-CM

## 2017-01-24 DIAGNOSIS — I25110 Atherosclerotic heart disease of native coronary artery with unstable angina pectoris: Secondary | ICD-10-CM | POA: Diagnosis not present

## 2017-01-24 NOTE — Patient Instructions (Signed)
Medication Instructions:  Your physician recommends that you continue on your current medications as directed. Please refer to the Current Medication list given to you today.  If you need a refill on your cardiac medications before your next appointment, please call your pharmacy.  Special Instructions: WE WILL CALL ABOUT PATCH LATER TO DISCUSS  Follow-Up: Your physician wants you to follow-up in: 3 MONTHS WITH DR Eldridge DaceVaranasi .   KEEL SCHEDULED APPOINTMENT WITH DR Graciela HusbandsKLEIN  [facility] you [facility]

## 2017-01-24 NOTE — Progress Notes (Signed)
Cardiology Office Note   Date:  01/24/2017   ID:  Melbourne, Jakubiak 04-11-1955, MRN [id number]  PCP:  Kaleen Mask, MD  Cardiologist: Dr. Graciela Husbands, 12/30/2016 Theodore Demark, PA-C   Chief Complaint  Patient presents with  . Follow-up    R/LHC stents placed, pt c/o SOB    History of Present Illness: EVENS MENO is a 61 y.o. male with a history of Atrial Flutter s/p ablation, TIA, HTN, OSA, chronic anticoagulation with Eliquis, CHA2DS2VASC=4 (HTN, CAD, TIA x 2)  11/19 office visit, worsening exercise tolerance, cardiac cath scheduled 12/3 cardiac cath with DES to the LAD  Corrinne Eagle presents for cardiology follow up.  He remembers getting CP during the cath, during PCI. He now realizes this was his angina. The pain was in his upper L chest and into his L shoulder, tightness or cramping pain. This was associated with a strange taste as well. He has not had those symptoms since the PCI. He was not getting the CP with exertion, but was getting SOB.  He feels the shortness of breath was different from ordinary shortness of breath with exertion.  He now recognizes that this was likely cardiac as well.  He has had some lateral chest pain, more on the side. 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 He is willing to do cardiac rehab, scheduling has been tough (weather).   He and his wife eat out a lot. He still has some cows, no longer has a garden.   He is compliant with the meds. He has an area of ecchymosis on his R breast, no known injury. No other bleeding issues.  He is excited about being able to possibly come off the Plavix in 3 months.  He is aware that he stops the aspirin after 30 days.  He needs dental work done and his dentist wonders if he needs preprocedure antibiotics because of his stent.  Of note, he has an artificial knee and an artificial hip as well.  He has a back problem and they want to do a patch with dexamethasone or  Voltaren gel.  He wonders if this is okay with his anticoagulation.   Past Medical History:  Diagnosis Date  . Arthritis   . Asthma    as child- no problem  . Atrial flutter (HCC)    a. s/p ablation  . CAD (coronary artery disease)    12/18 PCI/DES x1 to mLAD, normal EF  . Difficult intubation    maybe 8 years years ago, no trouble since then  . Fatty liver   . Fuchs' uveitis syndrome    left eye  . History of kidney stones   . Hypertension   . Primary localized osteoarthritis of right knee   . Sleep apnea    home sleep study shows some sleep apnea  . Stroke (HCC)   . TIA (transient ischemic attack) 2010    Past Surgical History:  Procedure Laterality Date  . acle repair Right 2000   ACL repair  . ATRIAL ABLATION SURGERY  10/2013   Dr Graciela Husbands  . ATRIAL FLUTTER ABLATION N/A 10/13/2013   Procedure: ATRIAL FLUTTER ABLATION;  Surgeon: Duke Salvia, MD;  Location[facility] CATH LAB;  Service: Cardiovascular;  Laterality: N/A;  . BICEPS TENDON REPAIR    . CATARACT EXTRACTION W/ INTRAOCULAR LENS  IMPLANT, BILATERAL    . CORONARY STENT INTERVENTION N/A 01/13/2017   Procedure: CORONARY STENT INTERVENTION;  Surgeon: Corky Crafts, MD;  Location: [facility] INVASIVE CV LAB;  Service: Cardiovascular;  Laterality: N/A;  . CYSTOSCOPY W/ URETERAL STENT PLACEMENT  1999  . EP IMPLANTABLE DEVICE N/A 10/12/2014   Procedure: Loop Recorder Insertion;  Surgeon: Duke Salvia, MD;  Location: [facility] INVASIVE CV LAB;  Service: Cardiovascular;  Laterality: N/A;  . EYE SURGERY    . HERNIA REPAIR    . KIDNEY SURGERY Right   . KNEE CARTILAGE SURGERY Bilateral   . LEFT HEART CATHETERIZATION WITH CORONARY ANGIOGRAM N/A 05/17/2014   Procedure: LEFT HEART CATHETERIZATION WITH CORONARY ANGIOGRAM;  Surgeon: Corky Crafts, MD;  Location: [facility] CATH LAB;  Service: Cardiovascular;  Laterality: N/A;  . RIGHT/LEFT HEART CATH AND CORONARY ANGIOGRAPHY N/A 01/13/2017   Procedure: RIGHT/LEFT HEART CATH AND CORONARY  ANGIOGRAPHY;  Surgeon: Corky Crafts, MD;  Location: [facility] INVASIVE CV LAB;  Service: Cardiovascular;  Laterality: N/A;  . ROTATOR CUFF REPAIR Right 2005  . SHOULDER ARTHROSCOPY WITH ROTATOR CUFF REPAIR Right 11/07/2015   Procedure: SHOULDER ARTHROSCOPY WITH ROTATOR CUFF REPAIR AND DEBRIDEMENT with removal of loose bodies (suture);  Surgeon: Jones Broom, MD;  Location: [facility] OR;  Service: Orthopedics;  Laterality: Right;  . TONSILLECTOMY    . TOTAL HIP ARTHROPLASTY Right 12/02/2014   Procedure: TOTAL HIP ARTHROPLASTY;  Surgeon: Frederico Hamman, MD;  Location: [facility] OR;  Service: Orthopedics;  Laterality: Right;  . TOTAL KNEE ARTHROPLASTY Right 11/26/2013   dr Madelon Lips  . TOTAL KNEE ARTHROPLASTY Right 11/26/2013   Procedure: RIGHT TOTAL KNEE ARTHROPLASTY;  Surgeon: Thera Flake., MD;  Location: [facility] OR;  Service: Orthopedics;  Laterality: Right;    Current Outpatient Medications  Medication Sig Dispense Refill  . apixaban (ELIQUIS) 5 MG TABS tablet Take 1 tablet (5 mg total) 2 (two) times daily by mouth. 180 tablet 1  . aspirin EC 81 MG tablet Take 1 tablet (81 mg total) by mouth daily. 30 tablet 0  . clopidogrel (PLAVIX) 75 MG tablet Take 1 tablet (75 mg total) by mouth daily. 30 tablet 0  . cyclobenzaprine (FLEXERIL) 10 MG tablet Take 10 mg by mouth 3 (three) times daily as needed for muscle spasms.    . isosorbide mononitrate (IMDUR) 30 MG 24 hr tablet Take 1 tablet (30 mg total) by mouth daily. 90 tablet 3  . lisinopril (PRINIVIL,ZESTRIL) 20 MG tablet Take 1 tablet by mouth daily.    . nitroGLYCERIN (NITROSTAT) 0.4 MG SL tablet Place 1 tablet (0.4 mg total) under the tongue every 5 (five) minutes as needed. 25 tablet 2  . prednisoLONE acetate (PRED FORTE) 1 % ophthalmic suspension Place 1 drop into the left eye every morning.     No current facility-administered medications for this visit.     Allergies:   Nucynta [tapentadol]; Codeine; Hydrocodone; and Oxycodone    Social History:   The patient  reports that he quit smoking about 3 years ago. His smoking use included cigarettes. He smoked 1.00 pack per day. he has never used smokeless tobacco. He reports that he drinks alcohol. He reports that he does not use drugs.   Family History:  The patient's family history includes Cancer in his father and unknown relative; Heart disease in his mother and unknown relative; Hypertension in his unknown relative.    ROS:  Please see the history of present illness. All other systems are reviewed and negative.    PHYSICAL EXAM: VS:  BP 100/67   Pulse 72   Ht 6\' 4"  (1.93 m)  Wt 292 lb 6.4 oz (132.6 kg)   BMI 35.59 kg/m  , BMI Body mass index is 35.59 kg/m. GEN: Well nourished, well developed, male in no acute distress  HEENT: normal for age  Neck: no JVD, no carotid bruit, no masses Cardiac: RRR; no murmur, no rubs, or gallops Respiratory:  clear to auscultation bilaterally, normal work of breathing GI: soft, nontender, nondistended, + BS MS: no deformity or atrophy; 1+ pedal edema; distal pulses are 2+ in all 4 extremities   Skin: warm and dry, no rash Neuro:  Strength and sensation are intact Psych: euthymic mood, full affect   EKG:  EKG is ordered today. The ekg ordered today demonstrates SR, HR 72, non-specific T wave changes, no acute changes.  Cath: 01/13/17 Conclusion    Prox RCA to Mid RCA lesion is 25% stenosed.  Mid Cx lesion is 50% stenosed.  Mid LAD lesion is 75% stenosed.  A drug-eluting stent was successfully placed using a STENT SYNERGY DES 3X24, postdilated to 3.5 mm.  Post intervention, there is a 0% residual stenosis.  Dist LAD lesion is 50% stenosed, appeared unchanged from prior. Lesion is past a very tortuous distal LAD.  LV end diastolic pressure is normal, 13 mm Hg.  The left ventricular ejection fraction is 55-65% by visual estimate.  There is no aortic valve stenosis.  Ao sat 98%; PA sat 69%; CO 5.8 L/min; CI 2.28. Normal PA  pressures. PCWP mean 13 mm Hg.  Continue aggressive secondary prevention. Plan for same day PCI.  Restart Eliquis tomorrow.  Will continue aspirin, Plavix and Eliquis for 30 days. After 30 days, stop aspirin. Continue Plavix and Eliquis together for 6 months. At that point, could consider stopping Plavix. If there is a bleeding issue, could consider stopping Plavix as soon as 3 months. Synergy stent was used so that antiplatelet therapy duration could be shortened if needed.   Diagnostic Diagram       Post-Intervention Diagram          Recent Labs: 12/30/2016: BUN 11; Creatinine, Ser 1.05; Hemoglobin 14.8; Platelets 215; Potassium 4.7; Sodium 139    Lipid Panel    Component Value Date/Time   CHOL 190 05/16/2014 1022   TRIG 144.0 05/16/2014 1022   HDL 40.30 05/16/2014 1022   CHOLHDL 5 05/16/2014 1022   VLDL 28.8 05/16/2014 1022   LDLCALC 121 (H) 05/16/2014 1022   LDLDIRECT 140.0 05/16/2014 1022     Wt Readings from Last 3 Encounters:  01/24/17 292 lb 6.4 oz (132.6 kg)  01/13/17 280 lb (127 kg)  12/30/16 289 lb (131.1 kg)     Other studies Reviewed: Additional studies/ records that were reviewed today include: Office notes, hospital records and testing.  ASSESSMENT AND PLAN:  1.  CAD: He is aware now of what his ischemic symptoms are and has not had any.  He is on good medical therapy with aspirin, Plavix, Imdur and an ACE inhibitor.  He has sublingual nitroglycerin.  He is aware to stop the aspirin after 30 days.  I advised that he was supposed to be on the Plavix for 6 months but he will discuss coming off after 3 months when he follows up.  He is encouraged to do cardiac rehab.  He is to watch for the symptoms that he was having prior to his intervention and let us know if he gets more of them.  I reviewed the 50% lesions that he has for which medical therapy is the best option.  I advised that I did not believe they were giving him symptoms right now and  our focus was going to be on controlling his risk factors so that those never get any worse.  2. DOE, Lower extremity edema: Some of his dyspnea on exertion was an anginal equivalent.  However, he is not compliant with a low-sodium heart healthy diet.  He is encouraged to improve his compliance with a low-sodium, heart healthy diet.  I will not add a diuretic at this time, his blood pressure is on the low side of normal.   Current medicines are reviewed at length with the patient today.  The patient has concerns regarding medicines. Concerns were addressed The following changes have been made:  no change  Labs/ tests ordered today include:   Orders Placed This Encounter  Procedures  . EKG 12-Lead     Disposition:   FU with Dr. Graciela HusbandsKlein  Signed, Theodore Demarkhonda Kasin Tonkinson, PA-C  01/24/2017 3:54 PM    [facility] Phone: [phone]; Fax: [phone]  This note was written with the assistance of speech recognition software. Please excuse any transcriptional errors.

## 2017-01-29 ENCOUNTER — Telehealth (HOSPITAL_COMMUNITY): Payer: Self-pay

## 2017-01-29 NOTE — Telephone Encounter (Signed)
Called and spoke with patient in regards to Cardiac Rehab - Patient is interested in the program. Scheduled orientation for 03/06/2017 at 8:45am. Patient will attend the 9:45am exc class.

## 2017-01-30 ENCOUNTER — Ambulatory Visit (INDEPENDENT_AMBULATORY_CARE_PROVIDER_SITE_OTHER): Payer: BLUE CROSS/BLUE SHIELD | Admitting: *Deleted

## 2017-01-30 DIAGNOSIS — R55 Syncope and collapse: Secondary | ICD-10-CM

## 2017-01-30 NOTE — Progress Notes (Signed)
Carelink Summary Report / Loop Recorder 

## 2017-02-12 ENCOUNTER — Ambulatory Visit: Payer: BLUE CROSS/BLUE SHIELD | Admitting: Physician Assistant

## 2017-02-13 ENCOUNTER — Encounter (HOSPITAL_COMMUNITY): Payer: Self-pay | Admitting: *Deleted

## 2017-02-13 LAB — CUP PACEART REMOTE DEVICE CHECK
MDC IDC PG IMPLANT DT: 20160831
MDC IDC SESS DTM: 20181220134534

## 2017-02-21 ENCOUNTER — Other Ambulatory Visit: Payer: Self-pay | Admitting: Interventional Cardiology

## 2017-02-25 ENCOUNTER — Other Ambulatory Visit: Payer: Self-pay | Admitting: Physical Medicine and Rehabilitation

## 2017-02-25 DIAGNOSIS — M7918 Myalgia, other site: Secondary | ICD-10-CM

## 2017-02-25 DIAGNOSIS — M545 Low back pain: Secondary | ICD-10-CM

## 2017-02-26 ENCOUNTER — Telehealth (HOSPITAL_COMMUNITY): Payer: Self-pay | Admitting: Pharmacist

## 2017-03-03 ENCOUNTER — Ambulatory Visit (INDEPENDENT_AMBULATORY_CARE_PROVIDER_SITE_OTHER): Payer: BLUE CROSS/BLUE SHIELD | Admitting: *Deleted

## 2017-03-03 DIAGNOSIS — R55 Syncope and collapse: Secondary | ICD-10-CM

## 2017-03-03 NOTE — Progress Notes (Signed)
Carelink Summary Report / Loop Recorder 

## 2017-03-04 ENCOUNTER — Telehealth (HOSPITAL_COMMUNITY): Payer: Self-pay

## 2017-03-04 NOTE — Telephone Encounter (Signed)
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 Patient does not want to reschedule. Cancelled all appts and closed referral.

## 2017-03-04 NOTE — Telephone Encounter (Signed)
Cardiac Rehab - Pharmacy Resident Documentation   Patient cancelled cardiac rehab appointment.   .Marland Kitchen

## 2017-03-06 ENCOUNTER — Ambulatory Visit (HOSPITAL_COMMUNITY): Payer: BLUE CROSS/BLUE SHIELD

## 2017-03-07 ENCOUNTER — Ambulatory Visit
Admission: RE | Admit: 2017-03-07 | Discharge: 2017-03-07 | Disposition: A | Discharge status: Home / Self Care | Payer: BLUE CROSS/BLUE SHIELD | Source: Ambulatory Visit | Attending: Physical Medicine and Rehabilitation | Admitting: Physical Medicine and Rehabilitation

## 2017-03-07 DIAGNOSIS — M545 Low back pain: Secondary | ICD-10-CM

## 2017-03-07 DIAGNOSIS — M7918 Myalgia, other site: Secondary | ICD-10-CM

## 2017-03-10 ENCOUNTER — Ambulatory Visit (HOSPITAL_COMMUNITY): Payer: BLUE CROSS/BLUE SHIELD

## 2017-03-10 LAB — CUP PACEART REMOTE DEVICE CHECK
Implantable Pulse Generator Implant Date: 20160831
MDC IDC SESS DTM: 20190119151146

## 2017-03-12 ENCOUNTER — Ambulatory Visit (HOSPITAL_COMMUNITY): Payer: BLUE CROSS/BLUE SHIELD

## 2017-03-14 ENCOUNTER — Ambulatory Visit (HOSPITAL_COMMUNITY): Payer: BLUE CROSS/BLUE SHIELD

## 2017-03-17 ENCOUNTER — Ambulatory Visit (HOSPITAL_COMMUNITY): Payer: BLUE CROSS/BLUE SHIELD

## 2017-03-19 ENCOUNTER — Ambulatory Visit (HOSPITAL_COMMUNITY): Payer: BLUE CROSS/BLUE SHIELD

## 2017-03-21 ENCOUNTER — Ambulatory Visit (HOSPITAL_COMMUNITY): Payer: BLUE CROSS/BLUE SHIELD

## 2017-03-24 ENCOUNTER — Ambulatory Visit (HOSPITAL_COMMUNITY): Payer: BLUE CROSS/BLUE SHIELD

## 2017-03-26 ENCOUNTER — Telehealth: Payer: Self-pay | Admitting: Cardiology

## 2017-03-26 ENCOUNTER — Ambulatory Visit (HOSPITAL_COMMUNITY): Payer: BLUE CROSS/BLUE SHIELD

## 2017-03-26 NOTE — Telephone Encounter (Signed)
Spoke w/ pt and requested that he send a manual transmission b/c his home monitor has not updated in at least 14 days.   

## 2017-03-27 ENCOUNTER — Telehealth: Payer: Self-pay | Admitting: *Deleted

## 2017-03-27 NOTE — Telephone Encounter (Signed)
Spoke with patient regarding symptoms associated with symptom episode. ECG appears short burst of SVT. Patient states he had brief episode of CP, no SOB, No syncope, no dizziness or lightheadedness. Patient states he is taking his medication as prescribed. Advised patient will place in Dr. Odessa FlemingKlein's folder for review and call back if any further recommendations.

## 2017-03-28 ENCOUNTER — Ambulatory Visit (HOSPITAL_COMMUNITY): Payer: BLUE CROSS/BLUE SHIELD

## 2017-03-31 ENCOUNTER — Ambulatory Visit (HOSPITAL_COMMUNITY): Payer: BLUE CROSS/BLUE SHIELD

## 2017-03-31 ENCOUNTER — Ambulatory Visit (INDEPENDENT_AMBULATORY_CARE_PROVIDER_SITE_OTHER): Payer: BLUE CROSS/BLUE SHIELD | Admitting: *Deleted

## 2017-03-31 DIAGNOSIS — R55 Syncope and collapse: Secondary | ICD-10-CM

## 2017-04-01 NOTE — Progress Notes (Signed)
Carelink Summary Report / Loop Recorder 

## 2017-04-02 ENCOUNTER — Ambulatory Visit (HOSPITAL_COMMUNITY): Payer: BLUE CROSS/BLUE SHIELD

## 2017-04-04 ENCOUNTER — Ambulatory Visit (HOSPITAL_COMMUNITY): Payer: BLUE CROSS/BLUE SHIELD

## 2017-04-07 ENCOUNTER — Ambulatory Visit (HOSPITAL_COMMUNITY): Payer: BLUE CROSS/BLUE SHIELD

## 2017-04-09 ENCOUNTER — Ambulatory Visit (HOSPITAL_COMMUNITY): Payer: BLUE CROSS/BLUE SHIELD

## 2017-04-11 ENCOUNTER — Ambulatory Visit (HOSPITAL_COMMUNITY): Payer: BLUE CROSS/BLUE SHIELD

## 2017-04-14 ENCOUNTER — Ambulatory Visit (HOSPITAL_COMMUNITY): Payer: BLUE CROSS/BLUE SHIELD

## 2017-04-14 ENCOUNTER — Encounter: Payer: Self-pay | Admitting: Interventional Cardiology

## 2017-04-16 ENCOUNTER — Ambulatory Visit (HOSPITAL_COMMUNITY): Payer: BLUE CROSS/BLUE SHIELD

## 2017-04-18 ENCOUNTER — Ambulatory Visit (HOSPITAL_COMMUNITY): Payer: BLUE CROSS/BLUE SHIELD

## 2017-04-19 NOTE — Progress Notes (Signed)
Cardiology Office Note   Date:  04/21/2017   ID:  Mahmood, Boehringer 1955/12/09, MRN [id number]  PCP:  Kaleen Mask, MD    No chief complaint on file.  AFib, CAD  Wt Readings from Last 3 Encounters:  04/21/17 299 lb (135.6 kg)  01/24/17 292 lb 6.4 oz (132.6 kg)  01/13/17 280 lb (127 kg)       History of Present Illness: Curtis Barton is a 62 y.o. male  With a h/o AFib.  He was diagnosed with CAD in 2018.  He had a cath in 12/18:  Prox RCA to Mid RCA lesion is 25% stenosed.  Mid Cx lesion is 50% stenosed.  Mid LAD lesion is 75% stenosed.  A drug-eluting stent was successfully placed using a STENT SYNERGY DES 3X24, postdilated to 3.5 mm.  Post intervention, there is a 0% residual stenosis.  Dist LAD lesion is 50% stenosed, appeared unchanged from prior. Lesion is past a very tortuous distal LAD.  LV end diastolic pressure is normal, 13 mm Hg.  The left ventricular ejection fraction is 55-65% by visual estimate.  There is no aortic valve stenosis.  Ao sat 98%; PA sat 69%; CO 5.8 L/min; CI 2.28. Normal PA pressures. PCWP mean 13 mm Hg.  JR5, JL4 used from the right radial artery.   Continue aggressive secondary prevention.  Plan for same day PCI.  Restart Eliquis tomorrow.  Will continue aspirin, Plavix and Eliquis for 30 days.  After 30 days, stop aspirin.  Continue Plavix and Eliquis together for 6 months.  At that point, could consider stopping Plavix.  If there is a bleeding issue, could consider  Stopping Plavix as soon as 3 months.  Synergy stent was used so that antiplatelet therapy duration could be shortened if needed.  He has had some flutters in his chest.  He has an implantable loop recorder.  Denies : Exertional Chest pain. Dizziness. Leg edema. Nitroglycerin use. Orthopnea. Paroxysmal nocturnal dyspnea.  Syncope.   He reports some DOE.   He bleeds easily with the Eliquis and Plavix.  He would like to come off of Plavix.  He  has already stopped his aspirin.    Past Medical History:  Diagnosis Date  . Arthritis   . Asthma    as child- no problem  . Atrial flutter (HCC)    a. s/p ablation  . CAD (coronary artery disease)    12/18 PCI/DES x1 to mLAD, normal EF  . Difficult intubation    maybe 8 years years ago, no trouble since then  . Fatty liver   . Fuchs' uveitis syndrome    left eye  . History of kidney stones   . Hypertension   . Primary localized osteoarthritis of right knee   . Sleep apnea    home sleep study shows some sleep apnea  . Stroke (HCC)   . TIA (transient ischemic attack) 2010    Past Surgical History:  Procedure Laterality Date  . acle repair Right 2000   ACL repair  . ATRIAL ABLATION SURGERY  10/2013   Dr Graciela Husbands  . ATRIAL FLUTTER ABLATION N/A 10/13/2013   Procedure: ATRIAL FLUTTER ABLATION;  Surgeon: Duke Salvia, MD;  Location: [facility] CATH LAB;  Service: Cardiovascular;  Laterality: N/A;  . BICEPS TENDON REPAIR    . CATARACT EXTRACTION W/ INTRAOCULAR LENS  IMPLANT, BILATERAL    . CORONARY STENT INTERVENTION N/A 01/13/2017   Procedure: CORONARY STENT INTERVENTION;  Surgeon: Eldridge Dace,  Donnie CoffinJayadeep S, MD;  Location: [facility] INVASIVE CV LAB;  Service: Cardiovascular;  Laterality: N/A;  . CYSTOSCOPY W/ URETERAL STENT PLACEMENT  1999  . EP IMPLANTABLE DEVICE N/A 10/12/2014   Procedure: Loop Recorder Insertion;  Surgeon: Duke SalviaSteven C Klein, MD;  [facility];  Service: Cardiovascular;  Laterality: N/A;  . EYE SURGERY    . HERNIA REPAIR    . KIDNEY SURGERY Right   . KNEE CARTILAGE SURGERY Bilateral   . LEFT HEART CATHETERIZATION WITH CORONARY ANGIOGRAM N/A 05/17/2014   Procedure: LEFT HEART CATHETERIZATION WITH CORONARY ANGIOGRAM;  Surgeon: Corky CraftsJayadeep S Brigitta Pricer, MD;  [facility];  Service: Cardiovascular;  Laterality: N/A;  . RIGHT/LEFT HEART CATH AND CORONARY ANGIOGRAPHY N/A 01/13/2017   Procedure: RIGHT/LEFT HEART CATH AND CORONARY ANGIOGRAPHY;  Surgeon: Corky CraftsVaranasi, Novaleigh Kohlman  S, MD;  [facility];  Service: Cardiovascular;  Laterality: N/A;  . ROTATOR CUFF REPAIR Right 2005  . SHOULDER ARTHROSCOPY WITH ROTATOR CUFF REPAIR Right 11/07/2015   Procedure: SHOULDER ARTHROSCOPY WITH ROTATOR CUFF REPAIR AND DEBRIDEMENT with removal of loose bodies (suture);  Surgeon: Jones BroomJustin Chandler, MD;  [facility];  Service: Orthopedics;  Laterality: Right;  . TONSILLECTOMY    . TOTAL HIP ARTHROPLASTY Right 12/02/2014   Procedure: TOTAL HIP ARTHROPLASTY;  Surgeon: Frederico Hammananiel Caffrey, MD;  [facility];  Service: Orthopedics;  Laterality: Right;  . TOTAL KNEE ARTHROPLASTY Right 11/26/2013   dr Madelon Lipscaffrey  . TOTAL KNEE ARTHROPLASTY Right 11/26/2013   Procedure: RIGHT TOTAL KNEE ARTHROPLASTY;  Surgeon: Thera FlakeW D Caffrey Jr., MD;  Location: [facility] OR;  Service: Orthopedics;  Laterality: Right;     Current Outpatient Medications  Medication Sig Dispense Refill  . apixaban (ELIQUIS) 5 MG TABS tablet Take 1 tablet (5 mg total) 2 (two) times daily by mouth. 180 tablet 1  . aspirin EC 81 MG tablet Take 1 tablet (81 mg total) by mouth daily. 30 tablet 0  . clopidogrel (PLAVIX) 75 MG tablet TAKE 1 TABLET BY MOUTH ONCE DAILY 90 tablet 3  . cyclobenzaprine (FLEXERIL) 10 MG tablet Take 10 mg by mouth 3 (three) times daily as needed for muscle spasms.    Marland Kitchen. lisinopril (PRINIVIL,ZESTRIL) 20 MG tablet Take 1 tablet by mouth daily.    . nitroGLYCERIN (NITROSTAT) 0.4 MG SL tablet Place 1 tablet (0.4 mg total) under the tongue every 5 (five) minutes as needed. 25 tablet 2  . prednisoLONE acetate (PRED FORTE) 1 % ophthalmic suspension Place 1 drop into the left eye every morning.    . isosorbide mononitrate (IMDUR) 30 MG 24 hr tablet Take 1 tablet (30 mg total) by mouth daily. 90 tablet 3   No current facility-administered medications for this visit.     Allergies:   Nucynta [tapentadol]; Codeine; Hydrocodone; and Oxycodone    Social History:  The patient  reports that he quit smoking about 3  years ago. His smoking use included cigarettes. He smoked 1.00 pack per day. he has never used smokeless tobacco. He reports that he drinks alcohol. He reports that he does not use drugs.   Family History:  The patient's family history includes Cancer in his father and unknown relative; Heart disease in his mother and unknown relative; Hypertension in his unknown relative.    ROS:  Please see the history of present illness.   Otherwise, review of systems are positive for chronic scratches on legs.   All other systems are reviewed and negative.    PHYSICAL EXAM: VS:  BP  108/62   Pulse 73   Ht 6\' 4"  (1.93 m)   Wt 299 lb (135.6 kg)   SpO2 95%   BMI 36.40 kg/m  , BMI Body mass index is 36.4 kg/m. GEN: Well nourished, well developed, in no acute distress  HEENT: normal  Neck: no JVD, carotid bruits, or masses Cardiac: RRR; no murmurs, rubs, or gallops,no edema  Respiratory:  clear to auscultation bilaterally, normal work of breathing GI: soft, nontender, nondistended, + BS MS: no deformity or atrophy  Skin: warm and dry, no rash; scratches on legs Neuro:  Strength and sensation are intact Psych: euthymic mood, full affect   Recent Labs: 12/30/2016: BUN 11; Creatinine, Ser 1.05; Hemoglobin 14.8; Platelets 215; Potassium 4.7; Sodium 139   Lipid Panel    Component Value Date/Time   CHOL 190 05/16/2014 1022   TRIG 144.0 05/16/2014 1022   HDL 40.30 05/16/2014 1022   CHOLHDL 5 05/16/2014 1022   VLDL 28.8 05/16/2014 1022   LDLCALC 121 (H) 05/16/2014 1022   LDLDIRECT 140.0 05/16/2014 1022     Other studies Reviewed: Additional studies/ records that were reviewed today with results demonstrating: 2016 lipids reviewed.   ASSESSMENT AND PLAN:  1. CAD: No angina on medical therapy since an episode of atypical pain immediately post stent.  Continue aggressive secondary prevention.  We discussed his antiplatelet therapy at length.  Due to requiring a spinal injection, he would like to  come off of his [place] grill.  We did discuss the risks of stent thrombosis.  He has completed 3 months of therapy with clopidogrel.  He remains on Eliquis.  He has issues with bleeding easily [place]  He will contact his surgeon who does the spinal injection.  He can stop the Plavix 5 days prior to the injection.  He does not have to restart Plavix after the injection.  He will restart Eliquis though after the injection.  Synergy stent was used specifically so that antiplatelet therapy duration could be shortened. 2. DOE: Continue to try to increase exercise.  No symptoms like prior to his stent placement.  No exertional chest discomfort.  3. Hyperlipidemia: Start atorvastatin 20 mg today.  LDL 140 in 2016.  Now with CAD, so LDL should be below 100.  He is agreeable to starting a statin.  Will have him get lipids checked with his primary care doctor since that is closer to his home.  Results will be sent to [place].   Current medicines are reviewed at length with the patient today.  The patient concerns regarding his medicines were addressed.  The following changes have been made: Start atorvastatin  Labs/ tests ordered today include: Lipids and liver to be checked in the next 2-3 months No orders of the defined types were placed in this encounter.   Recommend 150 minutes/week of aerobic exercise Low fat, low carb, high fiber diet recommended  Disposition:   FU in 6 months   Signed, Lance Muss, MD  04/21/2017 10:24 AM    [facility] [address] [place], [place], [place]  [address] Phone: [phone]; Fax: [phone]

## 2017-04-21 ENCOUNTER — Encounter: Payer: Self-pay | Admitting: Interventional Cardiology

## 2017-04-21 ENCOUNTER — Ambulatory Visit (INDEPENDENT_AMBULATORY_CARE_PROVIDER_SITE_OTHER): Payer: BLUE CROSS/BLUE SHIELD | Admitting: Interventional Cardiology

## 2017-04-21 ENCOUNTER — Ambulatory Visit (HOSPITAL_COMMUNITY): Payer: BLUE CROSS/BLUE SHIELD

## 2017-04-21 VITALS — BP 108/62 | HR 73 | Ht 76.0 in | Wt 299.0 lb

## 2017-04-21 DIAGNOSIS — I25118 Atherosclerotic heart disease of native coronary artery with other forms of angina pectoris: Secondary | ICD-10-CM | POA: Diagnosis not present

## 2017-04-21 DIAGNOSIS — Z8679 Personal history of other diseases of the circulatory system: Secondary | ICD-10-CM | POA: Diagnosis not present

## 2017-04-21 DIAGNOSIS — I10 Essential (primary) hypertension: Secondary | ICD-10-CM | POA: Diagnosis not present

## 2017-04-21 DIAGNOSIS — Z955 Presence of coronary angioplasty implant and graft: Secondary | ICD-10-CM | POA: Diagnosis not present

## 2017-04-21 DIAGNOSIS — Z9889 Other specified postprocedural states: Secondary | ICD-10-CM

## 2017-04-21 MED ORDER — ATORVASTATIN CALCIUM 20 MG PO TABS: 20.0000 mg | ORAL_TABLET | Freq: Every day | ORAL | 3 refills | Status: DC

## 2017-04-21 NOTE — Patient Instructions (Addendum)
Medication Instructions:  Your physician has recommended you make the following change in your medication:   1. START: atorvastatin 20 mg daily  2. CONTINUE: plavix until 5 days prior to your injection, you may then stop taking plavix altogether  Labwork: Your physician recommends that you have a FASTING lipid profile and liver function test in 3 months at your primary car doctor's office   Testing/Procedures: None ordered  Follow-Up: Your physician wants you to follow-up in: 6 months with Dr. Eldridge DaceVaranasi. You will receive a reminder letter in the mail two months in advance. If you don't receive a letter, please call our office to schedule the follow-up appointment.   Any Other Special Instructions Will Be Listed Below (If Applicable).     If you need a refill on your cardiac medications before your next appointment, please call your pharmacy.

## 2017-04-23 ENCOUNTER — Encounter: Payer: Self-pay | Admitting: Interventional Cardiology

## 2017-04-23 ENCOUNTER — Ambulatory Visit (HOSPITAL_COMMUNITY): Payer: BLUE CROSS/BLUE SHIELD

## 2017-04-24 ENCOUNTER — Telehealth: Payer: Self-pay | Admitting: *Deleted

## 2017-04-24 DIAGNOSIS — I10 Essential (primary) hypertension: Secondary | ICD-10-CM

## 2017-04-24 DIAGNOSIS — I471 Supraventricular tachycardia: Secondary | ICD-10-CM

## 2017-04-24 MED ORDER — METOPROLOL SUCCINATE ER 25 MG PO TB24: 25.0000 mg | ORAL_TABLET | Freq: Every day | ORAL | 3 refills | Status: DC

## 2017-04-24 MED ORDER — LISINOPRIL 10 MG PO TABS: 10.0000 mg | ORAL_TABLET | Freq: Every day | ORAL | 3 refills | Status: DC

## 2017-04-24 NOTE — Telephone Encounter (Signed)
LMOVM requesting call back to the [facility]. 

## 2017-04-24 NOTE — Telephone Encounter (Signed)
Discussed symptom episode on LINQ from 04/20/17 at 1526.  Patient reports similar symptoms on Friday evening as well.  He experienced dizziness during an episode in which his heart "felt like it was beating against itself."  These episodes have occurred a few times in the past week and each time he feels dizzy.  Symptoms only last a short duration.  He will send a manual transmission for review of full ECG by Dr. Graciela HusbandsKlein this afternoon.  Patient is appreciative and denies additional questions or concerns at this time.

## 2017-04-24 NOTE — Telephone Encounter (Signed)
Per Dr. Graciela HusbandsKlein, symptom episode indicates brief SVT--possibly A-tach.  Plan to DECREASE lisinopril to 10mg  daily and START metoprolol succinate 25mg  daily.  Advised patient of Dr. Odessa FlemingKlein's recommendations.  Patient will plan to finish current bottle of lisinopril 20mg  and split pills in half.  He will pick up metoprolol today.  Updated prescriptions sent electronically to preferred pharmacy.  Patient verbalizes understanding of all instructions.  Educated patient about signs/symptoms of hypotension and encouraged him to contact our office if noted.  Patient is agreeable to f/u with Gypsy BalsamAmber Seiler, NP, in 6 weeks per Dr. Odessa FlemingKlein's recommendation.  Will request that scheduler reach out to patient with appointment.  Patient is appreciative of assistance and denies additional questions or concerns at this time.

## 2017-04-25 ENCOUNTER — Ambulatory Visit (HOSPITAL_COMMUNITY): Payer: BLUE CROSS/BLUE SHIELD

## 2017-04-28 ENCOUNTER — Ambulatory Visit (HOSPITAL_COMMUNITY): Payer: BLUE CROSS/BLUE SHIELD

## 2017-04-30 ENCOUNTER — Ambulatory Visit (HOSPITAL_COMMUNITY): Payer: BLUE CROSS/BLUE SHIELD

## 2017-04-30 LAB — CUP PACEART REMOTE DEVICE CHECK
MDC IDC PG IMPLANT DT: 20160831
MDC IDC SESS DTM: 20190219124923

## 2017-05-02 ENCOUNTER — Ambulatory Visit (HOSPITAL_COMMUNITY): Payer: BLUE CROSS/BLUE SHIELD

## 2017-05-05 ENCOUNTER — Ambulatory Visit (HOSPITAL_COMMUNITY): Payer: BLUE CROSS/BLUE SHIELD

## 2017-05-05 ENCOUNTER — Ambulatory Visit (INDEPENDENT_AMBULATORY_CARE_PROVIDER_SITE_OTHER): Payer: BLUE CROSS/BLUE SHIELD | Admitting: *Deleted

## 2017-05-05 DIAGNOSIS — R55 Syncope and collapse: Secondary | ICD-10-CM

## 2017-05-05 NOTE — Progress Notes (Signed)
Carelink Summary Report / Loop Recorder 

## 2017-05-07 ENCOUNTER — Ambulatory Visit (HOSPITAL_COMMUNITY): Payer: BLUE CROSS/BLUE SHIELD

## 2017-05-07 ENCOUNTER — Other Ambulatory Visit: Payer: Self-pay | Admitting: Internal Medicine

## 2017-05-09 ENCOUNTER — Ambulatory Visit (HOSPITAL_COMMUNITY): Payer: BLUE CROSS/BLUE SHIELD

## 2017-05-12 ENCOUNTER — Ambulatory Visit (HOSPITAL_COMMUNITY): Payer: BLUE CROSS/BLUE SHIELD

## 2017-05-13 ENCOUNTER — Telehealth: Payer: Self-pay

## 2017-05-13 NOTE — Telephone Encounter (Signed)
   [facility] Pre-operative Risk Assessment    Request for surgical clearance:  1. What type of surgery is being performed? Left l5-s1 and Epidural Injection   2. When is this surgery scheduled? tbd   3. What type of clearance is required (medical clearance vs. Pharmacy clearance to hold med vs. Both)? pharmacy  4. Are there any medications that need to be held prior to surgery and how long?eliquis   5. Practice name and name of physician performing surgery? [facility]/ [place] [facility]   6. What is your office phone and fax number? [phone]  [phone]   7. Anesthesia type (None, local, MAC, general) ? none   Curtis Barton 05/13/2017, 12:51 PM  _________________________________________________________________   (provider comments below)

## 2017-05-14 ENCOUNTER — Ambulatory Visit (HOSPITAL_COMMUNITY): Payer: BLUE CROSS/BLUE SHIELD

## 2017-05-15 ENCOUNTER — Other Ambulatory Visit: Payer: Self-pay | Admitting: Internal Medicine

## 2017-05-15 NOTE — Telephone Encounter (Signed)
Patient with diagnosis of atrial flutter on Eliquis for anticoagulation.    Procedure: Left I5-s1 and epidural injection  Date of procedure: TBD  CHADS2-VASc score of  4 ( HTN,, stroke/tia x 2, CAD, )  CrCl 141.7 Platelet count 215  Per office protocol, patient can hold Eliquis for 3 days prior to procedure.    Patient should restart Eliquis on the evening of procedure or day after, at discretion of procedure MD

## 2017-05-16 ENCOUNTER — Ambulatory Visit (HOSPITAL_COMMUNITY): Payer: BLUE CROSS/BLUE SHIELD

## 2017-05-16 NOTE — Telephone Encounter (Signed)
[facility]

## 2017-05-19 ENCOUNTER — Ambulatory Visit (HOSPITAL_COMMUNITY): Payer: BLUE CROSS/BLUE SHIELD

## 2017-05-21 ENCOUNTER — Encounter: Payer: Self-pay | Admitting: Interventional Cardiology

## 2017-05-21 ENCOUNTER — Ambulatory Visit (HOSPITAL_COMMUNITY): Payer: BLUE CROSS/BLUE SHIELD

## 2017-05-21 NOTE — Telephone Encounter (Signed)
Per Dr. Hoyle BarrVaranasi's last OV note on 04/21/17 (see below), the patient can hold his plavix 5 days prior to his spinal injection and does not have to restart it after. Patient will need to resume his Eliquis after the procedure.  "ASSESSMENT AND PLAN:  1. CAD: No angina on medical therapy since an episode of atypical pain immediately post stent.  Continue aggressive secondary prevention.  We discussed his antiplatelet therapy at length.  Due to requiring a spinal injection, he would like to come off of his [place] grill.  We did discuss the risks of stent thrombosis.  He has completed 3 months of therapy with clopidogrel.  He remains on Eliquis.  He has issues with bleeding easily [place].  He will contact his surgeon who does the spinal injection.  He can stop the Plavix 5 days prior to the injection.  He does not have to restart Plavix after the injection.  He will restart Eliquis though after the injection.  Synergy stent was used specifically so that antiplatelet therapy duration could be shortened."   Re-faxed Clearance and OV note to Dr. Maurice SmallIbazebo at [phone] via Epic.

## 2017-05-23 ENCOUNTER — Ambulatory Visit (HOSPITAL_COMMUNITY): Payer: BLUE CROSS/BLUE SHIELD

## 2017-05-26 ENCOUNTER — Ambulatory Visit (HOSPITAL_COMMUNITY): Payer: BLUE CROSS/BLUE SHIELD

## 2017-05-28 ENCOUNTER — Ambulatory Visit (HOSPITAL_COMMUNITY): Payer: BLUE CROSS/BLUE SHIELD

## 2017-05-30 ENCOUNTER — Ambulatory Visit (HOSPITAL_COMMUNITY): Payer: BLUE CROSS/BLUE SHIELD

## 2017-06-06 ENCOUNTER — Ambulatory Visit (INDEPENDENT_AMBULATORY_CARE_PROVIDER_SITE_OTHER): Payer: BLUE CROSS/BLUE SHIELD | Admitting: *Deleted

## 2017-06-06 DIAGNOSIS — R55 Syncope and collapse: Secondary | ICD-10-CM

## 2017-06-06 NOTE — Progress Notes (Signed)
Carelink Summary Report / Loop Recorder 

## 2017-06-11 ENCOUNTER — Encounter: Payer: Self-pay | Admitting: Internal Medicine

## 2017-06-13 LAB — CUP PACEART REMOTE DEVICE CHECK
Date Time Interrogation Session: 20190324134025
MDC IDC PG IMPLANT DT: 20160831

## 2017-06-27 IMAGING — MR MR LUMBAR SPINE W/O CM
4 of 5 series · 25 of 48 positions shown · non-contrast
Comparison: CT abdomen and pelvis 11/17/2014

CLINICAL DATA: Low back pain extending into the right lower
extremity. Numbness in the thigh when patient coughs or sneezes.

EXAM:
MRI LUMBAR SPINE WITHOUT CONTRAST
TECHNIQUE: Multiplanar, multisequence MR imaging of the lumbar spine was
performed. No intravenous contrast was administered.

[Series 3: T2 · sagittal · 4.0mm · 0.55mm/px · 6 of 16 slices shown (1 of 2)]
[im 1/16]
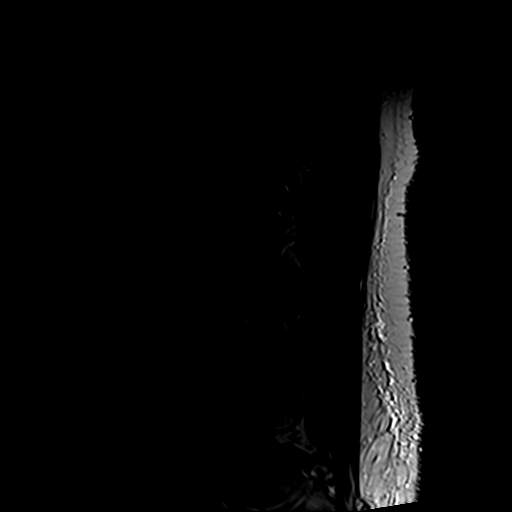
[im 4/16]
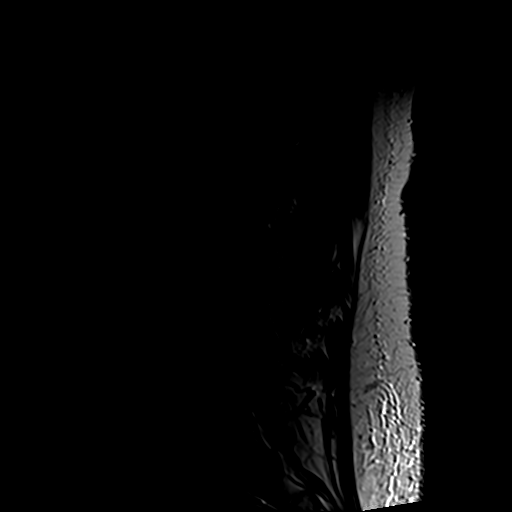
[im 7/16]
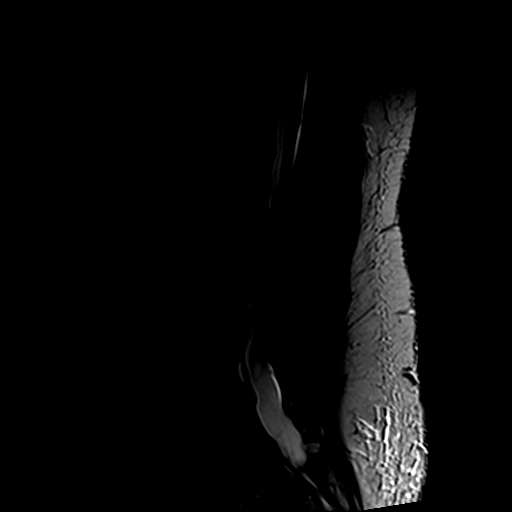
[im 10/16]
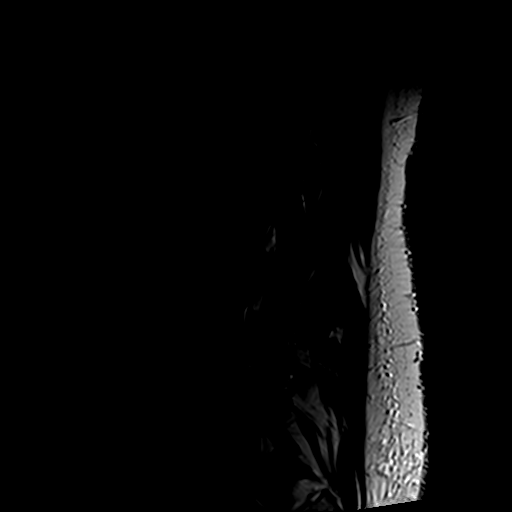
[im 13/16]
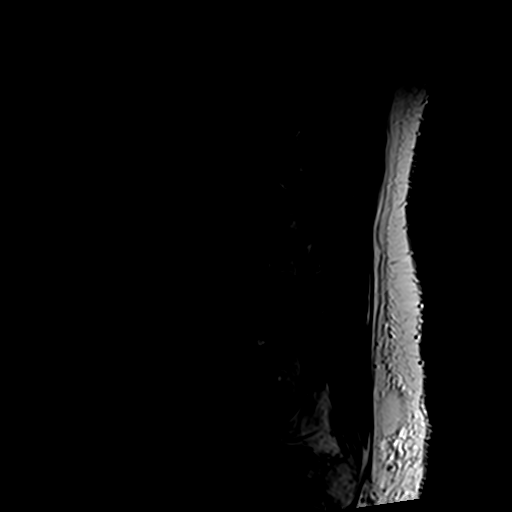
[im 16/16]
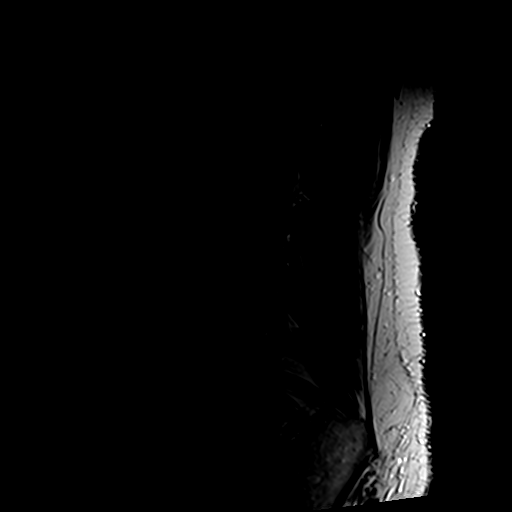

[Series 4: T1 · sagittal · 4.0mm · 0.55mm/px · 6 of 16 slices shown (1 of 2)]
[im 1/16]
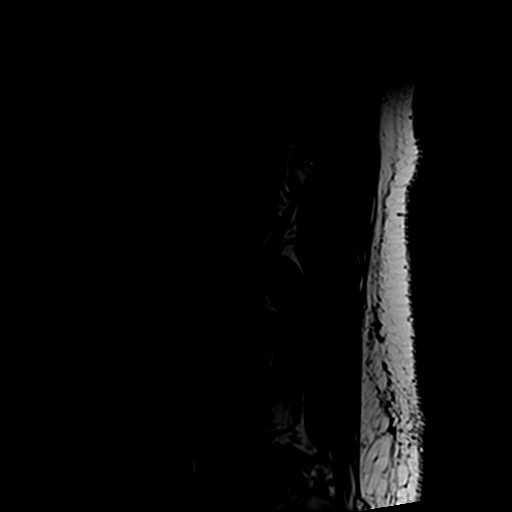
[im 4/16]
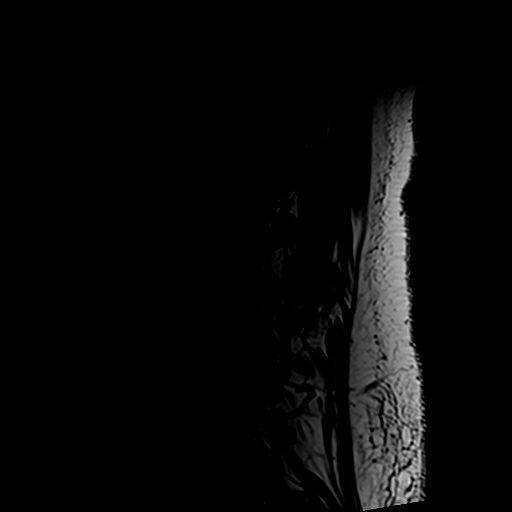
[im 7/16]
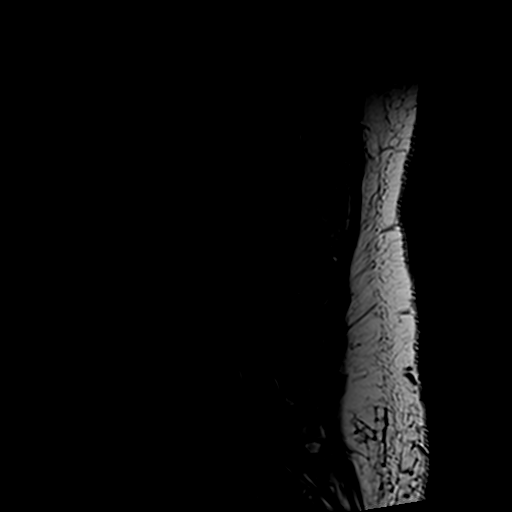
[im 10/16]
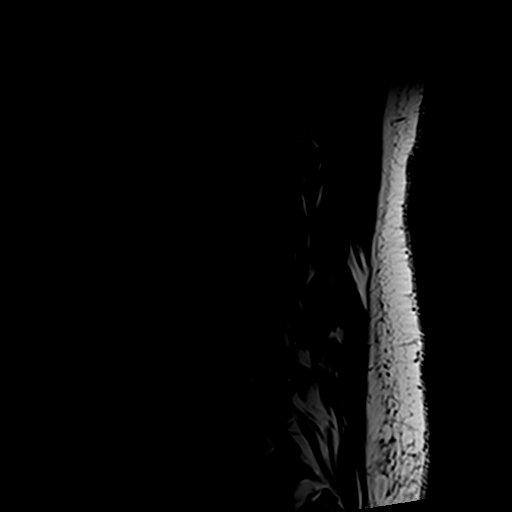
[im 13/16]
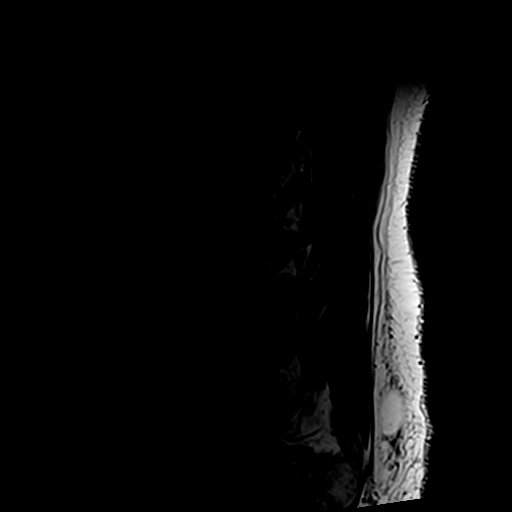
[im 16/16]
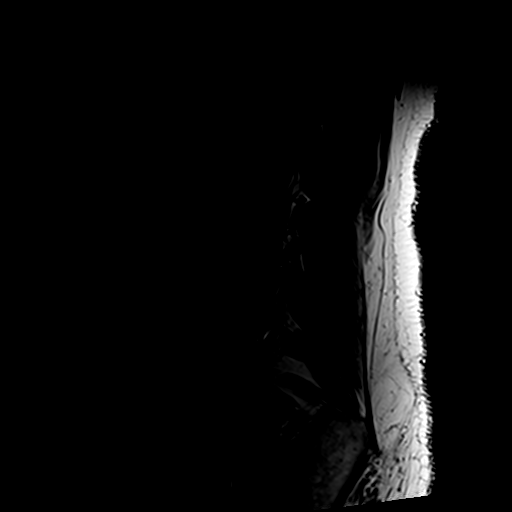

[Series 6: T2 · axial · 4.0mm · 0.70mm/px · z∈[-62,+139]mm · 9 of 39 slices shown (2 of 2)]
[im 1/39]
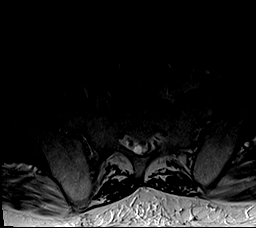
[im 6/39]
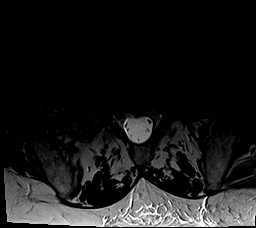
[im 11/39]
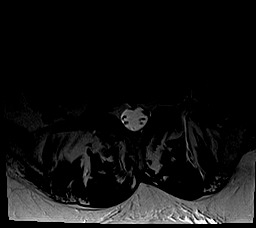
[im 17/39]
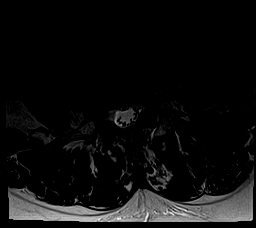
[im 20/39]
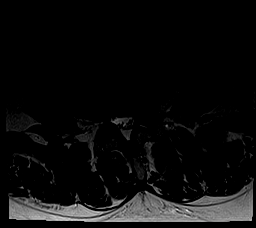
[im 22/39]
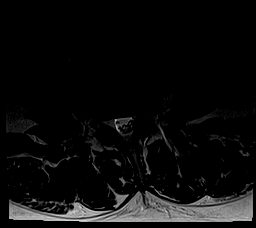
[im 28/39]
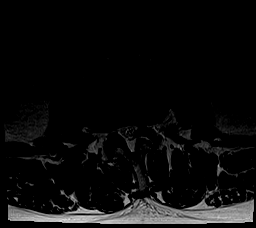
[im 33/39]
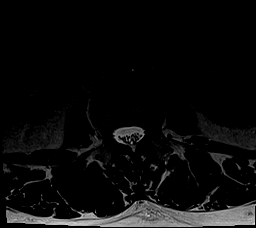
[im 39/39]
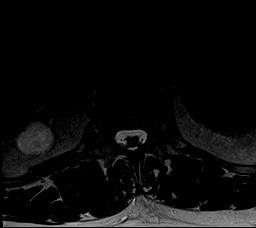

[Series 7: T1 · axial · 4.0mm · 0.35mm/px · z∈[-62,+109]mm · 4 of 39 slices shown (2 of 2)]
[im 1/39]
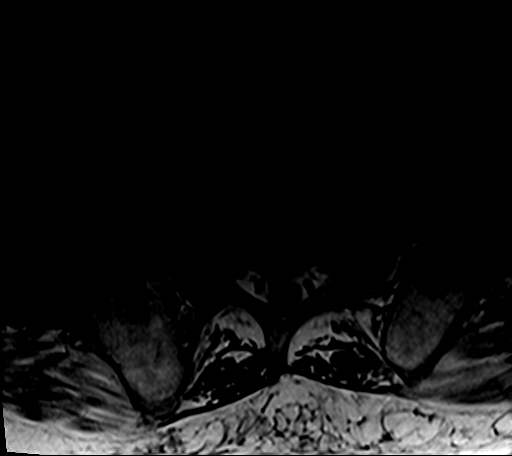
[im 6/39]
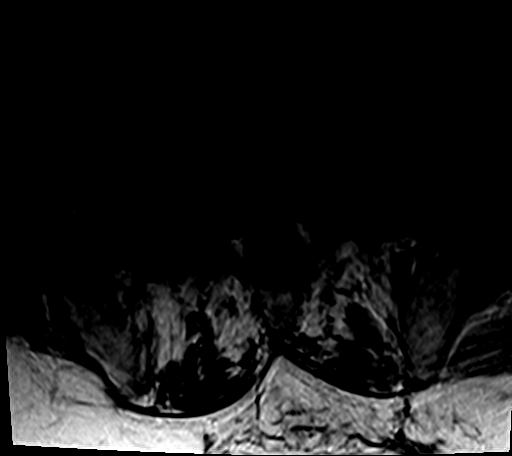
[im 20/39]
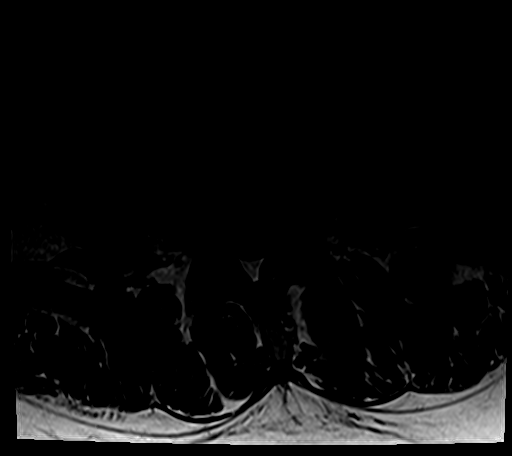
[im 33/39]
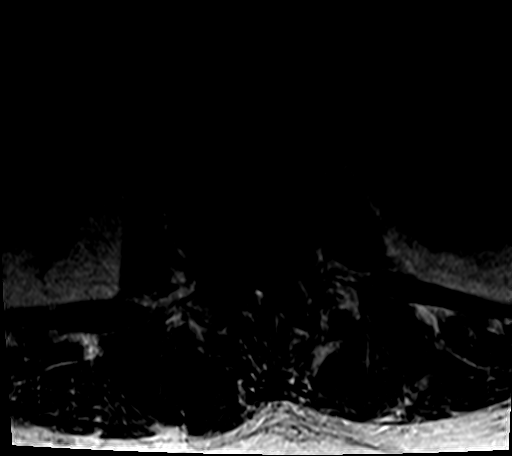

[25 of 48 positions shown; findings below may reference images not displayed]

FINDINGS: Normal signal is present in the conus medullaris which terminates at
L1-2. Slight retrolisthesis at L2-3 and L3-4 is stable. Vertebral
body heights and alignment are otherwise normal. Atrophy of the
right kidney in cystic changes are again noted. Limited imaging of
the abdomen is otherwise unremarkable. There is no significant
adenopathy. Rightward curvature of the lumbar spine is centered at
L3-4.

L1-2:  Negative.

L2-3: A broad-based disc protrusion is present. Moderate facet
hypertrophy is noted bilaterally. Mild subarticular and foraminal
stenosis is present bilaterally.

L3-4: A broad-based disc protrusion is present. Moderate facet
hypertrophy is noted. This leads to moderate left subarticular and
foraminal narrowing. Mild right foraminal stenosis is present.

L4-5: A leftward broad-based disc protrusion is present. Moderate
facet hypertrophy is noted bilaterally. Mild subarticular narrowing
is worse on the right. Moderate foraminal stenosis is worse on the
right.

L5-S1: Asymmetric right-sided facet hypertrophy and spurring is
present. A mild broad-based disc protrusion is present. This results
in mild right subarticular and moderate right foraminal stenosis.
Mild left subarticular and foraminal narrowing is present.
IMPRESSION: 1. Dextro convex curvature of the lumbar spine is centered at L3-4.
2. Slight retrolisthesis at L2-3 and L3-4.
3. Mild subarticular and foraminal narrowing bilaterally at L2-3.
4. Moderate left subarticular and foraminal narrowing at L3-4.
5. Mild subarticular and moderate foraminal stenosis bilaterally at
L4-5 is worse on the right.
6. Mild right subarticular and moderate right foraminal stenosis at
L5-S1.
7. Mild left subarticular and foraminal disease at L5-S1.

## 2017-07-01 LAB — CUP PACEART REMOTE DEVICE CHECK
Implantable Pulse Generator Implant Date: 20160831
MDC IDC SESS DTM: 20190426133855

## 2017-07-02 ENCOUNTER — Encounter: Payer: BLUE CROSS/BLUE SHIELD | Admitting: Physician Assistant

## 2017-07-02 ENCOUNTER — Ambulatory Visit (INDEPENDENT_AMBULATORY_CARE_PROVIDER_SITE_OTHER): Payer: BLUE CROSS/BLUE SHIELD | Admitting: Internal Medicine

## 2017-07-02 ENCOUNTER — Encounter: Payer: Self-pay | Admitting: Internal Medicine

## 2017-07-02 VITALS — BP 122/70 | HR 86 | Ht 76.0 in | Wt 295.0 lb

## 2017-07-02 DIAGNOSIS — I471 Supraventricular tachycardia, unspecified: Secondary | ICD-10-CM

## 2017-07-02 DIAGNOSIS — I483 Typical atrial flutter: Secondary | ICD-10-CM | POA: Diagnosis not present

## 2017-07-02 DIAGNOSIS — R002 Palpitations: Secondary | ICD-10-CM | POA: Diagnosis not present

## 2017-07-02 NOTE — Patient Instructions (Signed)
Medication Instructions:  Your physician has recommended you make the following change in your medication:   1. Stop Isosorbide 2. Stop Metoprolol  Labwork: Please have your lab work sent to our office for review.  Testing/Procedures: None ordered.  Follow-Up: Your physician wants you to follow-up in: One Year with Dr Graciela Husbands. You will receive a reminder letter in the mail two months in advance. If you don't receive a letter, please call our office to schedule the follow-up appointment.   Any Other Special Instructions Will Be Listed Below (If Applicable).     If you need a refill on your cardiac medications before your next appointment, please call your pharmacy.

## 2017-07-02 NOTE — Progress Notes (Signed)
his hip replacement      Patient Care Team: Kaleen Mask, MD as PCP - General (Family Medicine) Corky Crafts, MD as PCP - Cardiology (Cardiology)   HPI  Curtis Barton is a 62 y.o. male Seen in followup for atrial flutter for which he underwent catheter ablation while in hospital 9/15 Also with palps s/p ILR .  Echocardiogram 12/16 at that time demonstrated normal left ventricular function.  CHADS-VASc score was 3 with hypertension and presumed TIAs    Hip  replacement surgery 8/16 ; biceps replacement surgery 12/16. He is struggling with the implications of ongoing medical illnesses.  Because of increasing exercise intolerance he underwent catheterization 12/18 and then stenting.  Exercise tolerance is been better.    He continues to have episodes of palpitations.  He also has a sensation that at the end of the event that there is a pause.  He was started on metoprolol.  This is had no impact.  Events occur 3-10 times a week typically lasting just seconds.   He has had some visual disturbances.  DATE TEST    4/16 Cath   LAD-distal 50% RCA 50% mid  9/18 Myoview   EF 55-65 %  exercise duration 5'31"  peak heart rate 142  9/18 Echo   EF 55-60 % DD 1;  EE < 10  12/18 LHC 55-65 LAD sTEN       ILR insertion 8/16 for presyncope and tachypalpitations; there has been no interval detections.  He called because of recurrent symptoms and discussed the use of patient activator       Cardiac risk factors include hypertension, vascular disease, and former smoking;  .             Past Medical History:  Diagnosis Date  . Arthritis   . Asthma    as child- no problem  . Atrial flutter (HCC)    a. s/p ablation  . CAD (coronary artery disease)    12/18 PCI/DES x1 to mLAD, normal EF  . Difficult intubation    maybe 8 years years ago, no trouble since then  . Fatty liver   . Fuchs' uveitis syndrome    left eye  . History of kidney stones   . Hypertension    . Primary localized osteoarthritis of right knee   . Sleep apnea    home sleep study shows some sleep apnea  . Stroke (HCC)   . TIA (transient ischemic attack) 2010    Past Surgical History:  Procedure Laterality Date  . acle repair Right 2000   ACL repair  . ATRIAL ABLATION SURGERY  10/2013   Dr Graciela Husbands  . ATRIAL FLUTTER ABLATION N/A 10/13/2013   Procedure: ATRIAL FLUTTER ABLATION;  Surgeon: Duke Salvia, MD;  Location: [facility] CATH LAB;  Service: Cardiovascular;  Laterality: N/A;  . BICEPS TENDON REPAIR    . CATARACT EXTRACTION W/ INTRAOCULAR LENS  IMPLANT, BILATERAL    . CORONARY STENT INTERVENTION N/A 01/13/2017   Procedure: CORONARY STENT INTERVENTION;  Surgeon: Corky Crafts, MD;  Location: [facility] INVASIVE CV LAB;  Service: Cardiovascular;  Laterality: N/A;  . CYSTOSCOPY W/ URETERAL STENT PLACEMENT  1999  . EP IMPLANTABLE DEVICE N/A 10/12/2014   Procedure: Loop Recorder Insertion;  Surgeon: Duke Salvia, MD;  Location: [facility] INVASIVE CV LAB;  Service: Cardiovascular;  Laterality: N/A;  . EYE SURGERY    . HERNIA REPAIR    . KIDNEY SURGERY Right   . KNEE CARTILAGE SURGERY  Bilateral   . LEFT HEART CATHETERIZATION WITH CORONARY ANGIOGRAM N/A 05/17/2014   Procedure: LEFT HEART CATHETERIZATION WITH CORONARY ANGIOGRAM;  Surgeon: Corky Crafts, MD;  Location: [facility] CATH LAB;  Service: Cardiovascular;  Laterality: N/A;  . RIGHT/LEFT HEART CATH AND CORONARY ANGIOGRAPHY N/A 01/13/2017   Procedure: RIGHT/LEFT HEART CATH AND CORONARY ANGIOGRAPHY;  Surgeon: Corky Crafts, MD;  Location: [facility] INVASIVE CV LAB;  Service: Cardiovascular;  Laterality: N/A;  . ROTATOR CUFF REPAIR Right 2005  . SHOULDER ARTHROSCOPY WITH ROTATOR CUFF REPAIR Right 11/07/2015   Procedure: SHOULDER ARTHROSCOPY WITH ROTATOR CUFF REPAIR AND DEBRIDEMENT with removal of loose bodies (suture);  Surgeon: Jones Broom, MD;  Location: [facility] OR;  Service: Orthopedics;  Laterality: Right;  . TONSILLECTOMY    . TOTAL HIP  ARTHROPLASTY Right 12/02/2014   Procedure: TOTAL HIP ARTHROPLASTY;  Surgeon: Frederico Hamman, MD;  Location: [facility] OR;  Service: Orthopedics;  Laterality: Right;  . TOTAL KNEE ARTHROPLASTY Right 11/26/2013   dr Madelon Lips  . TOTAL KNEE ARTHROPLASTY Right 11/26/2013   Procedure: RIGHT TOTAL KNEE ARTHROPLASTY;  Surgeon: Thera Flake., MD;  Location: [facility] OR;  Service: Orthopedics;  Laterality: Right;    Current Outpatient Medications  Medication Sig Dispense Refill  . apixaban (ELIQUIS) 5 MG TABS tablet Take 1 tablet (5 mg total) 2 (two) times daily by mouth. 180 tablet 1  . atorvastatin (LIPITOR) 20 MG tablet Take 1 tablet (20 mg total) by mouth daily. 90 tablet 3  . clopidogrel (PLAVIX) 75 MG tablet TAKE 1 TABLET BY MOUTH ONCE DAILY 90 tablet 3  . cyclobenzaprine (FLEXERIL) 10 MG tablet Take 10 mg by mouth 3 (three) times daily as needed for muscle spasms.    Marland Kitchen gabapentin (NEURONTIN) 300 MG capsule Take 300 mg by mouth daily as needed (back and muscle cramps).    Marland Kitchen lisinopril (PRINIVIL,ZESTRIL) 10 MG tablet Take 1 tablet (10 mg total) by mouth daily. 90 tablet 3  . metoprolol succinate (TOPROL XL) 25 MG 24 hr tablet Take 1 tablet (25 mg total) by mouth daily. 90 tablet 3  . nitroGLYCERIN (NITROSTAT) 0.4 MG SL tablet Place 1 tablet (0.4 mg total) under the tongue every 5 (five) minutes as needed. 25 tablet 2  . prednisoLONE acetate (PRED FORTE) 1 % ophthalmic suspension Place 1 drop into the left eye every morning.    . isosorbide mononitrate (IMDUR) 30 MG 24 hr tablet Take 1 tablet (30 mg total) by mouth daily. 90 tablet 3   No current facility-administered medications for this visit.     Allergies  Allergen Reactions  . Nucynta [Tapentadol] Itching  . Codeine Itching  . Hydrocodone Itching  . Oxycodone Itching    Review of Systems negative except from HPI and PMH  Physical Exam BP 122/70   Pulse 86   Ht  (1.93 m)   Wt 295 lb (133.8 kg)   SpO2 95%   BMI 35.91 kg/m  Well  developed and nourished in no acute distress HENT normal Neck supple with JVP-flat Clear Regular rate and rhythm, no murmurs or gallops Abd-soft with active BS No Clubbing cyanosis edema Skin-warm and dry A & Oriented  Grossly normal sensory and motor function  ECG sinus 80 16/08/39 nsst   Assessment and  Plan  Atrial flutter status post ablation   S/p Loop recorder  Hypertension  Sleep disordered breathing   Atrial tachycardia-nonsustained  TIA--MRI neg   Event recorder was personally reviewed.  There are no posttermination pauses.  He  is not sure that the metoprolol has made any difference.  He would like to stop it.  He also would like to stop his isosorbide as it precludes the use of ED meds  BP well controlled \ We spent more than 50% of our >25 min visit in face to face counseling regarding the above

## 2017-07-08 LAB — CUP PACEART INCLINIC DEVICE CHECK
MDC IDC PG IMPLANT DT: 20160831
MDC IDC SESS DTM: 20190522194838

## 2017-07-09 ENCOUNTER — Ambulatory Visit (INDEPENDENT_AMBULATORY_CARE_PROVIDER_SITE_OTHER): Payer: BLUE CROSS/BLUE SHIELD | Admitting: *Deleted

## 2017-07-09 DIAGNOSIS — R55 Syncope and collapse: Secondary | ICD-10-CM | POA: Diagnosis not present

## 2017-07-09 NOTE — Progress Notes (Signed)
Carelink Summary Report / Loop Recorder 

## 2017-07-11 ENCOUNTER — Encounter: Payer: Self-pay | Admitting: Internal Medicine

## 2017-07-31 ENCOUNTER — Encounter: Payer: Self-pay | Admitting: Interventional Cardiology

## 2017-08-04 LAB — CUP PACEART REMOTE DEVICE CHECK
Date Time Interrogation Session: 20190529141046
Implantable Pulse Generator Implant Date: 20160831

## 2017-08-05 ENCOUNTER — Telehealth: Payer: Self-pay

## 2017-08-05 NOTE — Telephone Encounter (Signed)
LMOVM reminding pt to send remote transmission.  Pt monitor has not updated in the last 14 days and need to send a transmission from home monitor.

## 2017-08-11 ENCOUNTER — Ambulatory Visit (INDEPENDENT_AMBULATORY_CARE_PROVIDER_SITE_OTHER): Payer: BLUE CROSS/BLUE SHIELD | Admitting: *Deleted

## 2017-08-11 DIAGNOSIS — R55 Syncope and collapse: Secondary | ICD-10-CM

## 2017-08-12 NOTE — Progress Notes (Signed)
Carelink Summary Report / Loop Recorder 

## 2017-08-19 ENCOUNTER — Telehealth: Payer: Self-pay | Admitting: Cardiology

## 2017-08-19 NOTE — Telephone Encounter (Signed)
Spoke w/ pt and requested that he send a manual transmission b/c his home monitor has not updated in at least 14 days.   

## 2017-08-23 ENCOUNTER — Encounter (HOSPITAL_COMMUNITY): Payer: Self-pay | Admitting: Emergency Medicine

## 2017-08-23 ENCOUNTER — Inpatient Hospital Stay (HOSPITAL_COMMUNITY)
Admission: EM | Admit: 2017-08-23 | Discharge: 2017-08-26 | DRG: 287 | Disposition: A | Discharge status: Home / Self Care | Payer: BLUE CROSS/BLUE SHIELD | Attending: Internal Medicine | Admitting: Internal Medicine

## 2017-08-23 ENCOUNTER — Emergency Department (HOSPITAL_COMMUNITY): Payer: BLUE CROSS/BLUE SHIELD

## 2017-08-23 DIAGNOSIS — R079 Chest pain, unspecified: Secondary | ICD-10-CM | POA: Diagnosis not present

## 2017-08-23 DIAGNOSIS — G4733 Obstructive sleep apnea (adult) (pediatric): Secondary | ICD-10-CM | POA: Diagnosis present

## 2017-08-23 DIAGNOSIS — I4892 Unspecified atrial flutter: Secondary | ICD-10-CM | POA: Diagnosis not present

## 2017-08-23 DIAGNOSIS — E785 Hyperlipidemia, unspecified: Secondary | ICD-10-CM | POA: Diagnosis present

## 2017-08-23 DIAGNOSIS — Z87891 Personal history of nicotine dependence: Secondary | ICD-10-CM

## 2017-08-23 DIAGNOSIS — R0789 Other chest pain: Secondary | ICD-10-CM | POA: Diagnosis not present

## 2017-08-23 DIAGNOSIS — Z87442 Personal history of urinary calculi: Secondary | ICD-10-CM

## 2017-08-23 DIAGNOSIS — I10 Essential (primary) hypertension: Secondary | ICD-10-CM | POA: Diagnosis not present

## 2017-08-23 DIAGNOSIS — R509 Fever, unspecified: Secondary | ICD-10-CM | POA: Diagnosis present

## 2017-08-23 DIAGNOSIS — W57XXXA Bitten or stung by nonvenomous insect and other nonvenomous arthropods, initial encounter: Secondary | ICD-10-CM | POA: Diagnosis present

## 2017-08-23 DIAGNOSIS — R21 Rash and other nonspecific skin eruption: Secondary | ICD-10-CM | POA: Diagnosis present

## 2017-08-23 DIAGNOSIS — Z885 Allergy status to narcotic agent status: Secondary | ICD-10-CM

## 2017-08-23 DIAGNOSIS — Z955 Presence of coronary angioplasty implant and graft: Secondary | ICD-10-CM

## 2017-08-23 DIAGNOSIS — I4891 Unspecified atrial fibrillation: Secondary | ICD-10-CM | POA: Diagnosis present

## 2017-08-23 DIAGNOSIS — M1990 Unspecified osteoarthritis, unspecified site: Secondary | ICD-10-CM | POA: Diagnosis present

## 2017-08-23 DIAGNOSIS — I25110 Atherosclerotic heart disease of native coronary artery with unstable angina pectoris: Principal | ICD-10-CM | POA: Diagnosis present

## 2017-08-23 DIAGNOSIS — Z7901 Long term (current) use of anticoagulants: Secondary | ICD-10-CM

## 2017-08-23 DIAGNOSIS — Z9841 Cataract extraction status, right eye: Secondary | ICD-10-CM

## 2017-08-23 DIAGNOSIS — Z96651 Presence of right artificial knee joint: Secondary | ICD-10-CM | POA: Diagnosis present

## 2017-08-23 DIAGNOSIS — A770 Spotted fever due to Rickettsia rickettsii: Secondary | ICD-10-CM | POA: Diagnosis present

## 2017-08-23 DIAGNOSIS — Z9842 Cataract extraction status, left eye: Secondary | ICD-10-CM

## 2017-08-23 DIAGNOSIS — K760 Fatty (change of) liver, not elsewhere classified: Secondary | ICD-10-CM | POA: Diagnosis present

## 2017-08-23 DIAGNOSIS — Z79899 Other long term (current) drug therapy: Secondary | ICD-10-CM

## 2017-08-23 DIAGNOSIS — Z888 Allergy status to other drugs, medicaments and biological substances status: Secondary | ICD-10-CM

## 2017-08-23 DIAGNOSIS — I082 Rheumatic disorders of both aortic and tricuspid valves: Secondary | ICD-10-CM | POA: Diagnosis present

## 2017-08-23 DIAGNOSIS — R51 Headache: Secondary | ICD-10-CM | POA: Diagnosis present

## 2017-08-23 DIAGNOSIS — Z8673 Personal history of transient ischemic attack (TIA), and cerebral infarction without residual deficits: Secondary | ICD-10-CM

## 2017-08-23 DIAGNOSIS — I878 Other specified disorders of veins: Secondary | ICD-10-CM | POA: Diagnosis present

## 2017-08-23 DIAGNOSIS — I8312 Varicose veins of left lower extremity with inflammation: Secondary | ICD-10-CM | POA: Diagnosis present

## 2017-08-23 DIAGNOSIS — Z8249 Family history of ischemic heart disease and other diseases of the circulatory system: Secondary | ICD-10-CM

## 2017-08-23 DIAGNOSIS — I8311 Varicose veins of right lower extremity with inflammation: Secondary | ICD-10-CM | POA: Diagnosis present

## 2017-08-23 DIAGNOSIS — Z96641 Presence of right artificial hip joint: Secondary | ICD-10-CM | POA: Diagnosis present

## 2017-08-23 LAB — CBC
HEMATOCRIT: 41.9 % (ref 39.0–52.0)
HEMOGLOBIN: 13.7 g/dL (ref 13.0–17.0)
MCH: 32.7 pg (ref 26.0–34.0)
MCHC: 32.7 g/dL (ref 30.0–36.0)
MCV: 100 fL (ref 78.0–100.0)
Platelets: 183 10*3/uL (ref 150–400)
RBC: 4.19 MIL/uL — AB (ref 4.22–5.81)
RDW: 12.8 % (ref 11.5–15.5)
WBC: 6.9 10*3/uL (ref 4.0–10.5)

## 2017-08-23 LAB — D-DIMER, QUANTITATIVE (NOT AT ARMC): D DIMER QUANT: 0.36 ug{FEU}/mL (ref 0.00–0.50)

## 2017-08-23 LAB — BASIC METABOLIC PANEL
Anion gap: 9 (ref 5–15)
BUN: 12 mg/dL (ref 8–23)
CHLORIDE: 102 mmol/L (ref 98–111)
CO2: 23 mmol/L (ref 22–32)
CREATININE: 1.01 mg/dL (ref 0.61–1.24)
Calcium: 8.8 mg/dL — ABNORMAL LOW (ref 8.9–10.3)
Glucose, Bld: 95 mg/dL (ref 70–99)
POTASSIUM: 4.3 mmol/L (ref 3.5–5.1)
SODIUM: 134 mmol/L — AB (ref 135–145)

## 2017-08-23 LAB — I-STAT TROPONIN, ED: Troponin i, poc: 0 ng/mL (ref 0.00–0.08)

## 2017-08-23 MED ORDER — APIXABAN 5 MG PO TABS
5.0000 mg | ORAL_TABLET | Freq: Two times a day (BID) | ORAL | Status: DC
  Administered 2017-08-24: 5 mg via ORAL
  Filled 2017-08-23 (×3): qty 1

## 2017-08-23 MED ORDER — ATORVASTATIN CALCIUM 20 MG PO TABS
20.0000 mg | ORAL_TABLET | Freq: Every day | ORAL | Status: DC
  Administered 2017-08-24 – 2017-08-26 (×3): 20 mg via ORAL
  Filled 2017-08-23 (×3): qty 1

## 2017-08-23 MED ORDER — NITROGLYCERIN 0.4 MG SL SUBL: 0.4000 mg | SUBLINGUAL_TABLET | SUBLINGUAL | Status: DC | PRN

## 2017-08-23 MED ORDER — ENOXAPARIN SODIUM 40 MG/0.4ML ~~LOC~~ SOLN: 40.0000 mg | SUBCUTANEOUS | Status: DC

## 2017-08-23 MED ORDER — ONDANSETRON HCL 4 MG/2ML IJ SOLN: 4.0000 mg | Freq: Four times a day (QID) | INTRAMUSCULAR | Status: DC | PRN

## 2017-08-23 MED ORDER — ACETAMINOPHEN 325 MG PO TABS
650.0000 mg | ORAL_TABLET | ORAL | Status: DC | PRN
  Administered 2017-08-24 – 2017-08-25 (×4): 650 mg via ORAL
  Filled 2017-08-23 (×4): qty 2

## 2017-08-23 MED ORDER — PREDNISOLONE ACETATE 1 % OP SUSP
1.0000 [drp] | Freq: Every day | OPHTHALMIC | Status: DC
  Administered 2017-08-24 – 2017-08-26 (×3): 1 [drp] via OPHTHALMIC
  Filled 2017-08-23: qty 5

## 2017-08-23 MED ORDER — LISINOPRIL 10 MG PO TABS
10.0000 mg | ORAL_TABLET | Freq: Every day | ORAL | Status: DC
  Administered 2017-08-24 – 2017-08-26 (×3): 10 mg via ORAL
  Filled 2017-08-23 (×3): qty 1

## 2017-08-23 MED ORDER — MORPHINE SULFATE (PF) 4 MG/ML IV SOLN
4.0000 mg | Freq: Once | INTRAVENOUS | Status: AC
  Administered 2017-08-23: 4 mg via INTRAVENOUS
  Filled 2017-08-23: qty 1

## 2017-08-23 MED ORDER — DIPHENHYDRAMINE HCL 50 MG/ML IJ SOLN
25.0000 mg | Freq: Once | INTRAMUSCULAR | Status: AC
  Administered 2017-08-23: 25 mg via INTRAVENOUS
  Filled 2017-08-23: qty 1

## 2017-08-23 NOTE — ED Provider Notes (Signed)
[facility] EMERGENCY DEPARTMENT Provider Note   CSN: [id number] Arrival date & time: 08/23/17  2053     History   Chief Complaint Chief Complaint  Patient presents with  . Chest Pain    HPI Curtis Barton is a 62 y.o. male.  The history is provided by the patient. No language interpreter was used.     63 year old male with significant history of CAD, hypertension, obstructive sleep apnea on CPAP, obesity, atrial flutter presenting for evaluation of chest pain.  Patient report sudden onset of left-sided chest pain that started earlier today.  Pain is described as a sharpness and pressure sensation radiates to his left shoulder with tingling sensation to his left fingers.  He report feeling flushed to his face, lightheadedness, fatigue.  Symptom has been persistent for the past several hours, pain is rated as 2 out of 10.  Nothing seems to make the symptoms better or worse.  He did report having some joint pain yesterday in which he takes some ibuprofen for it.  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  He also report history of CAD with cardiac stenting but states this pain felt different.  No report of prior PE.  He is currently on Eliquis.  No report of fever, headache, nausea, neck stiffness, abdominal pain or back pain.  Past Medical History:  Diagnosis Date  . Arthritis   . Asthma    as child- no problem  . Atrial flutter (HCC)    a. s/p ablation  . CAD (coronary artery disease)    12/18 PCI/DES x1 to mLAD, normal EF  . Difficult intubation    maybe 8 years years ago, no trouble since then  . Fatty liver   . Fuchs' uveitis syndrome    left eye  . History of kidney stones   . Hypertension   . Primary localized osteoarthritis of right knee   . Sleep apnea    home sleep study shows some sleep apnea  . Stroke (HCC)   . TIA (transient  ischemic attack) 2010    Patient Active Problem List   Diagnosis Date Noted  . DOE (dyspnea on exertion)   . S/P rotator cuff repair 11/07/2015  . OSA on CPAP 07/08/2015  . Obesity (BMI 30-39.9) 07/08/2015  . Coronary artery disease involving native coronary artery of native heart with unstable angina pectoris (HCC) 07/08/2015  . Primary localized osteoarthritis of right hip 12/02/2014  . Palpitations 10/12/2014  . Near syncope 10/12/2014  . Angina pectoris (HCC)   . Essential hypertension   . Osteoarthritis of right knee 11/26/2013  . History of tobacco abuse 11/15/2013  . Preoperative cardiovascular examination 11/15/2013  . Primary localized osteoarthritis of right knee 11/03/2013  . Hypertension   . Arthritis   . TIA (transient ischemic attack)   . Atrial flutter (HCC) 10/12/2013    Past Surgical History:  Procedure Laterality Date  . acle repair Right 2000   ACL repair  . ATRIAL ABLATION SURGERY  10/2013   Dr Graciela Husbands  . ATRIAL FLUTTER ABLATION N/A 10/13/2013   Procedure: ATRIAL FLUTTER ABLATION;  Surgeon: Duke Salvia, MD;  Location: [facility] CATH LAB;  Service: Cardiovascular;  Laterality: N/A;  . BICEPS TENDON REPAIR    . CATARACT EXTRACTION W/ INTRAOCULAR LENS  IMPLANT, BILATERAL    . CORONARY STENT INTERVENTION N/A  01/13/2017   Procedure: CORONARY STENT INTERVENTION;  Surgeon: Corky CraftsVaranasi, Jayadeep S, MD;  [facility];  Service: Cardiovascular;  Laterality: N/A;  . CYSTOSCOPY W/ URETERAL STENT PLACEMENT  1999  . EP IMPLANTABLE DEVICE N/A 10/12/2014   Procedure: Loop Recorder Insertion;  Surgeon: Duke SalviaSteven C Klein, MD;  [facility];  Service: Cardiovascular;  Laterality: N/A;  . EYE SURGERY    . HERNIA REPAIR    . KIDNEY SURGERY Right   . KNEE CARTILAGE SURGERY Bilateral   . LEFT HEART CATHETERIZATION WITH CORONARY ANGIOGRAM N/A 05/17/2014   Procedure: LEFT HEART CATHETERIZATION WITH CORONARY ANGIOGRAM;  Surgeon: Corky CraftsJayadeep S Varanasi, MD;  [facility];  Service: Cardiovascular;  Laterality: N/A;  . RIGHT/LEFT HEART CATH AND CORONARY ANGIOGRAPHY N/A 01/13/2017   Procedure: RIGHT/LEFT HEART CATH AND CORONARY ANGIOGRAPHY;  Surgeon: Corky CraftsVaranasi, Jayadeep S, MD;  [facility];  Service: Cardiovascular;  Laterality: N/A;  . ROTATOR CUFF REPAIR Right 2005  . SHOULDER ARTHROSCOPY WITH ROTATOR CUFF REPAIR Right 11/07/2015   Procedure: SHOULDER ARTHROSCOPY WITH ROTATOR CUFF REPAIR AND DEBRIDEMENT with removal of loose bodies (suture);  Surgeon: Jones BroomJustin Chandler, MD;  [facility];  Service: Orthopedics;  Laterality: Right;  . TONSILLECTOMY    . TOTAL HIP ARTHROPLASTY Right 12/02/2014   Procedure: TOTAL HIP ARTHROPLASTY;  Surgeon: Frederico Hammananiel Caffrey, MD;  [facility];  Service: Orthopedics;  Laterality: Right;  . TOTAL KNEE ARTHROPLASTY Right 11/26/2013   dr Madelon Lipscaffrey  . TOTAL KNEE ARTHROPLASTY Right 11/26/2013   Procedure: RIGHT TOTAL KNEE ARTHROPLASTY;  Surgeon: Thera FlakeW D Caffrey Jr., MD;  Location: [facility] OR;  Service: Orthopedics;  Laterality: Right;        Home Medications    Prior to Admission medications   Medication Sig Start Date End Date Taking? Authorizing Provider  apixaban (ELIQUIS) 5 MG TABS tablet Take 1 tablet (5 mg total) 2 (two) times daily by mouth. 12/30/16   Duke SalviaKlein, Steven C, MD  atorvastatin (LIPITOR) 20 MG tablet Take 1 tablet (20 mg total) by mouth daily. 04/21/17   Corky CraftsVaranasi, Jayadeep S, MD  clopidogrel (PLAVIX) 75 MG tablet TAKE 1 TABLET BY MOUTH ONCE DAILY 02/21/17   Duke SalviaKlein, Steven C, MD  cyclobenzaprine (FLEXERIL) 10 MG tablet Take 10 mg by mouth 3 (three) times daily as needed for muscle spasms.    [provider]  gabapentin (NEURONTIN) 300 MG capsule Take 300 mg by mouth daily as needed (back and muscle cramps).    [provider]  lisinopril (PRINIVIL,ZESTRIL) 10 MG tablet Take 1 tablet (10 mg total) by mouth daily. 04/24/17   Duke SalviaKlein, Steven C, MD  nitroGLYCERIN (NITROSTAT) 0.4 MG SL tablet  Place 1 tablet (0.4 mg total) under the tongue every 5 (five) minutes as needed. 01/13/17   Arty Baumgartneroberts, Lindsay B, NP  prednisoLONE acetate (PRED FORTE) 1 % ophthalmic suspension Place 1 drop into the left eye every morning.    [provider]    Family History Family History  Problem Relation Age of Onset  . Heart disease Mother   . Cancer Father   . Heart disease Unknown   . Hypertension Unknown   . Cancer Unknown     Social History Social History   Tobacco Use  . Smoking status: Former Smoker    Packs/day: 1.00    Types: Cigarettes    Last attempt to quit: 10/12/2013    Years since quitting: 3.8  . Smokeless tobacco: Never Used  Substance Use  Topics  . Alcohol use: Yes    Comment: ocassional  . Drug use: No     Allergies   Nucynta [tapentadol]; Codeine; Hydrocodone; and Oxycodone   Review of Systems Review of Systems  All other systems reviewed and are negative.    Physical Exam Updated Vital Signs BP (!) 139/93 (BP Location: Right Arm)   Pulse 91   Temp 98.4 F (36.9 C) (Oral)   Resp (!) 22   Ht 6\' 4"  (1.93 m)   Wt 131.5 kg (290 lb)   SpO2 91%   BMI 35.30 kg/m   Physical Exam  Constitutional: He appears well-developed and well-nourished. No distress.  HENT:  Head: Atraumatic.  Eyes: Conjunctivae are normal.  Neck: Normal range of motion. Neck supple.  Cardiovascular: Normal rate, regular rhythm, intact distal pulses and normal pulses.  Pulmonary/Chest: Effort normal and breath sounds normal. He has no decreased breath sounds. He exhibits tenderness (Tenderness to left anterolateral chest wall on palpation without overlying skin changes or crepitus.).  Abdominal: Soft.  Musculoskeletal: Normal range of motion.       Right lower leg: He exhibits no edema.       Left lower leg: He exhibits no edema.  Neurological: He is alert.  Skin: No rash noted.  Examining of the skin without concerning rash or embedded tick.  Psychiatric: He has a normal  mood and affect.  Nursing note and vitals reviewed.    ED Treatments / Results  Labs (all labs ordered are listed, but only abnormal results are displayed) Labs Reviewed  BASIC METABOLIC PANEL - Abnormal; Notable for the following components:      Result Value   Sodium 134 (*)    Calcium 8.8 (*)    All other components within normal limits  CBC - Abnormal; Notable for the following components:   RBC 4.19 (*)    All other components within normal limits  D-DIMER, QUANTITATIVE (NOT AT [facility])  LYME DISEASE DNA BY PCR(BORRELIA BURG)  ROCKY MTN SPOTTED FVR ABS PNL(IGG+IGM)  I-STAT TROPONIN, ED    EKG None  ED ECG REPORT   Date: 08/23/2017  Rate: 87  Rhythm: normal sinus rhythm  QRS Axis: normal  Intervals: normal  ST/T Wave abnormalities: nonspecific ST changes  Conduction Disutrbances:none  Narrative Interpretation:   Old EKG Reviewed: unchanged  I have personally reviewed the EKG tracing and agree with the computerized printout as noted.   Radiology Dg Chest 2 View  Result Date: 08/23/2017 CLINICAL DATA:  62 year old male with chest pain. EXAM: CHEST - 2 VIEW COMPARISON:  Chest radiograph dated 11/17/2014 FINDINGS: There is mild background of centrilobular emphysema. Bilateral upper lobe and biapical subpleural thickening. There is no focal consolidation, pleural effusion, or pneumothorax. The cardiac silhouette is within normal limits. A loop recorder device noted. No acute osseous pathology. IMPRESSION: No active cardiopulmonary disease. Electronically Signed   By: Elgie Collard M.D.   On: 08/23/2017 21:41    Procedures Procedures (including critical care time)  Medications Ordered in ED Medications  morphine 4 MG/ML injection 4 mg (4 mg Intravenous Given 08/23/17 2231)  diphenhydrAMINE (BENADRYL) injection 25 mg (25 mg Intravenous Given 08/23/17 2229)     Initial Impression / Assessment and Plan / ED Course  I have reviewed the triage vital signs and the  nursing notes.  Pertinent labs & imaging results that were available during my care of the patient were reviewed by me and considered in my medical decision making (see chart for  details).     BP (!) 139/93 (BP Location: Right Arm)   Pulse 91   Temp 98.4 F (36.9 C) (Oral)   Resp (!) 22   Ht 6\' 4"  (1.93 m)   Wt 131.5 kg (290 lb)   SpO2 91%   BMI 35.30 kg/m    Final Clinical Impressions(s) / ED Diagnoses   Final diagnoses:  Atypical chest pain  Tick bite, initial encounter    ED Discharge Orders    None     9:49 PM Patient with significant cardiac history here with left-sided chest pain concerning for potential ACS.  He did report sudden onset of shortness of breath and his current O2 sat is 91%.  Will obtain a d-dimer to assess for potential PE.  He mentioned having multiple tick bites recently.  Examination of the skin without any evidence of embedded tick or concerning rash.  Due to his concern, will obtain [facility] spotted fever titer.  11:16 PM Initial EKG without concerning changes, normal troponin.  Normal d-dimer, labs are reassuring, chest x-ray unremarkable.  Patient have a HEART score of 4 and therefore, will consult medicine for admission for chest pain rule out.  11:22 PM Appreciate consultation from [facility] DR. Julian Reil who agrees to see pt in the ER and admit for chest pain rule out. Pt voice understanding and agrees with plan.     Fayrene Helper, PA-C 08/23/17 2325    Loren Racer, MD 08/24/17 [phone]

## 2017-08-23 NOTE — ED Notes (Signed)
Nurse collected labs. 

## 2017-08-23 NOTE — ED Triage Notes (Signed)
Reports left sided chest pressure that started around 2pm today that has gotten progressively worse.  Pain radiating into left arm. Hx of recent blockage with stents in December 2018.

## 2017-08-23 NOTE — H&P (Signed)
History and Physical    Curtis Barton [id number] DOB: 1955/05/30 DOA: 08/23/2017  PCP: Kaleen MaskElkins, Wilson Toback, MD  Patient coming from: Home  I have personally briefly reviewed patient[facility]  Chief Complaint: Chest Pain  HPI: Curtis EagleShannon H Kasson is a 62 y.o. male with medical history significant of CAD, HTN, OSA, infrequent A.Flutter on eliquis.  Patient had PCI/DES to mLAD in 01/2017 due to decreased exercise tolerance.  This seemed to improve post procedure these past few months.  Patient presents to ED with c/o CP.  Patient report sudden onset of left-sided chest pain that started earlier today.  Pain is described as a sharpness and pressure sensation radiates to his left shoulder with tingling sensation to his left fingers.  He report feeling flushed to his face, lightheadedness, fatigue.  Symptom has been persistent for the past several hours, pain is rated as 2 out of 10.  Nothing seems to make the symptoms better or worse.  He did report having some joint pain yesterday in which he takes some ibuprofen for it.  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  No fever.  No h/o prior PE, is active and on eliquis anyhow.   ED Course: Trop neg.  EKG unimpressive (some questionable non-specific TW abnormalities, but Im not impressed).   Review of Systems: As per HPI otherwise 10 point review of systems negative.   Past Medical History:  Diagnosis Date  . Arthritis   . Asthma    as child- no problem  . Atrial flutter (HCC)    a. s/p ablation  . CAD (coronary artery disease)    12/18 PCI/DES x1 to mLAD, normal EF  . Difficult intubation    maybe 8 years years ago, no trouble since then  . Fatty liver   . Fuchs' uveitis syndrome    left eye  . History of kidney stones   . Hypertension   . Primary localized osteoarthritis of right  knee   . Sleep apnea    home sleep study shows some sleep apnea  . Stroke (HCC)   . TIA (transient ischemic attack) 2010    Past Surgical History:  Procedure Laterality Date  . acle repair Right 2000   ACL repair  . ATRIAL ABLATION SURGERY  10/2013   Dr Graciela HusbandsKlein  . ATRIAL FLUTTER ABLATION N/A 10/13/2013   Procedure: ATRIAL FLUTTER ABLATION;  Surgeon: Duke SalviaSteven C Klein, MD;  [facility];  Service: Cardiovascular;  Laterality: N/A;  . BICEPS TENDON REPAIR    . CATARACT EXTRACTION W/ INTRAOCULAR LENS  IMPLANT, BILATERAL    . CORONARY STENT INTERVENTION N/A 01/13/2017   Procedure: CORONARY STENT INTERVENTION;  Surgeon: Corky CraftsVaranasi, Jayadeep S, MD;  [facility];  Service: Cardiovascular;  Laterality: N/A;  . CYSTOSCOPY W/ URETERAL STENT PLACEMENT  1999  . EP IMPLANTABLE DEVICE N/A 10/12/2014   Procedure: Loop Recorder Insertion;  Surgeon: Duke SalviaSteven C Klein, MD;  [facility];  Service: Cardiovascular;  Laterality: N/A;  . EYE SURGERY    . HERNIA REPAIR    . KIDNEY SURGERY Right   . KNEE CARTILAGE SURGERY Bilateral   . LEFT HEART CATHETERIZATION WITH CORONARY ANGIOGRAM N/A 05/17/2014   Procedure: LEFT HEART CATHETERIZATION WITH CORONARY ANGIOGRAM;  Surgeon: Corky CraftsJayadeep S Varanasi, MD;  [facility];  Service: Cardiovascular;  Laterality: N/A;  . RIGHT/LEFT HEART CATH AND CORONARY ANGIOGRAPHY N/A 01/13/2017   Procedure: RIGHT/LEFT HEART CATH AND CORONARY ANGIOGRAPHY;  Surgeon: Corky Crafts, MD;  Location: [facility] INVAS[facility] CV LAB;  Service: Cardiovascular;  Laterality: N/A;  . ROTATOR CUFF REPAIR Right 2005  . SHOULDER ARTHROSCOPY WITH ROTATOR CUFF REPAIR Right 11/07/2015   Procedure: SHOULDER ARTHROSCOPY WITH ROTATOR CUFF REPAIR AND DEBRIDEMENT with removal of loose bodies (suture);  Surgeon: Jones Broom, MD;  Location: [facility] OR;  Service: Orthopedics;  Laterality: Right;  . TONSILLECTOMY    . TOTAL HIP ARTHROPLASTY Right 12/02/2014   Procedure: TOTAL HIP  ARTHROPLASTY;  Surgeon: Frederico Hamman, MD;  Location: [facility] OR;  Service: Orthopedics;  Laterality: Right;  . TOTAL KNEE ARTHROPLASTY Right 11/26/2013   dr Madelon Lips  . TOTAL KNEE ARTHROPLASTY Right 11/26/2013   Procedure: RIGHT TOTAL KNEE ARTHROPLASTY;  Surgeon: Thera Flake., MD;  Location: [facility] OR;  Service: Orthopedics;  Laterality: Right;     reports that he quit smoking about 3 years ago. His smoking use included cigarettes. He smoked 1.00 pack per day. He has never used smokeless tobacco. He reports that he drinks alcohol. He reports that he does not use drugs.  Allergies  Allergen Reactions  . Nucynta [Tapentadol] Itching  . Codeine Itching  . Hydrocodone Itching  . Oxycodone Itching    Family History  Problem Relation Age of Onset  . Heart disease Mother   . Cancer Father   . Heart disease Unknown   . Hypertension Unknown   . Cancer Unknown      Prior to Admission medications   Medication Sig Start Date End Date Taking? Authorizing Provider  apixaban (ELIQUIS) 5 MG TABS tablet Take 1 tablet (5 mg total) 2 (two) times daily by mouth. 12/30/16  Yes Duke Salvia, MD  atorvastatin (LIPITOR) 20 MG tablet Take 1 tablet (20 mg total) by mouth daily. 04/21/17  Yes Corky Crafts, MD  lisinopril (PRINIVIL,ZESTRIL) 10 MG tablet Take 1 tablet (10 mg total) by mouth daily. 04/24/17  Yes Duke Salvia, MD  nitroGLYCERIN (NITROSTAT) 0.4 MG SL tablet Place 1 tablet (0.4 mg total) under the tongue every 5 (five) minutes as needed. 01/13/17  Yes Laverda Page B, NP  prednisoLONE acetate (PRED FORTE) 1 % ophthalmic suspension Place 1 drop into the left eye daily.    Yes [provider]    Physical Exam: Vitals:   08/23/17 2215 08/23/17 2245 08/23/17 2315 08/23/17 2330  BP: (!) 137/91 (!) 145/87 (!) 141/93 (!) 136/91  Pulse: 91 80 77 77  Resp: (!) 21 11 14 17   Temp:      TempSrc:      SpO2: (!) 87% 91% 97% 94%  Weight:      Height:        Constitutional: NAD,  calm, comfortable Eyes: PERRL, lids and conjunctivae normal ENMT: Mucous membranes are moist. Posterior pharynx clear of any exudate or lesions.Normal dentition.  Neck: normal, supple, no masses, no thyromegaly Respiratory: clear to auscultation bilaterally, no wheezing, no crackles. Normal respiratory effort. No accessory muscle use.  Cardiovascular: Regular rate and rhythm, no murmurs / rubs / gallops. No extremity edema. 2+ pedal pulses. No carotid bruits.  Abdomen: no tenderness, no masses palpated. No hepatosplenomegaly. Bowel sounds positive.  Musculoskeletal: no clubbing / cyanosis. No joint deformity upper and lower extremities. Good ROM, no contractures. Normal muscle tone.  Skin: No rash concerning for erythema migrans, has venous stasis changes  of BLE and varicose veins Neurologic: CN 2-12 grossly intact. Sensation intact, DTR normal. Strength 5/5 in all 4.  Psychiatric: Normal judgment and insight. Alert and oriented x 3. Normal mood.    Labs on Admission: I have personally reviewed following labs and imaging studies  CBC: Recent Labs  Lab 08/23/17 2107  WBC 6.9  HGB 13.7  HCT 41.9  MCV 100.0  PLT 183   Basic Metabolic Panel: Recent Labs  Lab 08/23/17 2107  NA 134*  K 4.3  CL 102  CO2 23  GLUCOSE 95  BUN 12  CREATININE 1.01  CALCIUM 8.8*   GFR: Estimated Creatinine Clearance: 113.7 mL/min (by C-G formula based on SCr of 1.01 mg/dL). Liver Function Tests: No results for input(s): AST, ALT, ALKPHOS, BILITOT, PROT, ALBUMIN in the last 168 hours. No results for input(s): LIPASE, AMYLASE in the last 168 hours. No results for input(s): AMMONIA in the last 168 hours. Coagulation Profile: No results for input(s): INR, PROTIME in the last 168 hours. Cardiac Enzymes: No results for input(s): CKTOTAL, CKMB, CKMBINDEX, TROPONINI in the last 168 hours. BNP (last 3 results) No results for input(s): PROBNP in the last 8760 hours. HbA1C: No results for input(s): HGBA1C  in the last 72 hours. CBG: No results for input(s): GLUCAP in the last 168 hours. Lipid Profile: No results for input(s): CHOL, HDL, LDLCALC, TRIG, CHOLHDL, LDLDIRECT in the last 72 hours. Thyroid Function Tests: No results for input(s): TSH, T4TOTAL, FREET4, T3FREE, THYROIDAB in the last 72 hours. Anemia Panel: No results for input(s): VITAMINB12, FOLATE, FERRITIN, TIBC, IRON, RETICCTPCT in the last 72 hours. Urine analysis:    Component Value Date/Time   COLORURINE YELLOW 11/21/2014 1236   APPEARANCEUR CLEAR 11/21/2014 1236   LABSPEC 1.007 11/21/2014 1236   PHURINE 6.0 11/21/2014 1236   GLUCOSEU NEGATIVE 11/21/2014 1236   HGBUR NEGATIVE 11/21/2014 1236   BILIRUBINUR NEGATIVE 11/21/2014 1236   KETONESUR NEGATIVE 11/21/2014 1236   PROTEINUR NEGATIVE 11/21/2014 1236   UROBILINOGEN 0.2 11/21/2014 1236   NITRITE NEGATIVE 11/21/2014 1236   LEUKOCYTESUR NEGATIVE 11/21/2014 1236    Radiological Exams on Admission: Dg Chest 2 View  Result Date: 08/23/2017 CLINICAL DATA:  62 year old male with chest pain. EXAM: CHEST - 2 VIEW COMPARISON:  Chest radiograph dated 11/17/2014 FINDINGS: There is mild background of centrilobular emphysema. Bilateral upper lobe and biapical subpleural thickening. There is no focal consolidation, pleural effusion, or pneumothorax. The cardiac silhouette is within normal limits. A loop recorder device noted. No acute osseous pathology. IMPRESSION: No active cardiopulmonary disease. Electronically Signed   By: Elgie Collard M.D.   On: 08/23/2017 21:41    EKG: Independently reviewed.  Assessment/Plan Principal Problem:   Chest pain, rule out acute myocardial infarction Active Problems:   Atrial flutter [facility])   Essential hypertension   Coronary artery disease involving native coronary artery of native heart with unstable angina pectoris (HCC)   Tick bites    1. CP R/O - 1. CP obs pathway 2. Serial trops 3. Tele monitor 4. NPO after MN 5. Cards eval in  AM 2. H/o CAD - 1. Cont home meds 3. H/o Tick bites - 1. Possibly just a red-herring 2. PR interval of 160 is identical to priors, no evidence of AV conduction delay that is frequently seen with Lyme carditis. 3. No definite erythema migrans history 4. No definite fever history 5. Will send Lyme antibody titer, but dont feel strongly that empiric ABx therapy is indicated at this point. 4. A.Flutter  history - 1. Cont eliquis 5. HTN - continue home BP meds  DVT prophylaxis: Lovenox Code Status: Full Family Communication: No family in room Disposition Plan: Home after admit Consults called: Message put in to P. Trent for cards eval in AM Admission status: Place in [place], [place]. DO [facility] Pager [phone] Only works nights!  If 7AM-7PM, please contact the primary day team physician taking care of patient  [email or web address] Password [facility]  08/23/2017, 11:46 PM

## 2017-08-24 ENCOUNTER — Other Ambulatory Visit: Payer: Self-pay

## 2017-08-24 DIAGNOSIS — I878 Other specified disorders of veins: Secondary | ICD-10-CM | POA: Diagnosis present

## 2017-08-24 DIAGNOSIS — Z87891 Personal history of nicotine dependence: Secondary | ICD-10-CM | POA: Diagnosis not present

## 2017-08-24 DIAGNOSIS — I8311 Varicose veins of right lower extremity with inflammation: Secondary | ICD-10-CM | POA: Diagnosis present

## 2017-08-24 DIAGNOSIS — R0789 Other chest pain: Secondary | ICD-10-CM

## 2017-08-24 DIAGNOSIS — Z955 Presence of coronary angioplasty implant and graft: Secondary | ICD-10-CM | POA: Diagnosis not present

## 2017-08-24 DIAGNOSIS — M1990 Unspecified osteoarthritis, unspecified site: Secondary | ICD-10-CM | POA: Diagnosis present

## 2017-08-24 DIAGNOSIS — I10 Essential (primary) hypertension: Secondary | ICD-10-CM

## 2017-08-24 DIAGNOSIS — Z8673 Personal history of transient ischemic attack (TIA), and cerebral infarction without residual deficits: Secondary | ICD-10-CM | POA: Diagnosis not present

## 2017-08-24 DIAGNOSIS — R079 Chest pain, unspecified: Secondary | ICD-10-CM

## 2017-08-24 DIAGNOSIS — I25119 Atherosclerotic heart disease of native coronary artery with unspecified angina pectoris: Secondary | ICD-10-CM | POA: Diagnosis not present

## 2017-08-24 DIAGNOSIS — I082 Rheumatic disorders of both aortic and tricuspid valves: Secondary | ICD-10-CM | POA: Diagnosis present

## 2017-08-24 DIAGNOSIS — A770 Spotted fever due to Rickettsia rickettsii: Secondary | ICD-10-CM | POA: Diagnosis present

## 2017-08-24 DIAGNOSIS — I25110 Atherosclerotic heart disease of native coronary artery with unstable angina pectoris: Secondary | ICD-10-CM | POA: Diagnosis present

## 2017-08-24 DIAGNOSIS — Z96651 Presence of right artificial knee joint: Secondary | ICD-10-CM | POA: Diagnosis present

## 2017-08-24 DIAGNOSIS — Z96641 Presence of right artificial hip joint: Secondary | ICD-10-CM | POA: Diagnosis present

## 2017-08-24 DIAGNOSIS — Z8679 Personal history of other diseases of the circulatory system: Secondary | ICD-10-CM | POA: Diagnosis not present

## 2017-08-24 DIAGNOSIS — Z8249 Family history of ischemic heart disease and other diseases of the circulatory system: Secondary | ICD-10-CM | POA: Diagnosis not present

## 2017-08-24 DIAGNOSIS — W57XXXA Bitten or stung by nonvenomous insect and other nonvenomous arthropods, initial encounter: Secondary | ICD-10-CM | POA: Diagnosis present

## 2017-08-24 DIAGNOSIS — Z888 Allergy status to other drugs, medicaments and biological substances status: Secondary | ICD-10-CM | POA: Diagnosis not present

## 2017-08-24 DIAGNOSIS — I8312 Varicose veins of left lower extremity with inflammation: Secondary | ICD-10-CM | POA: Diagnosis present

## 2017-08-24 DIAGNOSIS — Z79899 Other long term (current) drug therapy: Secondary | ICD-10-CM | POA: Diagnosis not present

## 2017-08-24 DIAGNOSIS — K760 Fatty (change of) liver, not elsewhere classified: Secondary | ICD-10-CM | POA: Diagnosis present

## 2017-08-24 DIAGNOSIS — Z7901 Long term (current) use of anticoagulants: Secondary | ICD-10-CM | POA: Diagnosis not present

## 2017-08-24 DIAGNOSIS — Z87442 Personal history of urinary calculi: Secondary | ICD-10-CM | POA: Diagnosis not present

## 2017-08-24 DIAGNOSIS — I4892 Unspecified atrial flutter: Secondary | ICD-10-CM | POA: Diagnosis present

## 2017-08-24 DIAGNOSIS — Z885 Allergy status to narcotic agent status: Secondary | ICD-10-CM | POA: Diagnosis not present

## 2017-08-24 DIAGNOSIS — I4891 Unspecified atrial fibrillation: Secondary | ICD-10-CM | POA: Diagnosis present

## 2017-08-24 DIAGNOSIS — E785 Hyperlipidemia, unspecified: Secondary | ICD-10-CM | POA: Diagnosis present

## 2017-08-24 LAB — TROPONIN I
Troponin I: <0.03 ng/mL (ref <0.03)
Troponin I: <0.03 ng/mL (ref <0.03)
Troponin I: <0.03 ng/mL (ref <0.03)

## 2017-08-24 LAB — PROTIME-INR
INR: 1.14
Prothrombin Time: 14.6 seconds (ref 11.4–15.2)

## 2017-08-24 LAB — APTT: aPTT: 67 seconds — ABNORMAL HIGH (ref 24–36)

## 2017-08-24 LAB — HEPARIN LEVEL (UNFRACTIONATED): Heparin Unfractionated: 0.81 IU/mL — ABNORMAL HIGH (ref 0.30–0.70)

## 2017-08-24 LAB — HIV ANTIBODY (ROUTINE TESTING W REFLEX): HIV Screen 4th Generation wRfx: NONREACTIVE

## 2017-08-24 MED ORDER — SODIUM CHLORIDE 0.9 % WEIGHT BASED INFUSION
1.0000 mL/kg/h | INTRAVENOUS | Status: DC
  Administered 2017-08-25 (×2): 1 mL/kg/h via INTRAVENOUS

## 2017-08-24 MED ORDER — ASPIRIN EC 81 MG PO TBEC: 81.0000 mg | DELAYED_RELEASE_TABLET | Freq: Every day | ORAL | Status: DC

## 2017-08-24 MED ORDER — SODIUM CHLORIDE 0.9 % WEIGHT BASED INFUSION
3.0000 mL/kg/h | INTRAVENOUS | Status: DC
  Administered 2017-08-25: 3 mL/kg/h via INTRAVENOUS

## 2017-08-24 MED ORDER — HEPARIN (PORCINE) IN NACL 100-0.45 UNIT/ML-% IJ SOLN
1700.0000 [IU]/h | INTRAMUSCULAR | Status: DC
  Administered 2017-08-24: 1600 [IU]/h via INTRAVENOUS
  Filled 2017-08-24 (×2): qty 250

## 2017-08-24 MED ORDER — NITROGLYCERIN IN D5W 200-5 MCG/ML-% IV SOLN
0.0000 ug/min | INTRAVENOUS | Status: DC
Start: 2017-08-24 — End: 2017-08-26
  Administered 2017-08-24: 5 ug/min via INTRAVENOUS
  Filled 2017-08-24: qty 250

## 2017-08-24 MED ORDER — ASPIRIN 81 MG PO CHEW
81.0000 mg | CHEWABLE_TABLET | Freq: Every day | ORAL | Status: DC
  Administered 2017-08-24: 81 mg via ORAL
  Filled 2017-08-24: qty 1

## 2017-08-24 MED ORDER — METOPROLOL TARTRATE 25 MG PO TABS
25.0000 mg | ORAL_TABLET | Freq: Two times a day (BID) | ORAL | Status: DC
Start: 2017-08-24 — End: 2017-08-24

## 2017-08-24 MED ORDER — SENNOSIDES-DOCUSATE SODIUM 8.6-50 MG PO TABS
1.0000 | ORAL_TABLET | Freq: Two times a day (BID) | ORAL | Status: DC
  Administered 2017-08-24 – 2017-08-26 (×4): 1 via ORAL
  Filled 2017-08-24 (×5): qty 1

## 2017-08-24 MED ORDER — METOPROLOL TARTRATE 25 MG PO TABS
25.0000 mg | ORAL_TABLET | Freq: Two times a day (BID) | ORAL | Status: DC
  Administered 2017-08-24 – 2017-08-26 (×5): 25 mg via ORAL
  Filled 2017-08-24 (×5): qty 1

## 2017-08-24 MED ORDER — SODIUM CHLORIDE 0.9 % IV SOLN
INTRAVENOUS | Status: DC
  Administered 2017-08-24: 11:00:00 via INTRAVENOUS

## 2017-08-24 MED ORDER — KETOROLAC TROMETHAMINE 30 MG/ML IJ SOLN
30.0000 mg | Freq: Four times a day (QID) | INTRAMUSCULAR | Status: DC | PRN
  Administered 2017-08-24 (×2): 30 mg via INTRAVENOUS
  Filled 2017-08-24 (×3): qty 1

## 2017-08-24 MED ORDER — ASPIRIN 81 MG PO CHEW
81.0000 mg | CHEWABLE_TABLET | ORAL | Status: AC
  Administered 2017-08-25: 81 mg via ORAL
  Filled 2017-08-24: qty 1

## 2017-08-24 NOTE — Progress Notes (Signed)
PROGRESS NOTE    Curtis EagleShannon H Cozart  [id number] DOB: Sep 01, 1955 DOA: 08/23/2017 PCP: Kaleen MaskElkins, Wilson Baylock, MD  Brief Narrative: Curtis Barton is a 62 y.o. male with medical history significant of CAD, HTN, OSA, infrequent A.Flutter on eliquis.  Patient had PCI/DES to mLAD in 01/2017 due to decreased exercise tolerance.  Patient presented to ED 7/13 with c/o CP.  Patient report sudden onset of left-sided chest pain   Assessment & Plan:   1.CAD with unstable angina -History of PCI/DES to mid LAD in 01/2017 -troponins negative, nonspecific ST-T wave changes noted -cardiology consulting -Start aspirin, metoprolol, stopped Eliquis-> transition over to IV heparin -Plan for left heart catheterization tomorrow  2. Atrial flutter -In sinus rhythm at this time -Eliquis held, IV heparin -Continue beta blocker  3. Hypertension -metoprolol added, lisinopril continued  4. History of tick bites -Follow-up Lyme Ab titre -History not suggestive of acute LYMEs or RMSF  DVT prophylaxis: Heparin Code Status: Full Code Family Communication:no family at bedside Disposition Plan: home pending cardiac workup  Consultants:   cardiology   Procedures:   Antimicrobials:    Subjective: -denies any chest pain this morning  Objective: Vitals:   08/23/17 2345 08/24/17 0028 08/24/17 0359 08/24/17 0910  BP: (!) 159/94 (!) 158/85 131/87 (!) 134/91  Pulse: 87 89 70   Resp: 19 16 20    Temp:  98.3 F (36.8 C) 98.3 F (36.8 C)   TempSrc:  Oral Oral   SpO2: 96% 99% 98%   Weight:  131.9 kg (290 lb 12.8 oz)    Height:  6\' 4"  (1.93 m)      Intake/Output Summary (Last 24 hours) at 08/24/2017 1152 Last data filed at 08/24/2017 0400 Gross per 24 hour  Intake -  Output 175 ml  Net -175 ml   Filed Weights   08/23/17 2056 08/24/17 0028  Weight: 131.5 kg (290 lb) 131.9 kg (290 lb 12.8 oz)    Examination:  General exam: Appears calm and comfortable  Respiratory system: Clear to  auscultation. Respiratory effort normal. Cardiovascular system: S1 & S2 heard, RRR. No JVD, murmurs, rubs, gallops Gastrointestinal system: Abdomen is nondistended, soft and nontender.Normal bowel sounds heard. Central nervous system: Alert and oriented. No focal neurological deficits. Extremities: Symmetric 5 x 5 power. Skin: No rashes, lesions or ulcers Psychiatry: Judgement and insight appear normal. Mood & affect appropriate.     Data Reviewed:   CBC: Recent Labs  Lab 08/23/17 2107  WBC 6.9  HGB 13.7  HCT 41.9  MCV 100.0  PLT 183   Basic Metabolic Panel: Recent Labs  Lab 08/23/17 2107  NA 134*  K 4.3  CL 102  CO2 23  GLUCOSE 95  BUN 12  CREATININE 1.01  CALCIUM 8.8*   GFR: Estimated Creatinine Clearance: 113.9 mL/min (by C-G formula based on SCr of 1.01 mg/dL). Liver Function Tests: No results for input(s): AST, ALT, ALKPHOS, BILITOT, PROT, ALBUMIN in the last 168 hours. No results for input(s): LIPASE, AMYLASE in the last 168 hours. No results for input(s): AMMONIA in the last 168 hours. Coagulation Profile: No results for input(s): INR, PROTIME in the last 168 hours. Cardiac Enzymes: Recent Labs  Lab 08/24/17 0053 08/24/17 0331 08/24/17 0659  TROPONINI <0.03 <0.03 <0.03   BNP (last 3 results) No results for input(s): PROBNP in the last 8760 hours. HbA1C: No results for input(s): HGBA1C in the last 72 hours. CBG: No results for input(s): GLUCAP in the last 168 hours. Lipid Profile: No results  for input(s): CHOL, HDL, LDLCALC, TRIG, CHOLHDL, LDLDIRECT in the last 72 hours. Thyroid Function Tests: No results for input(s): TSH, T4TOTAL, FREET4, T3FREE, THYROIDAB in the last 72 hours. Anemia Panel: No results for input(s): VITAMINB12, FOLATE, FERRITIN, TIBC, IRON, RETICCTPCT in the last 72 hours. Urine analysis:    Component Value Date/Time   COLORURINE YELLOW 11/21/2014 1236   APPEARANCEUR CLEAR 11/21/2014 1236   LABSPEC 1.007 11/21/2014 1236    PHURINE 6.0 11/21/2014 1236   GLUCOSEU NEGATIVE 11/21/2014 1236   HGBUR NEGATIVE 11/21/2014 1236   BILIRUBINUR NEGATIVE 11/21/2014 1236   KETONESUR NEGATIVE 11/21/2014 1236   PROTEINUR NEGATIVE 11/21/2014 1236   UROBILINOGEN 0.2 11/21/2014 1236   NITRITE NEGATIVE 11/21/2014 1236   LEUKOCYTESUR NEGATIVE 11/21/2014 1236   Sepsis Labs: @LABRCNTIP (procalcitonin:4,lacticidven:4)  )No results found for this or any previous visit (from the past 240 hour(s)).       Radiology Studies: Dg Chest 2 View  Result Date: 08/23/2017 CLINICAL DATA:  62 year old male with chest pain. EXAM: CHEST - 2 VIEW COMPARISON:  Chest radiograph dated 11/17/2014 FINDINGS: There is mild background of centrilobular emphysema. Bilateral upper lobe and biapical subpleural thickening. There is no focal consolidation, pleural effusion, or pneumothorax. The cardiac silhouette is within normal limits. A loop recorder device noted. No acute osseous pathology. IMPRESSION: No active cardiopulmonary disease. Electronically Signed   By: Elgie Collard M.D.   On: 08/23/2017 21:41        Scheduled Meds: . atorvastatin  20 mg Oral Daily  . lisinopril  10 mg Oral Daily  . prednisoLONE acetate  1 drop Left Eye Daily   Continuous Infusions: . sodium chloride 10 mL/hr at 08/24/17 1111  . nitroGLYCERIN 5 mcg/min (08/24/17 1113)     LOS: 0 days    Time spent:    Zannie Cove, MD [facility] Page via [email or web address], password [email or web address] After 7PM please contact night-coverage  08/24/2017, 11:52 AM

## 2017-08-24 NOTE — ED Notes (Signed)
Report to Mindy RN

## 2017-08-24 NOTE — Progress Notes (Signed)
ANTICOAGULATION CONSULT NOTE - Initial Consult  Pharmacy Consult for Heparin Indication: chest pain/ACS  Allergies  Allergen Reactions   Nucynta [Tapentadol] Itching   Codeine Itching   Hydrocodone Itching   Oxycodone Itching    Patient Measurements: Height: 6\' 4"  (193 cm) Weight: 290 lb 12.8 oz (131.9 kg) IBW/kg (Calculated) : 86.8 Heparin Dosing Weight: 115.5kg  Vital Signs: Temp: 98.3 F (36.8 C) (07/14 0359) Temp Source: Oral (07/14 0359) BP: 134/91 (07/14 0910) Pulse Rate: 70 (07/14 0359)  Labs: Recent Labs    08/23/17 2107  08/24/17 0331 08/24/17 0659 08/24/17 1110  HGB 13.7  --   --   --   --   HCT 41.9  --   --   --   --   PLT 183  --   --   --   --   CREATININE 1.01  --   --   --   --   TROPONINI  --    < > <0.03 <0.03 <0.03   < > = values in this interval not displayed.    Estimated Creatinine Clearance: 113.9 mL/min (by C-G formula based on SCr of 1.01 mg/dL).   Medical History: Past Medical History:  Diagnosis Date   Arthritis    Asthma    as child- no problem   Atrial flutter (HCC)    a. s/p ablation   CAD (coronary artery disease)    12/18 PCI/DES x1 to mLAD, normal EF   Difficult intubation    maybe 8 years years ago, no trouble since then   Fatty liver    Fuchs' uveitis syndrome    left eye   History of kidney stones    Hypertension    Primary localized osteoarthritis of right knee    Sleep apnea    home sleep study shows some sleep apnea   Stroke [facility](HCC)    TIA (transient ischemic attack) 2010    Assessment: 62 YO M with hx of infrequent Aflutter on Eliquis PTA and CAD [facility]. Last dose of Eliquis 7/14 0055. Pharmacy consulted to switch to heparin prior to cath scheduled 7/15. Will monitor aPTT and heparin levels until correlate.   CBC stable, Scr 1.01, no reports of bleeding.   Goal of Therapy:  Heparin level 0.3-0.7 units/ml aPTT 66-102 seconds Monitor platelets by anticoagulation protocol:  Yes   Plan:  -Give Heparin 1600 unit/hr infusion -6 hour aPTT  -Daily aPTT, heparin level, CBC -Monitor s/sx bleeding  -F/u plans post cath  Lenward Chancelloranya Makhlouf, PharmD PGY1 Pharmacy Resident Phone [phone] 08/24/2017 12:43 PM

## 2017-08-24 NOTE — Consult Note (Addendum)
Cardiology Consultation:   Patient ID: Curtis Barton; [id number]; [birth year]   Admit date: 08/23/2017 Date of Consult: 08/24/2017  Primary Care Provider: Kaleen Mask, MD Primary Cardiologist: Lance Muss, MD  Primary Electrophysiologist:  Sherryl Manges, MD   Patient Profile:   Curtis Barton is a 62 y.o. male with a hx of coronary artery disease status post drug-eluting stent to the LAD in December 2018, atrial flutter status post ablation, prior TIA, hypertension, palpitations and near syncope status post ILR in August 2016 who is being seen today for the evaluation of chest pain at the request of Dr. Julian Reil.  History of Present Illness:   Curtis Barton was last seen by Dr. Eldridge Dace in March 2019.  The patient had a Synergy stent which would help limit antiplatelet therapy.  The patient was due to have a spinal injection and his Clopidogrel was discontinued.  Over the last several weeks, he has noted some mild shortness of breath with activity.  Yesterday, he became extremely weak and intolerant of activity.  He is a Visual merchandiser.  He had gone to the store to pay a bill and had to sit in his truck after walking to the store for several minutes to recover.  He notes a lower left-sided chest discomfort described as pressure.  He also notes radiation up into his jaw and down his left arm and hand.  He has been short of breath with this.  The quality of his pain was different with his angina in December.  However, all of his other symptoms are very similar.  His pain has waxed and waned since he has been here.  He still having 2/10 discomfort now, but is comfortable.  Troponin levels have remained negative.  ECG demonstrates normal sinus rhythm rate 87, normal axis, QTC 418, no acute ST-T wave changes  R/L Cardiac catheterization 01/13/2017 LAD mid 75, distal 50 LCx mid 50 RCA proximal 25 EF 55-65 Right heart pressures:  Ao sat 98%; PA sat 69%; CO 5.8 L/min; CI 2.28. Normal PA  pressures. PCWP mean 13 mm Hg. PCI: 3 x 24 mm Synergy DES to the mid LAD  Nuclear stress test 11/08/2016 EF 62, Low risk stress nuclear study with normal perfusion and normal left ventricular regional and global systolic function.  Echocardiogram 11/05/2016 - Left ventricle: Wall thickness was increased in a pattern of mild   LVH. Systolic function was normal. The estimated ejection   fraction was in the range of 60% to 65%. Doppler parameters are   consistent with abnormal left ventricular relaxation (grade 1   diastolic dysfunction). - Aortic valve: There was trivial regurgitation. - Right ventricle: The cavity size was mildly dilated. - Tricuspid valve: There was moderate regurgitation.  Impressions: - Compared to the prior study, there has been no significant   interval change.    Past Medical History:  Diagnosis Date  . Arthritis   . Asthma    as child- no problem  . Atrial flutter (HCC)    a. s/p ablation  . CAD (coronary artery disease)    12/18 PCI/DES x1 to mLAD, normal EF  . Difficult intubation    maybe 8 years years ago, no trouble since then  . Fatty liver   . Fuchs' uveitis syndrome    left eye  . History of kidney stones   . Hypertension   . Primary localized osteoarthritis of right knee   . Sleep apnea    home sleep study shows some sleep apnea  .  Stroke (HCC)   . TIA (transient ischemic attack) 2010    Past Surgical History:  Procedure Laterality Date  . acle repair Right 2000   ACL repair  . ATRIAL ABLATION SURGERY  10/2013   Dr Graciela Husbands  . ATRIAL FLUTTER ABLATION N/A 10/13/2013   Procedure: ATRIAL FLUTTER ABLATION;  Surgeon: Duke Salvia, MD;  Location: [facility] CATH LAB;  Service: Cardiovascular;  Laterality: N/A;  . BICEPS TENDON REPAIR    . CATARACT EXTRACTION W/ INTRAOCULAR LENS  IMPLANT, BILATERAL    . CORONARY STENT INTERVENTION N/A 01/13/2017   Procedure: CORONARY STENT INTERVENTION;  Surgeon: Corky Crafts, MD;  Location: [facility] INVASIVE CV  LAB;  Service: Cardiovascular;  Laterality: N/A;  . CYSTOSCOPY W/ URETERAL STENT PLACEMENT  1999  . EP IMPLANTABLE DEVICE N/A 10/12/2014   Procedure: Loop Recorder Insertion;  Surgeon: Duke Salvia, MD;  Location: [facility] INVASIVE CV LAB;  Service: Cardiovascular;  Laterality: N/A;  . EYE SURGERY    . HERNIA REPAIR    . KIDNEY SURGERY Right   . KNEE CARTILAGE SURGERY Bilateral   . LEFT HEART CATHETERIZATION WITH CORONARY ANGIOGRAM N/A 05/17/2014   Procedure: LEFT HEART CATHETERIZATION WITH CORONARY ANGIOGRAM;  Surgeon: Corky Crafts, MD;  Location: [facility] CATH LAB;  Service: Cardiovascular;  Laterality: N/A;  . RIGHT/LEFT HEART CATH AND CORONARY ANGIOGRAPHY N/A 01/13/2017   Procedure: RIGHT/LEFT HEART CATH AND CORONARY ANGIOGRAPHY;  Surgeon: Corky Crafts, MD;  Location: [facility] INVASIVE CV LAB;  Service: Cardiovascular;  Laterality: N/A;  . ROTATOR CUFF REPAIR Right 2005  . SHOULDER ARTHROSCOPY WITH ROTATOR CUFF REPAIR Right 11/07/2015   Procedure: SHOULDER ARTHROSCOPY WITH ROTATOR CUFF REPAIR AND DEBRIDEMENT with removal of loose bodies (suture);  Surgeon: Jones Broom, MD;  Location: [facility] OR;  Service: Orthopedics;  Laterality: Right;  . TONSILLECTOMY    . TOTAL HIP ARTHROPLASTY Right 12/02/2014   Procedure: TOTAL HIP ARTHROPLASTY;  Surgeon: Frederico Hamman, MD;  Location: [facility] OR;  Service: Orthopedics;  Laterality: Right;  . TOTAL KNEE ARTHROPLASTY Right 11/26/2013   dr Madelon Lips  . TOTAL KNEE ARTHROPLASTY Right 11/26/2013   Procedure: RIGHT TOTAL KNEE ARTHROPLASTY;  Surgeon: Thera Flake., MD;  Location: [facility] OR;  Service: Orthopedics;  Laterality: Right;     Home Medications:  Prior to Admission medications   Medication Sig Start Date End Date Taking? Authorizing Provider  apixaban (ELIQUIS) 5 MG TABS tablet Take 1 tablet (5 mg total) 2 (two) times daily by mouth. 12/30/16  Yes Duke Salvia, MD  atorvastatin (LIPITOR) 20 MG tablet Take 1 tablet (20 mg total) by mouth daily. 04/21/17  Yes  Corky Crafts, MD  lisinopril (PRINIVIL,ZESTRIL) 10 MG tablet Take 1 tablet (10 mg total) by mouth daily. 04/24/17  Yes Duke Salvia, MD  nitroGLYCERIN (NITROSTAT) 0.4 MG SL tablet Place 1 tablet (0.4 mg total) under the tongue every 5 (five) minutes as needed. 01/13/17  Yes Laverda Page B, NP  prednisoLONE acetate (PRED FORTE) 1 % ophthalmic suspension Place 1 drop into the left eye daily.    Yes [provider]    Inpatient Medications: Scheduled Meds: . apixaban  5 mg Oral BID  . atorvastatin  20 mg Oral Daily  . lisinopril  10 mg Oral Daily  . prednisoLONE acetate  1 drop Left Eye Daily   Continuous Infusions:  PRN Meds: acetaminophen, ketorolac, nitroGLYCERIN, ondansetron (ZOFRAN) IV  Allergies:    Allergies  Allergen Reactions  . Nucynta [Tapentadol] Itching  . Codeine  Itching  . Hydrocodone Itching  . Oxycodone Itching    Social History:   Social History   Socioeconomic History  . Marital status: Married    Spouse name: Not on file  . Number of children: Not on file  . Years of education: Not on file  . Highest education level: Not on file  Occupational History  . Not on file  Social Needs  . Financial resource strain: Not on file  . Food insecurity:    Worry: Not on file    Inability: Not on file  . Transportation needs:    Medical: Not on file    Non-medical: Not on file  Tobacco Use  . Smoking status: Former Smoker    Packs/day: 1.00    Types: Cigarettes    Last attempt to quit: 10/12/2013    Years since quitting: 3.8  . Smokeless tobacco: Never Used  Substance and Sexual Activity  . Alcohol use: Yes    Comment: ocassional  . Drug use: No  . Sexual activity: Not on file  Lifestyle  . Physical activity:    Days per week: Not on file    Minutes per session: Not on file  . Stress: Not on file  Relationships  . Social connections:    Talks on phone: Not on file    Gets together: Not on file    Attends religious service: Not  on file    Active member of club or organization: Not on file    Attends meetings of clubs or organizations: Not on file    Relationship status: Not on file  . Intimate partner violence:    Fear of current or ex partner: Not on file    Emotionally abused: Not on file    Physically abused: Not on file    Forced sexual activity: Not on file  Other Topics Concern  . Not on file  Social History Narrative  . Not on file    Family History:    Family History  Problem Relation Age of Onset  . Heart disease Mother   . Cancer Father   . Heart disease Unknown   . Hypertension Unknown   . Cancer Unknown      ROS:  Please see the history of present illness.  He denies fever, cough, melena, hematochezia, vomiting, diarrhea. All other ROS reviewed and negative.     Physical Exam/Data:   Vitals:   08/23/17 2330 08/23/17 2345 08/24/17 0028 08/24/17 0359  BP: (!) 136/91 (!) 159/94 (!) 158/85 131/87  Pulse: 77 87 89 70  Resp: 17 19 16 20   Temp:   98.3 F (36.8 C) 98.3 F (36.8 C)  TempSrc:   Oral Oral  SpO2: 94% 96% 99% 98%  Weight:   290 lb 12.8 oz (131.9 kg)   Height:   6\' 4"  (1.93 m)     Intake/Output Summary (Last 24 hours) at 08/24/2017 0729 Last data filed at 08/24/2017 0400 Gross per 24 hour  Intake -  Output 175 ml  Net -175 ml   Filed Weights   08/23/17 2056 08/24/17 0028  Weight: 290 lb (131.5 kg) 290 lb 12.8 oz (131.9 kg)   Body mass index is 35.4 kg/m.  General:  Well nourished, well developed, in no acute distress  HEENT: normal Lymph: no adenopathy Neck: no JVD Endocrine:  No thryomegaly Vascular: DP/PT 2+ bilaterally; no carotid bruits bilaterally Cardiac:  normal S1, S2; RRR; no murmur  Lungs:  clear to auscultation bilaterally,  no wheezing, rhonchi or rales  Abd: soft, nontender  Ext: no edema Musculoskeletal:  No deformities Skin: warm and dry  Neuro:  CNs 2-12 intact, no focal abnormalities noted Psych:  Normal affect   EKG:  The EKG was  personally reviewed and demonstrates:  normal sinus rhythm rate 87, normal axis, QTC 418, no acute ST-T wave changes  Telemetry:  Telemetry was personally reviewed and demonstrates: Normal sinus rhythm  Relevant CV Studies: None since admission  Laboratory Data:  Chemistry Recent Labs  Lab 08/23/17 2107  NA 134*  K 4.3  CL 102  CO2 23  GLUCOSE 95  BUN 12  CREATININE 1.01  CALCIUM 8.8*  GFRNONAA >60  GFRAA >60  ANIONGAP 9    No results for input(s): PROT, ALBUMIN, AST, ALT, ALKPHOS, BILITOT in the last 168 hours. Hematology Recent Labs  Lab 08/23/17 2107  WBC 6.9  RBC 4.19*  HGB 13.7  HCT 41.9  MCV 100.0  MCH 32.7  MCHC 32.7  RDW 12.8  PLT 183   Cardiac Enzymes Recent Labs  Lab 08/24/17 0053 08/24/17 0331  TROPONINI <0.03 <0.03    Recent Labs  Lab 08/23/17 2116  TROPIPOC 0.00    BNPNo results for input(s): BNP, PROBNP in the last 168 hours.  DDimer  Recent Labs  Lab 08/23/17 2146  DDIMER 0.36    Radiology/Studies:  Dg Chest 2 View  Result Date: 08/23/2017 CLINICAL DATA:  62 year old male with chest pain. EXAM: CHEST - 2 VIEW COMPARISON:  Chest radiograph dated 11/17/2014 FINDINGS: There is mild background of centrilobular emphysema. Bilateral upper lobe and biapical subpleural thickening. There is no focal consolidation, pleural effusion, or pneumothorax. The cardiac silhouette is within normal limits. A loop recorder device noted. No acute osseous pathology. IMPRESSION: No active cardiopulmonary disease. Electronically Signed   By: Elgie Collard M.D.   On: 08/23/2017 21:41    Assessment and Plan:   1.  Coronary Artery Disease with unstable angina  - He describes sudden onset of symptoms yesterday with exercise intolerance, left-sided chest discomfort with associated arm and jaw discomfort and shortness of breath.  This is concerning for unstable angina pectoris.  ECG does not demonstrate acute ST changes.  Cardiac enzymes thus far are negative.   Any event, he will likely need cardiac catheterization.  Attending MD.   - Start Metoprolol Tartrate 25 mg Twice daily   - Start ASA 81 mg Once daily   - No PDE-5 inhibitor use >> can consider adding nitrates if chest pain not controlled  - Pharmacy to transition Eliquis to IV Heparin  - Continue to cycle enzymes   - Plan Cardiac Catheterization tomorrow afternoon (last dose of Eliquis was 12 am)   2.  Atrial flutter -  No apparent recurrence.  Maintaining NSR currently.  He remains on anticoagulation with Eliquis.  Will transition this to IV Heparin since he is still having chest pain.  Last dose of Eliquis was 12 am today.   4.  Hypertension - BP above target yesterday.  Continue Lisinopril.  Add Metoprolol Tartrate 25 mg Twice daily.  5.  Hyperlipidemia - Continue statin therapy.     For questions or updates, please contact [facility] Please consult [email or web address] for contact info under Cardiology/STEMI.   Signed, Tereso Newcomer, PA-C  08/24/2017 7:29 AM    Attending note:  Patient seen and examined.  I reviewed his records and discussed the case with Curtis Barton.  Curtis Barton presents to the hospital describing  approximately 6-week history of increasing dyspnea on exertion culminating and worsening symptoms over the last 24 hours but also include chest pressure, radiation up to the jaw and left arm.  He states that the symptoms are somewhat different from his previous angina in December 2018 with the exception of the shortness of breath.  He reports compliance with his medications.  On examination this morning he reports no active symptoms.  Systolic blood pressure is in the 1 30-1 50 range, heart rate in the 70s to 80s in sinus rhythm by telemetry which I personally reviewed.  Lungs are clear without labored breathing.  Cardiac exam reveals RRR without gallop.  Lab work shows negative troponin I levels less than 0.03, potassium 4.3, BUN 12, creatinine 1.01, hemoglobin 13.7,  platelets 183, d-dimer 0.36.  Chest x-ray reports no acute process.  I personally reviewed his ECG which shows sinus rhythm and nonspecific ST changes.  Patient presents with symptoms concerning for unstable angina although with negative troponin I levels at this time and nonspecific ST changes by ECG.  He has a history of DES intervention to the LAD in December 2018.  Plan is to arrange diagnostic cardiac catheterization for tomorrow afternoon.  He has a history of atrial flutter status post ablation and is on Eliquis as an outpatient.  His last dose was yesterday evening so he should be able to proceed with cardiac catheterization tomorrow afternoon, optimally via radial approach.  Risks and benefits discussed and he is in agreement.  Jonelle Sidle, M.D., F.A.C.C.

## 2017-08-24 NOTE — Progress Notes (Signed)
Pt states he can place self on home CPAP unit when ready for bed. RT will continue to monitor as needed.

## 2017-08-24 NOTE — Progress Notes (Signed)
   Patient called RN with chest pain. He had an episode of chest pain a few minutes ago that reminded him of his prior angina. He is now pain free.  PLAN: 1. Start IV NTG 2. Pharmacy consult pending for transition of Eliquis to IV Heparin 3. Continue to cycle enzymes  Tereso NewcomerScott Breezy Hertenstein, PA-C    08/24/2017 10:54 AM

## 2017-08-24 NOTE — Progress Notes (Signed)
ANTICOAGULATION CONSULT NOTE - Initial Consult  Pharmacy Consult for Heparin Indication: chest pain/ACS  Allergies  Allergen Reactions  . Nucynta [Tapentadol] Itching  . Codeine Itching  . Hydrocodone Itching  . Oxycodone Itching    Patient Measurements: Height: 6\' 4"  (193 cm) Weight: 290 lb 12.8 oz (131.9 kg) IBW/kg (Calculated) : 86.8 Heparin Dosing Weight: 115.5kg  Vital Signs: Temp: 98.1 F (36.7 C) (07/14 2017) Temp Source: Oral (07/14 2017) BP: 123/77 (07/14 2050) Pulse Rate: 66 (07/14 2017)  Labs: Recent Labs    08/23/17 2107  08/24/17 1110 08/24/17 1654 08/24/17 2004 08/24/17 2112  HGB 13.7  --   --   --   --   --   HCT 41.9  --   --   --   --   --   PLT 183  --   --   --   --   --   APTT  --   --   --   --   --  67*  LABPROT  --   --   --   --   --  14.6  INR  --   --   --   --   --  1.14  HEPARINUNFRC  --   --   --   --   --  0.81*  CREATININE 1.01  --   --   --   --   --   TROPONINI  --    < > <0.03 <0.03 <0.03  --    < > = values in this interval not displayed.    Estimated Creatinine Clearance: 113.9 mL/min (by C-G formula based on SCr of 1.01 mg/dL).   Medical History: Past Medical History:  Diagnosis Date  . Arthritis   . Asthma    as child- no problem  . Atrial flutter (HCC)    a. s/p ablation  . CAD (coronary artery disease)    61/18 PCI/DES x1 to mLAD, normal EF  . Difficult intubation    maybe 8 years years ago, no trouble since then  . Fatty liver   . Fuchs' uveitis syndrome    left eye  . History of kidney stones   . Hypertension   . Primary localized osteoarthritis of right knee   . Sleep apnea    home sleep study shows some sleep apnea  . Stroke (HCC)   . TIA (transient ischemic attack) 62    Assessment: 62 YO M with hx of infrequent Aflutter on Eliquis PTA and CAD [facility]. Last dose of Eliquis 7/14 0055. Pharmacy consulted to switch to heparin prior to cath scheduled 7/15. Will monitor aPTT and heparin  levels until correlate.   Initial heparin level slightly elevated at 0.81 (as expected given apixaban dose this morning). Initial aPTT therapeutic on low end at 67, on 1600 units/hr. No s/sx of bleeding. No infusion issues.   Goal of Therapy:  Heparin level 0.3-0.7 units/ml aPTT 66-102 seconds Monitor platelets by anticoagulation protocol: Yes   Plan:  -Increase heparin infusion to 1700 unit/hr  -6 hour confirmatory aPTT  -Daily aPTT, heparin level, CBC -Monitor s/sx bleeding  -F/u plans post cath  Girard CooterKimberly Perkins, PharmD Clinical Pharmacist  Pager: [phone] Phone: [phone] 08/25/59 10:05 PM

## 2017-08-25 ENCOUNTER — Encounter (HOSPITAL_COMMUNITY)
Admission: EM | Disposition: A | Discharge status: Home / Self Care | Payer: Self-pay | Source: Home / Self Care | Attending: Internal Medicine

## 2017-08-25 DIAGNOSIS — I25110 Atherosclerotic heart disease of native coronary artery with unstable angina pectoris: Principal | ICD-10-CM

## 2017-08-25 HISTORY — PX: LEFT HEART CATH AND CORONARY ANGIOGRAPHY: CATH118249

## 2017-08-25 LAB — BASIC METABOLIC PANEL
Anion gap: 4 — ABNORMAL LOW (ref 5–15)
BUN: 8 mg/dL (ref 8–23)
CO2: 22 mmol/L (ref 22–32)
CREATININE: 0.76 mg/dL (ref 0.61–1.24)
Calcium: 6.4 mg/dL — CL (ref 8.9–10.3)
Chloride: 115 mmol/L — ABNORMAL HIGH (ref 98–111)
GFR calc Af Amer: >60 mL/min (ref >60)
GFR calc non Af Amer: >60 mL/min (ref >60)
GLUCOSE: 84 mg/dL (ref 70–99)
Potassium: 3.4 mmol/L — ABNORMAL LOW (ref 3.5–5.1)
Sodium: 141 mmol/L (ref 135–145)

## 2017-08-25 LAB — CBC
HCT: 33.7 % — ABNORMAL LOW (ref 39.0–52.0)
Hemoglobin: 11 g/dL — ABNORMAL LOW (ref 13.0–17.0)
MCH: 32.6 pg (ref 26.0–34.0)
MCHC: 32.6 g/dL (ref 30.0–36.0)
MCV: 100 fL (ref 78.0–100.0)
PLATELETS: 127 10*3/uL — AB (ref 150–400)
RBC: 3.37 MIL/uL — ABNORMAL LOW (ref 4.22–5.81)
RDW: 12.9 % (ref 11.5–15.5)
WBC: 5.8 10*3/uL (ref 4.0–10.5)

## 2017-08-25 LAB — B. BURGDORFI ANTIBODIES

## 2017-08-25 LAB — LIPID PANEL
CHOL/HDL RATIO: 3 ratio
CHOLESTEROL: 77 mg/dL (ref 0–200)
HDL: 26 mg/dL — ABNORMAL LOW (ref >40)
LDL CALC: 42 mg/dL (ref 0–99)
Triglycerides: 45 mg/dL (ref <150)
VLDL: 9 mg/dL (ref 0–40)

## 2017-08-25 LAB — APTT: aPTT: 96 seconds — ABNORMAL HIGH (ref 24–36)

## 2017-08-25 LAB — HEPARIN LEVEL (UNFRACTIONATED): Heparin Unfractionated: 0.53 IU/mL (ref 0.30–0.70)

## 2017-08-25 SURGERY — LEFT HEART CATH AND CORONARY ANGIOGRAPHY
Anesthesia: LOCAL

## 2017-08-25 MED ORDER — SODIUM CHLORIDE 0.9% FLUSH
3.0000 mL | Freq: Two times a day (BID) | INTRAVENOUS | Status: DC
  Administered 2017-08-25: 3 mL via INTRAVENOUS

## 2017-08-25 MED ORDER — HEPARIN SODIUM (PORCINE) 1000 UNIT/ML IJ SOLN
INTRAMUSCULAR | Status: DC | PRN
  Administered 2017-08-25: 6000 [IU] via INTRAVENOUS

## 2017-08-25 MED ORDER — ACETAMINOPHEN 325 MG PO TABS
650.0000 mg | ORAL_TABLET | ORAL | Status: DC | PRN
  Administered 2017-08-25: 650 mg via ORAL
  Filled 2017-08-25: qty 2

## 2017-08-25 MED ORDER — FENTANYL CITRATE (PF) 100 MCG/2ML IJ SOLN
INTRAMUSCULAR | Status: DC | PRN
  Administered 2017-08-25: 25 ug via INTRAVENOUS

## 2017-08-25 MED ORDER — FENTANYL CITRATE (PF) 100 MCG/2ML IJ SOLN
INTRAMUSCULAR | Status: AC
  Filled 2017-08-25: qty 2

## 2017-08-25 MED ORDER — VERAPAMIL HCL 2.5 MG/ML IV SOLN
INTRAVENOUS | Status: AC
  Filled 2017-08-25: qty 2

## 2017-08-25 MED ORDER — HEPARIN (PORCINE) IN NACL 1000-0.9 UT/500ML-% IV SOLN
INTRAVENOUS | Status: AC
  Filled 2017-08-25: qty 500

## 2017-08-25 MED ORDER — CALCIUM CARBONATE 1250 (500 CA) MG PO TABS
1.0000 | ORAL_TABLET | Freq: Three times a day (TID) | ORAL | Status: DC
  Administered 2017-08-25 – 2017-08-26 (×2): 500 mg via ORAL
  Filled 2017-08-25 (×2): qty 1

## 2017-08-25 MED ORDER — HEPARIN (PORCINE) IN NACL 1000-0.9 UT/500ML-% IV SOLN
INTRAVENOUS | Status: DC | PRN
  Administered 2017-08-25 (×2): 500 mL

## 2017-08-25 MED ORDER — IOHEXOL 350 MG/ML SOLN
INTRAVENOUS | Status: DC | PRN
  Administered 2017-08-25: 85 mL via INTRAVENOUS

## 2017-08-25 MED ORDER — ONDANSETRON HCL 4 MG/2ML IJ SOLN: 4.0000 mg | Freq: Four times a day (QID) | INTRAMUSCULAR | Status: DC | PRN

## 2017-08-25 MED ORDER — POLYETHYLENE GLYCOL 3350 17 G PO PACK
17.0000 g | PACK | Freq: Every day | ORAL | Status: DC
  Filled 2017-08-25 (×2): qty 1

## 2017-08-25 MED ORDER — SODIUM CHLORIDE 0.9 % IV SOLN: INTRAVENOUS | Status: AC

## 2017-08-25 MED ORDER — LIDOCAINE HCL (PF) 1 % IJ SOLN
INTRAMUSCULAR | Status: AC
  Filled 2017-08-25: qty 30

## 2017-08-25 MED ORDER — MIDAZOLAM HCL 2 MG/2ML IJ SOLN
INTRAMUSCULAR | Status: DC | PRN
  Administered 2017-08-25: 2 mg via INTRAVENOUS

## 2017-08-25 MED ORDER — LIDOCAINE HCL (PF) 1 % IJ SOLN
INTRAMUSCULAR | Status: DC | PRN
  Administered 2017-08-25: 2 mL via INTRADERMAL

## 2017-08-25 MED ORDER — SODIUM CHLORIDE 0.9 % IV SOLN: 250.0000 mL | INTRAVENOUS | Status: DC | PRN

## 2017-08-25 MED ORDER — SODIUM CHLORIDE 0.9% FLUSH: 3.0000 mL | INTRAVENOUS | Status: DC | PRN

## 2017-08-25 MED ORDER — MIDAZOLAM HCL 2 MG/2ML IJ SOLN
INTRAMUSCULAR | Status: AC
  Filled 2017-08-25: qty 2

## 2017-08-25 MED ORDER — HEPARIN SODIUM (PORCINE) 1000 UNIT/ML IJ SOLN
INTRAMUSCULAR | Status: AC
  Filled 2017-08-25: qty 1

## 2017-08-25 SURGICAL SUPPLY — 10 items
CATH INFINITI 5FR JL4 (CATHETERS) ×1 IMPLANT
CATH INFINITI JR5 6F 100CM (CATHETERS) ×1 IMPLANT
DEVICE RAD COMP TR BAND LRG (VASCULAR PRODUCTS) ×1 IMPLANT
GLIDESHEATH SLEND SS 6F .021 (SHEATH) ×1 IMPLANT
GUIDEWIRE INQWIRE 1.5J.035X260 (WIRE) IMPLANT
INQWIRE 1.5J .035X260CM (WIRE) ×2
KIT HEART LEFT (KITS) ×2 IMPLANT
PACK CARDIAC CATHETERIZATION (CUSTOM PROCEDURE TRAY) ×2 IMPLANT
TRANSDUCER W/STOPCOCK (MISCELLANEOUS) ×2 IMPLANT
TUBING CIL FLEX 10 FLL-RA (TUBING) ×2 IMPLANT

## 2017-08-25 NOTE — Progress Notes (Addendum)
PROGRESS NOTE    Curtis Barton  [id number] DOB: 1955/08/24 DOA: 08/23/2017 PCP: Kaleen MaskElkins, Wilson Whitehouse, MD  Brief Narrative: Curtis EagleShannon H Gonyea is a 62 y.o. male with medical history significant of CAD, HTN, OSA, infrequent A.Flutter on eliquis.  Patient had PCI/DES to mLAD in 01/2017 due to decreased exercise tolerance.  Patient presented to ED 7/13 with c/o CP.  Patient report sudden onset of left-sided chest pain   Assessment & Plan:   1.CAD with unstable angina -History of PCI/DES to mid LAD in 01/2017 -troponins negative, nonspecific ST-T wave changes noted -cardiology consulting -Start aspirin, metoprolol, stopped Eliquis-> transitioned over to IV heparin -Plan for left heart catheterization today -no further chest pain  2. Atrial flutter -In sinus rhythm at this time -Eliquis held, IV heparin -Continue beta blocker  3. Hypertension -continue metoprolol, lisinopril   4. History of tick bites -Follow-up Lyme and RMSF titres -low grade temp last pm, monitor  5. Hypocalcemia -likely due to current infusions, Ca was normal in 12/2016 -add Po Cal due to risks with IV Ca in setting of current cardiac issues  DVT prophylaxis: Heparin Code Status: Full Code Family Communication:no family at bedside Disposition Plan: home pending cardiac workup  Consultants:   cardiology   Procedures:   Antimicrobials:    Subjective: -denies any chest pain this morning  Objective: Vitals:   08/24/17 2050 08/24/17 2100 08/24/17 2205 08/25/17 0458  BP: 123/77 123/77 123/77 (!) 146/94  Pulse:   66 70  Resp:    18  Temp:    (!) 100.8 F (38.2 C)  TempSrc:    Oral  SpO2:    98%  Weight:    122.9 kg (271 lb)  Height:        Intake/Output Summary (Last 24 hours) at 08/25/2017 1139 Last data filed at 08/25/2017 0000 Gross per 24 hour  Intake 693.07 ml  Output -  Net 693.07 ml   Filed Weights   08/23/17 2056 08/24/17 0028 08/25/17 0458  Weight: 131.5 kg (290 lb) 131.9  kg (290 lb 12.8 oz) 122.9 kg (271 lb)    Examination:  General exam: Appears calm and comfortable  Respiratory system: Clear to auscultation. Respiratory effort normal. Cardiovascular system: S1 & S2 heard, RRR. No JVD, murmurs, rubs, gallops Gastrointestinal system: Abdomen is nondistended, soft and nontender.Normal bowel sounds heard. Central nervous system: Alert and oriented. No focal neurological deficits. Extremities: Symmetric 5 x 5 power. Skin: No rashes, lesions or ulcers Psychiatry: Judgement and insight appear normal. Mood & affect appropriate.     Data Reviewed:   CBC: Recent Labs  Lab 08/23/17 2107 08/25/17 0640  WBC 6.9 5.8  HGB 13.7 11.0*  HCT 41.9 33.7*  MCV 100.0 100.0  PLT 183 127*   Basic Metabolic Panel: Recent Labs  Lab 08/23/17 2107 08/25/17 0640  NA 134* 141  K 4.3 3.4*  CL 102 115*  CO2 23 22  GLUCOSE 95 84  BUN 12 8  CREATININE 1.01 0.76  CALCIUM 8.8* 6.4*   GFR: Estimated Creatinine Clearance: 138.8 mL/min (by C-G formula based on SCr of 0.76 mg/dL). Liver Function Tests: No results for input(s): AST, ALT, ALKPHOS, BILITOT, PROT, ALBUMIN in the last 168 hours. No results for input(s): LIPASE, AMYLASE in the last 168 hours. No results for input(s): AMMONIA in the last 168 hours. Coagulation Profile: Recent Labs  Lab 08/24/17 2112  INR 1.14   Cardiac Enzymes: Recent Labs  Lab 08/24/17 0331 08/24/17 0659 08/24/17 1110 08/24/17 1654  08/24/17 2004  TROPONINI <0.03 <0.03 <0.03 <0.03 <0.03   BNP (last 3 results) No results for input(s): PROBNP in the last 8760 hours. HbA1C: No results for input(s): HGBA1C in the last 72 hours. CBG: No results for input(s): GLUCAP in the last 168 hours. Lipid Profile: Recent Labs    08/25/17 0833  CHOL 77  HDL 26*  LDLCALC 42  TRIG 45  CHOLHDL 3.0   Thyroid Function Tests: No results for input(s): TSH, T4TOTAL, FREET4, T3FREE, THYROIDAB in the last 72 hours. Anemia Panel: No results  for input(s): VITAMINB12, FOLATE, FERRITIN, TIBC, IRON, RETICCTPCT in the last 72 hours. Urine analysis:    Component Value Date/Time   COLORURINE YELLOW 11/21/2014 1236   APPEARANCEUR CLEAR 11/21/2014 1236   LABSPEC 1.007 11/21/2014 1236   PHURINE 6.0 11/21/2014 1236   GLUCOSEU NEGATIVE 11/21/2014 1236   HGBUR NEGATIVE 11/21/2014 1236   BILIRUBINUR NEGATIVE 11/21/2014 1236   KETONESUR NEGATIVE 11/21/2014 1236   PROTEINUR NEGATIVE 11/21/2014 1236   UROBILINOGEN 0.2 11/21/2014 1236   NITRITE NEGATIVE 11/21/2014 1236   LEUKOCYTESUR NEGATIVE 11/21/2014 1236   Sepsis Labs: @LABRCNTIP (procalcitonin:4,lacticidven:4)  )No results found for this or any previous visit (from the past 240 hour(s)).       Radiology Studies: Dg Chest 2 View  Result Date: 08/23/2017 CLINICAL DATA:  62 year old male with chest pain. EXAM: CHEST - 2 VIEW COMPARISON:  Chest radiograph dated 11/17/2014 FINDINGS: There is mild background of centrilobular emphysema. Bilateral upper lobe and biapical subpleural thickening. There is no focal consolidation, pleural effusion, or pneumothorax. The cardiac silhouette is within normal limits. A loop recorder device noted. No acute osseous pathology. IMPRESSION: No active cardiopulmonary disease. Electronically Signed   By: Elgie Collard M.D.   On: 08/23/2017 21:41        Scheduled Meds: . aspirin EC  81 mg Oral Daily  . atorvastatin  20 mg Oral Daily  . calcium carbonate  1 tablet Oral TID WC  . lisinopril  10 mg Oral Daily  . metoprolol tartrate  25 mg Oral BID  . polyethylene glycol  17 g Oral Daily  . prednisoLONE acetate  1 drop Left Eye Daily  . senna-docusate  1 tablet Oral BID   Continuous Infusions: . sodium chloride Stopped (08/24/17 1519)  . sodium chloride 1 mL/kg/hr (08/25/17 1117)  . heparin 1,700 Units/hr (08/25/17 0000)  . nitroGLYCERIN 5 mcg/min (08/25/17 0000)     LOS: 1 day    Time spent:    Zannie Cove, MD [facility] Page via [email or web address], password [email or web address] After 7PM please contact night-coverage  08/25/2017, 11:39 AM

## 2017-08-25 NOTE — H&P (View-Only) (Signed)
 Progress Note  Patient Name: Curtis Barton Date of Encounter: 08/25/2017  Primary Cardiologist: Silveria Botz, MD   Subjective   Pt has not had chest pain since starting IV heparin.  Inpatient Medications    Scheduled Meds: . aspirin EC  81 mg Oral Daily  . atorvastatin  20 mg Oral Daily  . lisinopril  10 mg Oral Daily  . metoprolol tartrate  25 mg Oral BID  . polyethylene glycol  17 g Oral Daily  . prednisoLONE acetate  1 drop Left Eye Daily  . senna-docusate  1 tablet Oral BID   Continuous Infusions: . sodium chloride Stopped (08/24/17 1519)  . sodium chloride 1 mL/kg/hr (08/25/17 0724)  . heparin 1,700 Units/hr (08/25/17 0000)  . nitroGLYCERIN 5 mcg/min (08/25/17 0000)   PRN Meds: acetaminophen, nitroGLYCERIN, ondansetron (ZOFRAN) IV   Vital Signs    Vitals:   08/24/17 2050 08/24/17 2100 08/24/17 2205 08/25/17 0458  BP: 123/77 123/77 123/77 (!) 146/94  Pulse:   66 70  Resp:    18  Temp:    (!) 100.8 F (38.2 C)  TempSrc:    Oral  SpO2:    98%  Weight:    271 lb (122.9 kg)  Height:        Intake/Output Summary (Last 24 hours) at 08/25/2017 0809 Last data filed at 08/25/2017 0000 Gross per 24 hour  Intake 1293.07 ml  Output -  Net 1293.07 ml   Filed Weights   08/23/17 2056 08/24/17 0028 08/25/17 0458  Weight: 290 lb (131.5 kg) 290 lb 12.8 oz (131.9 kg) 271 lb (122.9 kg)    Telemetry    sinus - Personally Reviewed  ECG    sinus - Personally Reviewed  Physical Exam   GEN: No acute distress.   Neck: No JVD Cardiac: RRR, no murmurs, rubs, or gallops.  Respiratory: Clear to auscultation bilaterally. GI: Soft, nontender, non-distended  MS: No edema; No deformity. Neuro:  Nonfocal  Psych: Normal affect   Labs    Chemistry Recent Labs  Lab 08/23/17 2107  NA 134*  K 4.3  CL 102  CO2 23  GLUCOSE 95  BUN 12  CREATININE 1.01  CALCIUM 8.8*  GFRNONAA >60  GFRAA >60  ANIONGAP 9     Hematology Recent Labs  Lab 08/23/17 2107  08/25/17 0640  WBC 6.9 5.8  RBC 4.19* 3.37*  HGB 13.7 11.0*  HCT 41.9 33.7*  MCV 100.0 100.0  MCH 32.7 32.6  MCHC 32.7 32.6  RDW 12.8 12.9  PLT 183 127*    Cardiac Enzymes Recent Labs  Lab 08/24/17 0659 08/24/17 1110 08/24/17 1654 08/24/17 2004  TROPONINI <0.03 <0.03 <0.03 <0.03    Recent Labs  Lab 08/23/17 2116  TROPIPOC 0.00     BNPNo results for input(s): BNP, PROBNP in the last 168 hours.   DDimer  Recent Labs  Lab 08/23/17 2146  DDIMER 0.36     Radiology    Dg Chest 2 View  Result Date: 08/23/2017 CLINICAL DATA:  62-year-old male with chest pain. EXAM: CHEST - 2 VIEW COMPARISON:  Chest radiograph dated 11/17/2014 FINDINGS: There is mild background of centrilobular emphysema. Bilateral upper lobe and biapical subpleural thickening. There is no focal consolidation, pleural effusion, or pneumothorax. The cardiac silhouette is within normal limits. A loop recorder device noted. No acute osseous pathology. IMPRESSION: No active cardiopulmonary disease. Electronically Signed   By: Arash  Radparvar M.D.   On: 08/23/2017 21:41    Cardiac Studies     R/L Cardiac catheterization 01/13/2017 LAD mid 75, distal 50 LCx mid 50 RCA proximal 25 EF 55-65 Right heart pressures:  Ao sat 98%; PA sat 69%; CO 5.8 L/min; CI 2.28. Normal PA pressures. PCWP mean 13 mm Hg. PCI: 3 x 24 mm Synergy DES to the mid LAD  Nuclear stress test 11/08/2016 EF 62, Low risk stress nuclear study with normal perfusion and normal left ventricular regional and global systolic function.  Echocardiogram 11/05/2016 - Left ventricle: Wall thickness was increased in a pattern of mild LVH. Systolic function was normal. The estimated ejection fraction was in the range of 60% to 65%. Doppler parameters are consistent with abnormal left ventricular relaxation (grade 1 diastolic dysfunction). - Aortic valve: There was trivial regurgitation. - Right ventricle: The cavity size was mildly dilated. -  Tricuspid valve: There was moderate regurgitation.  Impressions: - Compared to the prior study, there has been no significant interval change.  Patient Profile     62 y.o. male with a hx of coronary artery disease status post drug-eluting stent to the LAD in December 2018, atrial flutter status post ablation, prior TIA, hypertension, palpitations and near syncope status post ILR in August 2016 who is being seen for recurrence of chest pain.   Assessment & Plan    1. CAD, unstable angina - s/p DES to mid LAD 01/13/17 - pt states IV nitro has helped chest pain - troponin x 5 negative - EKG without signs of acute ischemia - given his history of CAD, recent stent and  History of ISR - will plan for repeat heart cath today   2. Atrial fibrillation s/p ablation - last dose of eliquis was 08/24/17 at 0055 - sinus on telemetry - IV heparin   3. HTN - lisinopril and lopressor (added yesterday) - pressures were well-controlled overnight   4. HLD - continue statin - lipitor 20 mg - collect repeat lipids   5. Hypocalcemia - Ca 6.4 - per primary team     For questions or updates, please contact [facility] Please consult [email or web address] for contact info under Cardiology/STEMI.      Signed, Angela Nicole Duke, PA  08/25/2017, 8:09 AM    I have examined the patient and reviewed assessment and plan and discussed with patient.  Agree with above as stated.  CAD noted in the past.  Some atypical sx.  PCI of the LAD in 12/18.  More distal lesion medically managed. Plan for cath today.   Mylisa Brunson  

## 2017-08-25 NOTE — Progress Notes (Signed)
ANTICOAGULATION CONSULT NOTE  Pharmacy Consult for Heparin Indication: chest pain/ACS  Allergies  Allergen Reactions  . Nucynta [Tapentadol] Itching  . Codeine Itching  . Hydrocodone Itching  . Oxycodone Itching    Patient Measurements: Height: 6\' 4"  (193 cm) Weight: 271 lb (122.9 kg) IBW/kg (Calculated) : 86.8 Heparin Dosing Weight: 115.5kg  Vital Signs: Temp: 100.8 F (38.2 C) (07/15 0458) Temp Source: Oral (07/15 0458) BP: 146/94 (07/15 0458) Pulse Rate: 70 (07/15 0458)  Labs: Recent Labs    08/23/17 2107  08/24/17 1110 08/24/17 1654 08/24/17 2004 08/24/17 2112 08/25/17 0640  HGB 13.7  --   --   --   --   --  11.0*  HCT 41.9  --   --   --   --   --  33.7*  PLT 183  --   --   --   --   --  127*  APTT  --   --   --   --   --  67* 96*  LABPROT  --   --   --   --   --  14.6  --   INR  --   --   --   --   --  1.14  --   HEPARINUNFRC  --   --   --   --   --  0.81* 0.53  CREATININE 1.01  --   --   --   --   --  0.76  TROPONINI  --    < > <0.03 <0.03 <0.03  --   --    < > = values in this interval not displayed.    Estimated Creatinine Clearance: 138.8 mL/min (by C-G formula based on SCr of 0.76 mg/dL).   Medical History: Past Medical History:  Diagnosis Date  . Arthritis   . Asthma    as child- no problem  . Atrial flutter (HCC)    a. s/p ablation  . CAD (coronary artery disease)    12/18 PCI/DES x1 to mLAD, normal EF  . Difficult intubation    maybe 8 years years ago, no trouble since then  . Fatty liver   . Fuchs' uveitis syndrome    left eye  . History of kidney stones   . Hypertension   . Primary localized osteoarthritis of right knee   . Sleep apnea    home sleep study shows some sleep apnea  . Stroke (HCC)   . TIA (transient ischemic attack) 2010    Assessment: 62 YO M with hx of infrequent Aflutter on Eliquis PTA and CAD [facility]. Last dose of Eliquis 7/14 0055. Pharmacy consulted to switch to heparin prior to cath scheduled  7/15. Will monitor aPTT and heparin levels until correlate.   aPTT therapeutic at 96, heparin level also therapeutic at 0.53. No s/sx of bleeding. Hg down down to 11, plt down to 127.  Goal of Therapy:  Heparin level 0.3-0.7 units/ml aPTT 66-102 seconds Monitor platelets by anticoagulation protocol: Yes   Plan:  -Continue heparin infusion at 1700 unit/hr  -Daily aPTT, heparin level, CBC -Monitor s/sx bleeding  -F/u plans to resume apixaban post-cath  Babs BertinHaley Adalia Pettis, PharmD, BCPS Clinical Pharmacist Clinical phone [phone] Please check AMION for all [facility] contact numbers 08/25/2017 9:20 AM

## 2017-08-25 NOTE — Interval H&P Note (Signed)
Cath Lab Visit (complete for each Cath Lab visit)  Clinical Evaluation Leading to the Procedure:   ACS: Yes.    Non-ACS:    Anginal Classification: CCS IV  Anti-ischemic medical therapy: Minimal Therapy (1 class of medications)  Non-Invasive Test Results: No non-invasive testing performed  Prior CABG: No previous CABG      History and Physical Interval Note:  08/25/2017 4:39 PM  Curtis Barton  has presented today for surgery, with the diagnosis of unstable angina  The various methods of treatment have been discussed with the patient and family. After consideration of risks, benefits and other options for treatment, the patient has consented to  Procedure(s): LEFT HEART CATH AND CORONARY ANGIOGRAPHY (N/A) as a surgical intervention .  The patient's history has been reviewed, patient examined, no change in status, stable for surgery.  I have reviewed the patient's chart and labs.  Questions were answered to the patient's satisfaction.     Lance MussJayadeep Varanasi

## 2017-08-25 NOTE — Progress Notes (Signed)
CRITICAL VALUE ALERT  Critical Value:  Calcium 6.4  Date & Time Notied:  08/25/2017  16100812  Provider Notified: Dr. Mitchel HonourP. Joseph  Orders Received/Actions taken: yes

## 2017-08-25 NOTE — Progress Notes (Addendum)
Progress Note  Patient Name: Curtis Barton Date of Encounter: 08/25/2017  Primary Cardiologist: Lance Muss, MD   Subjective   Pt has not had chest pain since starting IV heparin.  Inpatient Medications    Scheduled Meds: . aspirin EC  81 mg Oral Daily  . atorvastatin  20 mg Oral Daily  . lisinopril  10 mg Oral Daily  . metoprolol tartrate  25 mg Oral BID  . polyethylene glycol  17 g Oral Daily  . prednisoLONE acetate  1 drop Left Eye Daily  . senna-docusate  1 tablet Oral BID   Continuous Infusions: . sodium chloride Stopped (08/24/17 1519)  . sodium chloride 1 mL/kg/hr (08/25/17 0724)  . heparin 1,700 Units/hr (08/25/17 0000)  . nitroGLYCERIN 5 mcg/min (08/25/17 0000)   PRN Meds: acetaminophen, nitroGLYCERIN, ondansetron (ZOFRAN) IV   Vital Signs    Vitals:   08/24/17 2050 08/24/17 2100 08/24/17 2205 08/25/17 0458  BP: 123/77 123/77 123/77 (!) 146/94  Pulse:   66 70  Resp:    18  Temp:    (!) 100.8 F (38.2 C)  TempSrc:    Oral  SpO2:    98%  Weight:    271 lb (122.9 kg)  Height:        Intake/Output Summary (Last 24 hours) at 08/25/2017 0809 Last data filed at 08/25/2017 0000 Gross per 24 hour  Intake 1293.07 ml  Output -  Net 1293.07 ml   Filed Weights   08/23/17 2056 08/24/17 0028 08/25/17 0458  Weight: 290 lb (131.5 kg) 290 lb 12.8 oz (131.9 kg) 271 lb (122.9 kg)    Telemetry    sinus - Personally Reviewed  ECG    sinus - Personally Reviewed  Physical Exam   GEN: No acute distress.   Neck: No JVD Cardiac: RRR, no murmurs, rubs, or gallops.  Respiratory: Clear to auscultation bilaterally. GI: Soft, nontender, non-distended  MS: No edema; No deformity. Neuro:  Nonfocal  Psych: Normal affect   Labs    Chemistry Recent Labs  Lab 08/23/17 2107  NA 134*  K 4.3  CL 102  CO2 23  GLUCOSE 95  BUN 12  CREATININE 1.01  CALCIUM 8.8*  GFRNONAA >60  GFRAA >60  ANIONGAP 9     Hematology Recent Labs  Lab 08/23/17 2107  08/25/17 0640  WBC 6.9 5.8  RBC 4.19* 3.37*  HGB 13.7 11.0*  HCT 41.9 33.7*  MCV 100.0 100.0  MCH 32.7 32.6  MCHC 32.7 32.6  RDW 12.8 12.9  PLT 183 127*    Cardiac Enzymes Recent Labs  Lab 08/24/17 0659 08/24/17 1110 08/24/17 1654 08/24/17 2004  TROPONINI <0.03 <0.03 <0.03 <0.03    Recent Labs  Lab 08/23/17 2116  TROPIPOC 0.00     BNPNo results for input(s): BNP, PROBNP in the last 168 hours.   DDimer  Recent Labs  Lab 08/23/17 2146  DDIMER 0.36     Radiology    Dg Chest 2 View  Result Date: 08/23/2017 CLINICAL DATA:  62 year old male with chest pain. EXAM: CHEST - 2 VIEW COMPARISON:  Chest radiograph dated 11/17/2014 FINDINGS: There is mild background of centrilobular emphysema. Bilateral upper lobe and biapical subpleural thickening. There is no focal consolidation, pleural effusion, or pneumothorax. The cardiac silhouette is within normal limits. A loop recorder device noted. No acute osseous pathology. IMPRESSION: No active cardiopulmonary disease. Electronically Signed   By: Elgie Collard M.D.   On: 08/23/2017 21:41    Cardiac Studies  R/L Cardiac catheterization 01/13/2017 LAD mid 75, distal 50 LCx mid 50 RCA proximal 25 EF 55-65 Right heart pressures:  Ao sat 98%; PA sat 69%; CO 5.8 L/min; CI 2.28. Normal PA pressures. PCWP mean 13 mm Hg. PCI: 3 x 24 mm Synergy DES to the mid LAD  Nuclear stress test 11/08/2016 EF 62, Low risk stress nuclear study with normal perfusion and normal left ventricular regional and global systolic function.  Echocardiogram 11/05/2016 - Left ventricle: Wall thickness was increased in a pattern of mild LVH. Systolic function was normal. The estimated ejection fraction was in the range of 60% to 65%. Doppler parameters are consistent with abnormal left ventricular relaxation (grade 1 diastolic dysfunction). - Aortic valve: There was trivial regurgitation. - Right ventricle: The cavity size was mildly dilated. -  Tricuspid valve: There was moderate regurgitation.  Impressions: - Compared to the prior study, there has been no significant interval change.  Patient Profile     62 y.o. male with a hx of coronary artery disease status post drug-eluting stent to the LAD in December 2018, atrial flutter status post ablation, prior TIA, hypertension, palpitations and near syncope status post ILR in August 2016 who is being seen for recurrence of chest pain.   Assessment & Plan    1. CAD, unstable angina - s/p DES to mid LAD 01/13/17 - pt states IV nitro has helped chest pain - troponin x 5 negative - EKG without signs of acute ischemia - given his history of CAD, recent stent and  History of ISR - will plan for repeat heart cath today   2. Atrial fibrillation s/p ablation - last dose of eliquis was 08/24/17 at 0055 - sinus on telemetry - IV heparin   3. HTN - lisinopril and lopressor (added yesterday) - pressures were well-controlled overnight   4. HLD - continue statin - lipitor 20 mg - collect repeat lipids   5. Hypocalcemia - Ca 6.4 - per primary team     For questions or updates, please contact [facility] Please consult [email or web address] for contact info under Cardiology/STEMI.      Signed, Roe Rutherfordngela Nicole Duke, PA  08/25/2017, 8:09 AM    I have examined the patient and reviewed assessment and plan and discussed with patient.  Agree with above as stated.  CAD noted in the past.  Some atypical sx.  PCI of the LAD in 12/18.  More distal lesion medically managed. Plan for cath today.   Lance MussJayadeep Tarek Cravens

## 2017-08-26 ENCOUNTER — Encounter (HOSPITAL_COMMUNITY): Payer: Self-pay | Admitting: Interventional Cardiology

## 2017-08-26 DIAGNOSIS — Z7901 Long term (current) use of anticoagulants: Secondary | ICD-10-CM

## 2017-08-26 DIAGNOSIS — W57XXXA Bitten or stung by nonvenomous insect and other nonvenomous arthropods, initial encounter: Secondary | ICD-10-CM

## 2017-08-26 LAB — ROCKY MTN SPOTTED FVR ABS PNL(IGG+IGM)
RMSF IgG: POSITIVE — AB
RMSF IgM: 0.16 index (ref 0.00–0.89)

## 2017-08-26 LAB — CBC
HEMATOCRIT: 37.9 % — AB (ref 39.0–52.0)
Hemoglobin: 12.5 g/dL — ABNORMAL LOW (ref 13.0–17.0)
MCH: 32.7 pg (ref 26.0–34.0)
MCHC: 33 g/dL (ref 30.0–36.0)
MCV: 99.2 fL (ref 78.0–100.0)
Platelets: 141 10*3/uL — ABNORMAL LOW (ref 150–400)
RBC: 3.82 MIL/uL — ABNORMAL LOW (ref 4.22–5.81)
RDW: 13.1 % (ref 11.5–15.5)
WBC: 6.9 10*3/uL (ref 4.0–10.5)

## 2017-08-26 LAB — RMSF, IGG, IFA: RMSF, IGG, IFA: <1:64 {titer}

## 2017-08-26 LAB — BASIC METABOLIC PANEL
Anion gap: 6 (ref 5–15)
BUN: 13 mg/dL (ref 8–23)
CALCIUM: 8.6 mg/dL — AB (ref 8.9–10.3)
CO2: 26 mmol/L (ref 22–32)
CREATININE: 1.08 mg/dL (ref 0.61–1.24)
Chloride: 105 mmol/L (ref 98–111)
GFR calc Af Amer: >60 mL/min (ref >60)
GFR calc non Af Amer: >60 mL/min (ref >60)
GLUCOSE: 103 mg/dL — AB (ref 70–99)
Potassium: 4.1 mmol/L (ref 3.5–5.1)
Sodium: 137 mmol/L (ref 135–145)

## 2017-08-26 MED ORDER — METOPROLOL TARTRATE 25 MG PO TABS: 25.0000 mg | ORAL_TABLET | Freq: Two times a day (BID) | ORAL | 0 refills | Status: DC

## 2017-08-26 MED ORDER — DOXYCYCLINE HYCLATE 100 MG PO TABS: 100.0000 mg | ORAL_TABLET | Freq: Two times a day (BID) | ORAL | 0 refills | Status: DC

## 2017-08-26 MED ORDER — DOXYCYCLINE HYCLATE 100 MG PO TABS
100.0000 mg | ORAL_TABLET | Freq: Two times a day (BID) | ORAL | Status: DC
  Administered 2017-08-26: 100 mg via ORAL
  Filled 2017-08-26: qty 1

## 2017-08-26 MED ORDER — APIXABAN 5 MG PO TABS
5.0000 mg | ORAL_TABLET | Freq: Two times a day (BID) | ORAL | Status: DC
  Administered 2017-08-26: 5 mg via ORAL
  Filled 2017-08-26: qty 1

## 2017-08-26 MED ORDER — ACETAMINOPHEN 325 MG PO TABS: 650.0000 mg | ORAL_TABLET | ORAL | Status: AC | PRN

## 2017-08-26 MED FILL — Verapamil HCl IV Soln 2.5 MG/ML: INTRAVENOUS | Qty: 2 | Status: AC

## 2017-08-26 NOTE — Progress Notes (Addendum)
Progress Note  Patient Name: Curtis Barton Date of Encounter: 08/26/2017  Primary Cardiologist: Lance MussJayadeep Shemar Plemmons, MD   Subjective   Pt denies chest pain. Ready for discharge.  Inpatient Medications    Scheduled Meds: . apixaban  5 mg Oral BID  . atorvastatin  20 mg Oral Daily  . calcium carbonate  1 tablet Oral TID WC  . doxycycline  100 mg Oral Q12H  . lisinopril  10 mg Oral Daily  . metoprolol tartrate  25 mg Oral BID  . polyethylene glycol  17 g Oral Daily  . prednisoLONE acetate  1 drop Left Eye Daily  . senna-docusate  1 tablet Oral BID  . sodium chloride flush  3 mL Intravenous Q12H   Continuous Infusions: . sodium chloride Stopped (08/24/17 1519)  . sodium chloride     PRN Meds: sodium chloride, acetaminophen, nitroGLYCERIN, ondansetron (ZOFRAN) IV, sodium chloride flush   Vital Signs    Vitals:   08/25/17 2040 08/25/17 2355 08/26/17 0500 08/26/17 1022  BP:   110/76 118/75  Pulse:   65 76  Resp:   20   Temp: 99.7 F (37.6 C) (!) 100.9 F (38.3 C) 98 F (36.7 C) 99.4 F (37.4 C)  TempSrc: Oral Oral Oral Oral  SpO2:   98% 96%  Weight:   287 lb 8 oz (130.4 kg)   Height:        Intake/Output Summary (Last 24 hours) at 08/26/2017 1109 Last data filed at 08/26/2017 0600 Gross per 24 hour  Intake 1369.8 ml  Output 325 ml  Net 1044.8 ml   Filed Weights   08/24/17 0028 08/25/17 0458 08/26/17 0500  Weight: 290 lb 12.8 oz (131.9 kg) 271 lb (122.9 kg) 287 lb 8 oz (130.4 kg)    Telemetry    sinus - Personally Reviewed  ECG    No new tracings - Personally Reviewed  Physical Exam   GEN: No acute distress.   Neck: No JVD Cardiac: RRR, no murmurs, rubs, or gallops.  Respiratory: Clear to auscultation bilaterally. GI: Soft, nontender, non-distended  MS: No edema; No deformity. Rash on inside of right thigh/knee, right radial C/D/I Neuro:  Nonfocal  Psych: Normal affect   Labs    Chemistry Recent Labs  Lab 08/23/17 2107 08/25/17 0640  08/26/17 0622  NA 134* 141 137  K 4.3 3.4* 4.1  CL 102 115* 105  CO2 23 22 26   GLUCOSE 95 84 103*  BUN 12 8 13   CREATININE 1.01 0.76 1.08  CALCIUM 8.8* 6.4* 8.6*  GFRNONAA >60 >60 >60  GFRAA >60 >60 >60  ANIONGAP 9 4* 6     Hematology Recent Labs  Lab 08/23/17 2107 08/25/17 0640 08/26/17 0622  WBC 6.9 5.8 6.9  RBC 4.19* 3.37* 3.82*  HGB 13.7 11.0* 12.5*  HCT 41.9 33.7* 37.9*  MCV 100.0 100.0 99.2  MCH 32.7 32.6 32.7  MCHC 32.7 32.6 33.0  RDW 12.8 12.9 13.1  PLT 183 127* 141*    Cardiac Enzymes Recent Labs  Lab 08/24/17 0659 08/24/17 1110 08/24/17 1654 08/24/17 2004  TROPONINI <0.03 <0.03 <0.03 <0.03    Recent Labs  Lab 08/23/17 2116  TROPIPOC 0.00     BNPNo results for input(s): BNP, PROBNP in the last 168 hours.   DDimer  Recent Labs  Lab 08/23/17 2146  DDIMER 0.36     Radiology    No results found.  Cardiac Studies   Left heart cath 08/25/17:  Previously placed Mid LAD drug  eluting stent is widely patent.  Dist LAD lesion is 50% stenosed.  Mid Cx lesion is 50% stenosed.  Prox RCA to Mid RCA lesion is 25% stenosed.  The left ventricular systolic function is normal.  LV end diastolic pressure is mildly elevated.  The left ventricular ejection fraction is 55-65% by visual estimate.  There is mild aortic valve gradient.  If no bleeding issues at the right wrist: Recommend to resume Apixaban, at currently prescribed dose and frequency, on 08/26/17.  Concurrent antiplatelet therapy not recommended.   Patient Profile     62 y.o. male with a hx of coronary artery disease status post drug-eluting stent to the LAD in December 2018, atrial flutter status post ablation, prior TIA, hypertension, palpitations and near syncope status post ILR in August 2016who is being seen for recurrence of chest pain.  Assessment & Plan    1. CAD, unstable angina, s/p DES to mid LAD 01/13/17 - troponin x 5 negative - EKG without signs of acute  ischemia - left heart cath yesterday with stable disease, no intervention - medical therapy recommended - no antiplatelet therapy not recommended in setting of eliquis    2. Atrial fibrillation s/p ablation - last dose of eliquis 08/24/17 at 0055 - heart cath yesterday without bleeding complications - radial access site C/D/I - resume apixaban tonight   3. HTN - pressures well-controlled - continue lisinpril and lopressor    4. HLD 08/25/2017: Cholesterol 77; HDL 26; LDL Cholesterol 42; Triglycerides 45; VLDL 9 - continue lipitor 20 mg - repeat labs in 6 weeks   5. Hypocalcemia - Ca 6.4, 8.6 today - needs to follow up with primary care   7. Question [place] spotted fever - test still pending - headache, fever, plus rash on inside thigh - IM started doxycycline   [facility] will sign off.   Medication Recommendations:  As above  For questions or updates, please contact [facility] Please consult [email or web address] for contact info under Cardiology/STEMI.      Signed, Roe Rutherford Duke, PA  08/26/2017, 11:09 AM    I have examined the patient and reviewed assessment and plan and discussed with patient.  Agree with above as stated.  Right wrist stable.  Cath stable yesterday.  Patent stent.  Restart Eliquis.  OK for discharge.   Lance Muss

## 2017-08-26 NOTE — Discharge Instructions (Addendum)

## 2017-08-26 NOTE — Discharge Summary (Signed)
Physician Discharge Summary  Curtis Barton ZOX:[id number] DOB: Jul 01, 1955 DOA: 08/23/2017  PCP: Curtis Mask, MD  Admit date: 08/23/2017 Discharge date: 08/26/2017  Time spent: 35 minutes  Recommendations for Outpatient Follow-up:  1. Cardiology Dr.Varanasi in one month 2. PCP Dr. Jeannetta Barton in one week, please follow-up [facility] spotted fever serology   Discharge Diagnoses:  Principal Problem:   Chest pain, rule out acute myocardial infarction   Suspected tickborne illness   Atrial flutter (HCC)   Essential hypertension   Coronary artery disease involving native coronary artery of native heart with unstable angina pectoris (HCC)   Tick bites   Anticoagulated   Sleep apnea   Discharge Condition: stable  Diet recommendation: heart healthy  Filed Weights   08/24/17 0028 08/25/17 0458 08/26/17 0500  Weight: 131.9 kg (290 lb 12.8 oz) 122.9 kg (271 lb) 130.4 kg (287 lb 8 oz)    History of present illness:  Curtis Barton a 62 y.o.malewith medical history significant ofCAD, HTN, OSA, infrequent A.Flutter on eliquis. Patient had PCI/DES to mLAD in 01/2017 due to decreased exercise tolerance.  Patient presented to ED 7/13 with c/o CP.Patient reported sudden onset of left-sided chest pain   Hospital Course:   1.CAD with unstable angina -History of PCI/DES to mid LAD in 01/2017 -troponins negative, nonspecific ST-T wave changes noted -cardiology consulted -Started aspirin, metoprolol, stopped Eliquis-> transitioned over to IV heparin -Underwent left heart catheterization yesterday which noted stable disease, no intervention done -no antiplatelet therapy recommended in the setting of Eliquis per Cardiology, continue beta blocker and statin  2. Atrial flutter -In sinus rhythm at this time -Eliquis held, IV heparin used prior to left heart catheterization -Continue beta blocker -resumed Eliquis  3. Hypertension -continue metoprolol, lisinopril    4. History of tick bites -noted to have low-grade fever, generalized malaise and mild cytopenia, suspect tickborne illness, Lyme titers negative, RMSF serology pending at discharge, due to high clinical suspicion started on doxycycline -please FU RMSF titres   Procedures:  Left heart catheterization  Consultations: Cardiology  Discharge Exam: Vitals:   08/26/17 0500 08/26/17 1022  BP: 110/76 118/75  Pulse: 65 76  Resp: 20   Temp: 98 F (36.7 C) 99.4 F (37.4 C)  SpO2: 98% 96%    General: AAOx3 Cardiovascular: S1S2/RRR Respiratory: CTAB  Discharge Instructions   Discharge Instructions    Diet - low sodium heart healthy   Complete by:  As directed    Increase activity slowly   Complete by:  As directed      Allergies as of 08/26/2017      Reactions   Nucynta [tapentadol] Itching   Codeine Itching   Hydrocodone Itching   Oxycodone Itching      Medication List    TAKE these medications   acetaminophen 325 MG tablet Commonly known as:  TYLENOL Take 2 tablets (650 mg total) by mouth every 4 (four) hours as needed for headache or mild pain.   apixaban 5 MG Tabs tablet Commonly known as:  ELIQUIS Take 1 tablet (5 mg total) 2 (two) times daily by mouth.   atorvastatin 20 MG tablet Commonly known as:  LIPITOR Take 1 tablet (20 mg total) by mouth daily.   doxycycline 100 MG tablet Commonly known as:  VIBRA-TABS Take 1 tablet (100 mg total) by mouth every 12 (twelve) hours. For 9days   lisinopril 10 MG tablet Commonly known as:  PRINIVIL,ZESTRIL Take 1 tablet (10 mg total) by mouth daily.   metoprolol  tartrate 25 MG tablet Commonly known as:  LOPRESSOR Take 1 tablet (25 mg total) by mouth 2 (two) times daily.   nitroGLYCERIN 0.4 MG SL tablet Commonly known as:  NITROSTAT Place 1 tablet (0.4 mg total) under the tongue every 5 (five) minutes as needed.   prednisoLONE acetate 1 % ophthalmic suspension Commonly known as:  PRED FORTE Place 1 drop into  the left eye daily.      Allergies  Allergen Reactions  . Nucynta [Tapentadol] Itching  . Codeine Itching  . Hydrocodone Itching  . Oxycodone Itching   Follow-up Information    Curtis Barton, Bhavinkumar, PA Follow up on 09/17/2017.   Specialty:  Cardiology Why:  11:30 am for TCM hospital follow up Contact information: [address] [place] [address] [phone]        Curtis MaskElkins, Curtis Tonkinson, MD. Schedule an appointment as soon as possible for a visit in 1 week(Barton).   Specialty:  Family Medicine Contact information: [address] [place] [address] [phone]        Curtis Barton, Curtis S, MD .   Specialties:  Cardiology, Radiology, Interventional Cardiology Contact information: [address] [place] [address] [phone]            The results of significant diagnostics from this hospitalization (including imaging, microbiology, ancillary and laboratory) are listed below for reference.    Significant Diagnostic Studies: Dg Chest 2 View  Result Date: 08/23/2017 CLINICAL DATA:  62 year old male with chest pain. EXAM: CHEST - 2 VIEW COMPARISON:  Chest radiograph dated 11/17/2014 FINDINGS: There is mild background of centrilobular emphysema. Bilateral upper lobe and biapical subpleural thickening. There is no focal consolidation, pleural effusion, or pneumothorax. The cardiac silhouette is within normal limits. A loop recorder device noted. No acute osseous pathology. IMPRESSION: No active cardiopulmonary disease. Electronically Signed   By: Elgie CollardArash  Curtis Barton M.D.   On: 08/23/2017 21:41    Microbiology: No results found for this or any previous visit (from the past 240 hour(Barton)).   Labs: Basic Metabolic Panel: Recent Labs  Lab 08/23/17 2107 08/25/17 0640 08/26/17 0622  NA 134* 141 137  K 4.3 3.4* 4.1  CL 102 115* 105  CO2 23 22 26   GLUCOSE 95 84 103*  BUN 12 8 13   CREATININE 1.01 0.76 1.08  CALCIUM 8.8* 6.4* 8.6*    Liver Function Tests: No results for input(Barton): AST, ALT, ALKPHOS, BILITOT, PROT, ALBUMIN in the last 168 hours. No results for input(Barton): LIPASE, AMYLASE in the last 168 hours. No results for input(Barton): AMMONIA in the last 168 hours. CBC: Recent Labs  Lab 08/23/17 2107 08/25/17 0640 08/26/17 0622  WBC 6.9 5.8 6.9  HGB 13.7 11.0* 12.5*  HCT 41.9 33.7* 37.9*  MCV 100.0 100.0 99.2  PLT 183 127* 141*   Cardiac Enzymes: Recent Labs  Lab 08/24/17 0331 08/24/17 0659 08/24/17 1110 08/24/17 1654 08/24/17 2004  TROPONINI <0.03 <0.03 <0.03 <0.03 <0.03   BNP: BNP (last 3 results) No results for input(Barton): BNP in the last 8760 hours.  ProBNP (last 3 results) No results for input(Barton): PROBNP in the last 8760 hours.  CBG: No results for input(Barton): GLUCAP in the last 168 hours.     Signed:  Zannie CovePreetha Luan Urbani MD.  [facility] 08/26/2017, 3:10 PM

## 2017-08-26 NOTE — Progress Notes (Signed)
Radial site level 0.

## 2017-08-26 NOTE — Progress Notes (Signed)
ANTICOAGULATION CONSULT NOTE - Initial Consult  Pharmacy Consult for Apixaban Indication: atrial fibrillation/ atrial flutter  Allergies  Allergen Reactions  . Nucynta [Tapentadol] Itching  . Codeine Itching  . Hydrocodone Itching  . Oxycodone Itching    Patient Measurements: Height: 6\' 4"  (193 cm) Weight: 287 lb 8 oz (130.4 kg) IBW/kg (Calculated) : 86.8  Vital Signs: Temp: 98 F (36.7 C) (07/16 0500) Temp Source: Oral (07/16 0500) BP: 110/76 (07/16 0500) Pulse Rate: 65 (07/16 0500)  Labs: Recent Labs    08/23/17 2107  08/24/17 1110 08/24/17 1654 08/24/17 2004 08/24/17 2112 08/25/17 0640 08/26/17 0622  HGB 13.7  --   --   --   --   --  11.0* 12.5*  HCT 41.9  --   --   --   --   --  33.7* 37.9*  PLT 183  --   --   --   --   --  127* 141*  APTT  --   --   --   --   --  67* 96*  --   LABPROT  --   --   --   --   --  14.6  --   --   INR  --   --   --   --   --  1.14  --   --   HEPARINUNFRC  --   --   --   --   --  0.81* 0.53  --   CREATININE 1.01  --   --   --   --   --  0.76 1.08  TROPONINI  --    < > <0.03 <0.03 <0.03  --   --   --    < > = values in this interval not displayed.    Estimated Creatinine Clearance: 105.9 mL/min (by C-G formula based on SCr of 1.08 mg/dL).   Medical History: Past Medical History:  Diagnosis Date  . Arthritis   . Asthma    as child- no problem  . Atrial flutter (HCC)    a. s/p ablation  . CAD (coronary artery disease)    12/18 PCI/DES x1 to mLAD, normal EF  . Difficult intubation    maybe 8 years years ago, no trouble since then  . Fatty liver   . Fuchs' uveitis syndrome    left eye  . History of kidney stones   . Hypertension   . Primary localized osteoarthritis of right knee   . Sleep apnea    home sleep study shows some sleep apnea  . Stroke (HCC)   . TIA (transient ischemic attack) 2010    Assessment: 62 year old male s/p cardiac cath on 7/15 with patent stent to resume Apixaban today per Cardiology  recommendation. Off Heparin since cath yesterday. Pharmacy consult for Apixaban dosing.   Hgb is up today at 12.5. Platelets are up to 141.  SCr is up to 1.08 this AM.  No bleeding reported.  Goal of Therapy:  Monitor platelets by anticoagulation protocol: Yes   Plan:  Resume Apixaban 5mg  po BID.  Monitor CBC and for any signs/symptoms of bleeding.  Please check AMION for all [facility] contact numbers.  Link SnufferJessica Efstathios Sawin, PharmD, BCPS, BCCCP Clinical Pharmacist Clinical phone 08/26/2017 until 3:30PM - #[phone] Please consult AMION for  08/26/2017,9:47 AM

## 2017-08-26 NOTE — Progress Notes (Addendum)
Discharge instructions reviewed with pt. Pt has no questions at this time.  Pt denies any pain and states he is ready for d/c. Prescriptions given to pt.

## 2017-09-07 ENCOUNTER — Other Ambulatory Visit: Payer: Self-pay | Admitting: Internal Medicine

## 2017-09-09 LAB — CUP PACEART REMOTE DEVICE CHECK
Date Time Interrogation Session: 20190701140948
MDC IDC PG IMPLANT DT: 20160831

## 2017-09-15 ENCOUNTER — Ambulatory Visit (INDEPENDENT_AMBULATORY_CARE_PROVIDER_SITE_OTHER): Payer: BLUE CROSS/BLUE SHIELD | Admitting: *Deleted

## 2017-09-15 DIAGNOSIS — R55 Syncope and collapse: Secondary | ICD-10-CM | POA: Diagnosis not present

## 2017-09-16 NOTE — Progress Notes (Signed)
Carelink Summary Report / Loop Recorder 

## 2017-09-17 ENCOUNTER — Ambulatory Visit (INDEPENDENT_AMBULATORY_CARE_PROVIDER_SITE_OTHER): Payer: BLUE CROSS/BLUE SHIELD | Admitting: Physician Assistant

## 2017-09-17 ENCOUNTER — Encounter: Payer: Self-pay | Admitting: Physician Assistant

## 2017-09-17 VITALS — BP 112/72 | HR 69 | Ht 76.0 in | Wt 290.6 lb

## 2017-09-17 DIAGNOSIS — Z9989 Dependence on other enabling machines and devices: Secondary | ICD-10-CM

## 2017-09-17 DIAGNOSIS — I2510 Atherosclerotic heart disease of native coronary artery without angina pectoris: Secondary | ICD-10-CM | POA: Diagnosis not present

## 2017-09-17 DIAGNOSIS — R0609 Other forms of dyspnea: Secondary | ICD-10-CM | POA: Diagnosis not present

## 2017-09-17 DIAGNOSIS — Z9889 Other specified postprocedural states: Secondary | ICD-10-CM | POA: Diagnosis not present

## 2017-09-17 DIAGNOSIS — G4733 Obstructive sleep apnea (adult) (pediatric): Secondary | ICD-10-CM

## 2017-09-17 DIAGNOSIS — I10 Essential (primary) hypertension: Secondary | ICD-10-CM

## 2017-09-17 DIAGNOSIS — Z8679 Personal history of other diseases of the circulatory system: Secondary | ICD-10-CM

## 2017-09-17 DIAGNOSIS — R0600 Dyspnea, unspecified: Secondary | ICD-10-CM

## 2017-09-17 NOTE — Progress Notes (Signed)
Cardiology Office Note    Date:  09/17/2017   ID:  Wai, Litt January 20, 1956, MRN [id number]  PCP:  Kaleen Mask, MD  Cardiologist:  Dr. Eldridge Dace  Electrophysiologist: Dr. Graciela Husbands  Chief Complaint: Hospital follow up   History of Present Illness:   Curtis Barton is a 62 y.o. male with hx of CAD S/p DES to mLAD 01/13/17, atrial flutter s/p ablation 10/2013, prior TIA, OSA on CPAP (compliant) hypertension, HLD, palpitations and near syncope status post ILR in August 2016presents for hospital follow up.   Admitted 08/2017 for chest pain and DOE x 6 weeks concerning for angina.Troponin negative. EKG without acute changes. Cath showed stable disease, no intervention done (details below). Antiplatelet therapy not recommended in setting of eliquis. Also treated with abx of [place] spotted fever.   Here today for follow up.  Patient denies recurrent chest pain however continues to have dyspnea on exertion.  No orthopnea or PND.  Intermittent lower extremity edema however has significant varicose vein.  Compliant with medication.  He was started on Lopressor 25 mg twice daily during admission and started to notice fatigue. Previously his fatigue was completely relieved after discontinuation of metoprolol by Dr. Graciela Husbands on prior visit.  Denies palpitation,, headache or melena.  Compliant with CPAP and medication.  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  Prior history of tobacco smoking.  Past Medical History:  Diagnosis Date  . Arthritis   . Asthma    as child- no problem  . Atrial flutter (HCC)    a. s/p ablation  . CAD (coronary artery disease)    12/18 PCI/DES x1 to mLAD, normal EF  . Difficult intubation    maybe 8 years years ago, no trouble since then  . Fatty liver   . Fuchs' uveitis syndrome    left eye  . History of kidney stones   . Hypertension   . Primary localized osteoarthritis of right knee   . Sleep apnea    home sleep study shows some sleep  apnea  . Stroke (HCC)   . TIA (transient ischemic attack) 2010    Past Surgical History:  Procedure Laterality Date  . acle repair Right 2000   ACL repair  . ATRIAL ABLATION SURGERY  10/2013   Dr Graciela Husbands  . ATRIAL FLUTTER ABLATION N/A 10/13/2013   Procedure: ATRIAL FLUTTER ABLATION;  Surgeon: Duke Salvia, MD;  [facility] LAB;  Service: Cardiovascular;  Laterality: N/A;  . BICEPS TENDON REPAIR    . CATARACT EXTRACTION W/ INTRAOCULAR LENS  IMPLANT, BILATERAL    . CORONARY STENT INTERVENTION N/A 01/13/2017   Procedure: CORONARY STENT INTERVENTION;  Surgeon: Corky Crafts, MD;  Location: [facility] INVASIVE CV LAB;  Service: Cardiovascular;  Laterality: N/A;  . CYSTOSCOPY W/ URETERAL STENT PLACEMENT  1999  . EP IMPLANTABLE DEVICE N/A 10/12/2014   Procedure: Loop Recorder Insertion;  Surgeon: Duke Salvia, MD;  Location[facility]VAS[facility] CV LAB;  Service: Cardiovascular;  Laterality: N/A;  . EYE SURGERY    . HERNIA REPAIR    . KIDNEY SURGERY Right   . KNEE CARTILAGE SURGERY Bilateral   . LEFT HEART CATH AND CORONARY ANGIOGRAPHY N/A 08/25/2017   Procedure: LEFT HEART CATH AND CORONARY ANGIOGRAPHY;  Surgeon: Corky Crafts, MD;  Location: [facility] INVASIVE CV LAB;  Service: Cardiovascular;  Laterality: N/A;  . LEFT HEART CATHETERIZATION WITH CORONARY ANGIOGRAM N/A 05/17/2014   Procedure: LEFT HEART CATHETERIZATION WITH CORONARY ANGIOGRAM;  Surgeon: Corky CraftsJayadeep S Varanasi, MD;  [facility];  Service: Cardiovascular;  Laterality: N/A;  . RIGHT/LEFT HEART CATH AND CORONARY ANGIOGRAPHY N/A 01/13/2017   Procedure: RIGHT/LEFT HEART CATH AND CORONARY ANGIOGRAPHY;  Surgeon: Corky CraftsVaranasi, Jayadeep S, MD;  [facility];  Service: Cardiovascular;  Laterality: N/A;  . ROTATOR CUFF REPAIR Right 2005  . SHOULDER ARTHROSCOPY WITH ROTATOR CUFF REPAIR Right 11/07/2015   Procedure: SHOULDER ARTHROSCOPY WITH ROTATOR CUFF REPAIR AND DEBRIDEMENT with removal of loose bodies (suture);  Surgeon:  Jones BroomJustin Chandler, MD;  [facility];  Service: Orthopedics;  Laterality: Right;  . TONSILLECTOMY    . TOTAL HIP ARTHROPLASTY Right 12/02/2014   Procedure: TOTAL HIP ARTHROPLASTY;  Surgeon: Frederico Hammananiel Caffrey, MD;  [facility];  Service: Orthopedics;  Laterality: Right;  . TOTAL KNEE ARTHROPLASTY Right 11/26/2013   dr Madelon Lipscaffrey  . TOTAL KNEE ARTHROPLASTY Right 11/26/2013   Procedure: RIGHT TOTAL KNEE ARTHROPLASTY;  Surgeon: Thera FlakeW D Caffrey Jr., MD;  [facility];  Service: Orthopedics;  Laterality: Right;    Current Medications: Prior to Admission medications   Medication Sig Start Date End Date Taking? Authorizing Provider  acetaminophen (TYLENOL) 325 MG tablet Take 2 tablets (650 mg total) by mouth every 4 (four) hours as needed for headache or mild pain. 08/26/17   Zannie CoveJoseph, Preetha, MD  atorvastatin (LIPITOR) 20 MG tablet Take 1 tablet (20 mg total) by mouth daily. 04/21/17   Corky CraftsVaranasi, Jayadeep S, MD  doxycycline (VIBRA-TABS) 100 MG tablet Take 1 tablet (100 mg total) by mouth every 12 (twelve) hours. For 9days 08/26/17   Zannie CoveJoseph, Preetha, MD  ELIQUIS 5 MG TABS tablet TAKE 1 TABLET BY MOUTH TWICE DAILY 09/08/17   Duke SalviaKlein, Steven C, MD  lisinopril (PRINIVIL,ZESTRIL) 10 MG tablet Take 1 tablet (10 mg total) by mouth daily. 04/24/17   Duke SalviaKlein, Steven C, MD  metoprolol tartrate (LOPRESSOR) 25 MG tablet Take 1 tablet (25 mg total) by mouth 2 (two) times daily. 08/26/17   Zannie CoveJoseph, Preetha, MD  nitroGLYCERIN (NITROSTAT) 0.4 MG SL tablet Place 1 tablet (0.4 mg total) under the tongue every 5 (five) minutes as needed. 01/13/17   Arty Baumgartneroberts, Lindsay B, NP  prednisoLONE acetate (PRED FORTE) 1 % ophthalmic suspension Place 1 drop into the left eye daily.     [provider]    Allergies:   Nucynta [tapentadol]; Codeine; Hydrocodone; and Oxycodone   Social History   Socioeconomic History  . Marital status: Married    Spouse name: Not on file  . Number of children: Not on file  . Years of education: Not on  file  . Highest education level: Not on file  Occupational History  . Not on file  Social Needs  . Financial resource strain: Not on file  . Food insecurity:    Worry: Not on file    Inability: Not on file  . Transportation needs:    Medical: Not on file    Non-medical: Not on file  Tobacco Use  . Smoking status: Former Smoker    Packs/day: 1.00    Types: Cigarettes    Last attempt to quit: 10/12/2013    Years since quitting: 3.9  . Smokeless tobacco: Never Used  Substance and Sexual Activity  . Alcohol use: Yes    Comment: ocassional  . Drug use: No  . Sexual activity: Not on file  Lifestyle  . Physical activity:    Days per week: Not on file    Minutes per session: Not on  file  . Stress: Not on file  Relationships  . Social connections:    Talks on phone: Not on file    Gets together: Not on file    Attends religious service: Not on file    Active member of club or organization: Not on file    Attends meetings of clubs or organizations: Not on file    Relationship status: Not on file  Other Topics Concern  . Not on file  Social History Narrative  . Not on file     Family History:  The patient's family history includes Cancer in his father and unknown relative; Heart disease in his mother and unknown relative; Hypertension in his unknown relative.  ROS:   Please see the history of present illness.    ROS All other systems reviewed and are negative.   PHYSICAL EXAM:   VS:  BP 112/72   Pulse 69   Ht 6\' 4"  (1.93 m)   Wt 290 lb 9.6 oz (131.8 kg)   SpO2 97%   BMI 35.37 kg/m    GEN: Well nourished, well developed, in no acute distress  HEENT: normal  Neck: no JVD, carotid bruits, or masses Cardiac: RRR; no murmurs, rubs, or gallops, trace bilateral lower extremity edema with significant varicose vein Respiratory:  clear to auscultation bilaterally, normal work of breathing GI: soft, nontender, nondistended, + BS MS: no deformity or atrophy  Skin: warm and  dry, no rash Neuro:  Alert and Oriented x 3, Strength and sensation are intact Psych: euthymic mood, full affect  Wt Readings from Last 3 Encounters:  09/17/17 290 lb 9.6 oz (131.8 kg)  08/26/17 287 lb 8 oz (130.4 kg)  07/02/17 295 lb (133.8 kg)      Studies/Labs Reviewed:   EKG:  EKG is not  ordered today.    Recent Labs: 08/26/2017: BUN 13; Creatinine, Ser 1.08; Hemoglobin 12.5; Platelets 141; Potassium 4.1; Sodium 137   Lipid Panel    Component Value Date/Time   CHOL 77 08/25/2017 0833   TRIG 45 08/25/2017 0833   HDL 26 (L) 08/25/2017 0833   CHOLHDL 3.0 08/25/2017 0833   VLDL 9 08/25/2017 0833   LDLCALC 42 08/25/2017 0833   LDLDIRECT 140.0 05/16/2014 1022    Additional studies/ records that were reviewed today include:   Echocardiogram: 10/2016 Study Conclusions  - Left ventricle: Wall thickness was increased in a pattern of mild   LVH. Systolic function was normal. The estimated ejection   fraction was in the range of 60% to 65%. Doppler parameters are   consistent with abnormal left ventricular relaxation (grade 1   diastolic dysfunction). - Aortic valve: There was trivial regurgitation. - Right ventricle: The cavity size was mildly dilated. - Tricuspid valve: There was moderate regurgitation.  Impressions:  - Compared to the prior study, there has been no significant   interval change.  Left heart cath 08/25/17:  Previously placed Mid LAD drug eluting stent is widely patent.  Dist LAD lesion is 50% stenosed.  Mid Cx lesion is 50% stenosed.  Prox RCA to Mid RCA lesion is 25% stenosed.  The left ventricular systolic function is normal.  LV end diastolic pressure is mildly elevated.  The left ventricular ejection fraction is 55-65% by visual estimate.  There is mild aortic valve gradient.  If no bleeding issues at the right wrist: Recommend to resume Apixaban, at currently prescribed dose and frequency, on 08/26/17. Concurrent antiplatelet  therapy not recommended.   ASSESSMENT & PLAN:  1. CAD s/p DES to mLAD 01/2017 - Recent cath 08/2017 showed stable disease. No intervention.  Not on aspirin due to need of anticoagulation.  Continue statin.  Will stop metoprolol due to fatigue (previously his fatigue was completely resolved while off beta-blocker by Dr. Graciela Husbands).  2. HTN -Blood pressure stable on current medication.  3. HLD Continue statin. 08/25/2017: Cholesterol 77; HDL 26; LDL Cholesterol 42; Triglycerides 45; VLDL 9  4. Atrial fibrillation s/p ablation  Compliant with Eliquis.  No bleeding issue.  He has ILR to review rhythm as now off BB.   5. Hx of syncope s/p ILR  6. DOE -He has chronic dyspnea on exertion worsening in the past few months.  Reassuring cath as above.  Patient has mild diastolic dysfunction by echocardiogram September 2018.  However he denies any orthopnea or PND.  His trace bilateral lower extremity edema is likely due to significant varicose vein excess salt intake.  Advised to cut back.  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 Refer to pulmonary.  If no cause found, may consider repeat evaluation with echocardiogram.  He is not interested in PRN Lasix.   Medication Adjustments/Labs and Tests Ordered: Current medicines are reviewed at length with the patient today.  Concerns regarding medicines are outlined above.  Medication changes, Labs and Tests ordered today are listed in the Patient Instructions below. Patient Instructions  Medication Instructions:  Your physician has recommended you make the following change in your medication:  1.  STOP the Metoprolol.   Labwork: None ordered  Testing/Procedures: None ordered   Follow-Up: You have been referred to [facility].  They will contact you to set p an appointment.  Your physician recommends  that you schedule a follow-up appointment in: 3-4 MONTHS WITH DR. VARANASI     Any Other Special Instructions Will Be Listed Below (If Applicable).     If you need a refill on your cardiac medications before your next appointment, please call your pharmacy.      Lorelei Pont, [place]  09/17/2017 11:58 AM    [facility] [address] [place], [place], [place]  [address] Phone: [phone]; Fax: [phone]

## 2017-09-17 NOTE — Patient Instructions (Addendum)
Medication Instructions:  Your physician has recommended you make the following change in your medication:  1.  STOP the Metoprolol.   Labwork: None ordered  Testing/Procedures: None ordered   Follow-Up: You have been referred to [facility].  They will contact you to set p an appointment.  Your physician recommends that you schedule a follow-up appointment in: 3-4 MONTHS WITH DR. VARANASI     Any Other Special Instructions Will Be Listed Below (If Applicable).     If you need a refill on your cardiac medications before your next appointment, please call your pharmacy.

## 2017-09-26 ENCOUNTER — Ambulatory Visit (INDEPENDENT_AMBULATORY_CARE_PROVIDER_SITE_OTHER): Payer: BLUE CROSS/BLUE SHIELD | Admitting: Pulmonary Disease

## 2017-09-26 ENCOUNTER — Encounter: Payer: Self-pay | Admitting: Pulmonary Disease

## 2017-09-26 DIAGNOSIS — R0602 Shortness of breath: Secondary | ICD-10-CM

## 2017-09-26 MED ORDER — BUDESONIDE-FORMOTEROL FUMARATE 160-4.5 MCG/ACT IN AERO: 2.0000 | INHALATION_SPRAY | Freq: Two times a day (BID) | RESPIRATORY_TRACT | 5 refills | Status: DC

## 2017-09-26 MED ORDER — BUDESONIDE-FORMOTEROL FUMARATE 160-4.5 MCG/ACT IN AERO: 2.0000 | INHALATION_SPRAY | Freq: Two times a day (BID) | RESPIRATORY_TRACT | 5 refills | Status: AC

## 2017-09-26 NOTE — Patient Instructions (Signed)
We will schedule you for pulmonary function test, high-resolution CT for better evaluation of the lung We will start you on an inhaler called Symbicort.  Use this twice daily  We will get a download from the CPAP to evaluate the therapy for sleep apnea You can use Flonase, Nasonex, saline nasal sprays over-the-counter to help with the sinus congestion.  Follow-up in 1 to 2 months

## 2017-09-26 NOTE — Progress Notes (Addendum)
Curtis Barton    [id number]    [birth year]  Primary Care Physician:Elkins, Curly RimWilson Moragne, MD  Referring Physician: Kaleen MaskElkins, Wilson Taylor, MD [address] [place] [id number]  Chief complaint: Consult for dyspnea, emphysema  HPI: 62 year old with history of coronary artery disease status post stent to mid LAD, atrial flutter, hypertension, sleep apnea. He was admitted in July 2019 for chest pain.  Underwent left heart catheterization which showed stable disease.  Also noted to have low-grade fever, malaise, mild cytopenia and treated for for tickborne illness, possible [facility] fever with antibiotics.  Referred to pulmonary for ongoing issues with dyspnea.  His symptoms have been going been going on for several years.  He has dyspnea with activity and at rest, occasional cough with white mucus.  His symptoms have not improved with revascularization in 2018.  Diagnosis sleep apnea in 2017 and is on CPAP.  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  No mold, hot tub, Jacuzzi Smoking history: 45-pack-year smoker.  Quit in 2015 Travel history: Previously lived in [place].  No significant recent travel Relevant family history: No relevant family history of lung disease.  Outpatient Encounter Medications as of 09/26/2017  Medication Sig  . acetaminophen (TYLENOL) 325 MG tablet Take 2 tablets (650 mg total) by mouth every 4 (four) hours as needed for headache or mild pain.  Marland Kitchen. atorvastatin (LIPITOR) 20 MG tablet Take 1 tablet (20 mg total) by mouth daily.  Marland Kitchen. ELIQUIS 5 MG TABS tablet TAKE 1 TABLET BY MOUTH TWICE DAILY  . lisinopril (PRINIVIL,ZESTRIL) 10 MG tablet Take 1 tablet (10 mg total) by mouth  daily.  . nitroGLYCERIN (NITROSTAT) 0.4 MG SL tablet Place 1 tablet (0.4 mg total) under the tongue every 5 (five) minutes as needed.  . prednisoLONE acetate (PRED FORTE) 1 % ophthalmic suspension Place 1 drop into the left eye daily.   . [DISCONTINUED] doxycycline (VIBRA-TABS) 100 MG tablet Take 1 tablet (100 mg total) by mouth every 12 (twelve) hours. For 9days   No facility-administered encounter medications on file as of 09/26/2017.     Allergies as of 09/26/2017 - Review Complete 09/26/2017  Allergen Reaction Noted  . Nucynta [tapentadol] Itching 12/02/2014  . Codeine Itching 10/12/2013  . Hydrocodone Itching 11/17/2013  . Oxycodone Itching 11/17/2013    Past Medical History:  Diagnosis Date  . Arthritis   . Asthma    as child- no problem  . Atrial flutter (HCC)    a. s/p ablation  . CAD (coronary artery disease)    12/18 PCI/DES x1 to mLAD, normal EF  . Difficult intubation    maybe 8 years years ago, no trouble since then  . Fatty liver   . Fuchs' uveitis syndrome    left eye  . History of kidney stones   . Hypertension   . Primary localized osteoarthritis of right knee   . Sleep apnea    home sleep study shows some sleep apnea  . Stroke (HCC)   . TIA (transient ischemic attack) 2010    Past Surgical History:  Procedure Laterality Date  . acle repair Right 2000   ACL repair  . ATRIAL ABLATION SURGERY  10/2013   Dr Graciela Husbands  . ATRIAL FLUTTER ABLATION N/A 10/13/2013   Procedure: ATRIAL FLUTTER ABLATION;  Surgeon: Duke Salvia, MD;  Location: [facility] CATH LAB;  Service: Cardiovascular;  Laterality: N/A;  . BICEPS TENDON REPAIR    . CATARACT EXTRACTION W/ INTRAOCULAR LENS  IMPLANT, BILATERAL    . CORONARY STENT INTERVENTION N/A 01/13/2017   Procedure: CORONARY STENT INTERVENTION;  Surgeon: Corky Crafts, MD;  Location: [facility] INVASIVE CV LAB;  Service: Cardiovascular;  Laterality: N/A;  . CYSTOSCOPY W/ URETERAL STENT PLACEMENT  1999  . EP IMPLANTABLE DEVICE N/A  10/12/2014   Procedure: Loop Recorder Insertion;  Surgeon: Duke Salvia, MD;  Location: [facility] INVASIVE CV LAB;  Service: Cardiovascular;  Laterality: N/A;  . EYE SURGERY    . HERNIA REPAIR    . KIDNEY SURGERY Right   . KNEE CARTILAGE SURGERY Bilateral   . LEFT HEART CATH AND CORONARY ANGIOGRAPHY N/A 08/25/2017   Procedure: LEFT HEART CATH AND CORONARY ANGIOGRAPHY;  Surgeon: Corky Crafts, MD;  Location: [facility] INVASIVE CV LAB;  Service: Cardiovascular;  Laterality: N/A;  . LEFT HEART CATHETERIZATION WITH CORONARY ANGIOGRAM N/A 05/17/2014   Procedure: LEFT HEART CATHETERIZATION WITH CORONARY ANGIOGRAM;  Surgeon: Corky Crafts, MD;  Location: [facility] CATH LAB;  Service: Cardiovascular;  Laterality: N/A;  . RIGHT/LEFT HEART CATH AND CORONARY ANGIOGRAPHY N/A 01/13/2017   Procedure: RIGHT/LEFT HEART CATH AND CORONARY ANGIOGRAPHY;  Surgeon: Corky Crafts, MD;  Location: [facility] INVASIVE CV LAB;  Service: Cardiovascular;  Laterality: N/A;  . ROTATOR CUFF REPAIR Right 2005  . SHOULDER ARTHROSCOPY WITH ROTATOR CUFF REPAIR Right 11/07/2015   Procedure: SHOULDER ARTHROSCOPY WITH ROTATOR CUFF REPAIR AND DEBRIDEMENT with removal of loose bodies (suture);  Surgeon: Jones Broom, MD;  Location: [facility] OR;  Service: Orthopedics;  Laterality: Right;  . TONSILLECTOMY    . TOTAL HIP ARTHROPLASTY Right 12/02/2014   Procedure: TOTAL HIP ARTHROPLASTY;  Surgeon: Frederico Hamman, MD;  Location[facility];  Service: Orthopedics;  Laterality: Right;  . TOTAL KNEE ARTHROPLASTY Right 11/26/2013   dr Madelon Lips  . TOTAL KNEE ARTHROPLASTY Right 11/26/2013   Procedure: RIGHT TOTAL KNEE ARTHROPLASTY;  Surgeon: Thera Flake., MD;  Location: [facility] OR;  Service: Orthopedics;  Laterality: Right;    Family History  Problem Relation Age of Onset  . Heart disease Mother   . Cancer Father   . Heart disease Unknown   . Hypertension Unknown   . Cancer Unknown     Social History   Socioeconomic History  . Marital status: Married     Spouse name: Not on file  . Number of children: Not on file  . Years of education: Not on file  . Highest education level: Not on file  Occupational History  . Not on file  Social Needs  . Financial resource strain: Not on file  . Food insecurity:    Worry: Not on file    Inability: Not on file  . Transportation needs:    Medical: Not on file    Non-medical: Not on file  Tobacco Use  . Smoking status: Former Smoker    Packs/day: 1.00    Types: Cigarettes    Last attempt to quit: 10/12/2013    Years since quitting: 3.9  . Smokeless tobacco: Never Used  Substance and Sexual Activity  . Alcohol use: Yes    Comment: ocassional  . Drug use: No  . Sexual activity: Not on file  Lifestyle  . Physical activity:  Days per week: Not on file    Minutes per session: Not on file  . Stress: Not on file  Relationships  . Social connections:    Talks on phone: Not on file    Gets together: Not on file    Attends religious service: Not on file    Active member of club or organization: Not on file    Attends meetings of clubs or organizations: Not on file    Relationship status: Not on file  . Intimate partner violence:    Fear of current or ex partner: Not on file    Emotionally abused: Not on file    Physically abused: Not on file    Forced sexual activity: Not on file  Other Topics Concern  . Not on file  Social History Narrative  . Not on file   Review of systems: Review of Systems  Constitutional: Negative for fever and chills.  HENT: Negative.   Eyes: Negative for blurred vision.  Respiratory: as per HPI  Cardiovascular: Negative for chest pain and palpitations.  Gastrointestinal: Negative for vomiting, diarrhea, blood per rectum. Genitourinary: Negative for dysuria, urgency, frequency and hematuria.  Musculoskeletal: Negative for myalgias, back pain and joint pain.  Skin: Negative for itching and rash.  Neurological: Negative for dizziness, tremors, focal weakness,  seizures and loss of consciousness.  Endo/Heme/Allergies: Negative for environmental allergies.  Psychiatric/Behavioral: Negative for depression, suicidal ideas and hallucinations.  All other systems reviewed and are negative.  Physical Exam: Blood pressure 138/86, pulse 79, height 6\' 3"  (1.905 m), weight 293 lb 6.4 oz (133.1 kg), SpO2 94 %. Gen:      No acute distress HEENT:  EOMI, sclera anicteric Neck:     No masses; no thyromegaly Lungs:    Clear to auscultation bilaterally; normal respiratory effort CV:         Regular rate and rhythm; no murmurs Abd:      + bowel sounds; soft, non-tender; no palpable masses, no distension Ext:    No edema; adequate peripheral perfusion Skin:      Warm and dry; no rash Neuro: alert and oriented x 3 Psych: normal mood and affect  Data Reviewed: Imaging CT chest 09/10/2013-apical emphysematous changes, bronchial wall thickening, bilateral groundglass opacities, reticulation.  Right lower lobe 8 mm nodule.    CT abdomen pelvis 09/17/2014- minimal left base atelectasis.  No other acute lung findings.  Chest x-ray 08/23/2017- emphysematous changes, bilateral upper lobe, apical pleural thickening. I have reviewed the images personally.  Labs CBC 12/30/2016-WBC 8.8, eos 3%, absolute eosinophil count 264  CPAP data Uses 7 hours/day, no leaks Residual AHI two-point  Assessment:  Consult for dyspnea, emphysema Previous CT reviewed with emphysematous changes We will schedule for pulmonary function test Start Symbicort inhaler  Abnormal CT, lung nodule Screening CT in 2005 showed a right lower lobe 8 mm nodule.  In addition he has some basilar groundglass opacities and reticulation suggestive of interstitial lung disease.  Will get a high-resolution CT for further evaluation  Sleep apnea He wants to transition management of CPAP to us. We will get a download for review  Has issues with sinus congestion when he uses a mask. He will use Flonase  over-the-counter and saline nasal sprays  Plan/Recommendations: - Pulmonary function test - Start Symbicort inhaler - High-resolution CT - Continue CPAP, reviewed download.  Chilton GreathousePraveen Tearra Ouk MD [facility] 09/26/2017, 12:00 PM  CC: Kaleen MaskElkins, Wilson Longwell, *

## 2017-09-30 ENCOUNTER — Telehealth: Payer: Self-pay | Admitting: Cardiology

## 2017-09-30 NOTE — Telephone Encounter (Signed)
Spoke w/ pt and requested that he send a manual transmission b/c his home monitor has not updated in at least 14 days.   

## 2017-10-08 ENCOUNTER — Ambulatory Visit (INDEPENDENT_AMBULATORY_CARE_PROVIDER_SITE_OTHER)
Admission: RE | Admit: 2017-10-08 | Discharge: 2017-10-08 | Disposition: A | Discharge status: Home / Self Care | Payer: BLUE CROSS/BLUE SHIELD | Source: Ambulatory Visit | Attending: Pulmonary Disease | Admitting: Pulmonary Disease

## 2017-10-08 DIAGNOSIS — R0602 Shortness of breath: Secondary | ICD-10-CM

## 2017-10-14 ENCOUNTER — Telehealth: Payer: Self-pay | Admitting: Cardiology

## 2017-10-14 NOTE — Telephone Encounter (Signed)
Spoke w/ pt and requested that he send a manual transmission b/c his home monitor has not updated in at least 14 days.   

## 2017-10-16 ENCOUNTER — Telehealth: Payer: Self-pay | Admitting: Pulmonary Disease

## 2017-10-16 ENCOUNTER — Ambulatory Visit (INDEPENDENT_AMBULATORY_CARE_PROVIDER_SITE_OTHER): Payer: BLUE CROSS/BLUE SHIELD | Admitting: *Deleted

## 2017-10-16 DIAGNOSIS — R55 Syncope and collapse: Secondary | ICD-10-CM

## 2017-10-16 NOTE — Telephone Encounter (Signed)
Pt returned call. Pt contact number [phone]

## 2017-10-16 NOTE — Telephone Encounter (Signed)
Notes recorded by Chilton Greathouse, MD on 10/15/2017 at 4:51 PM EDT Please let patient know CT shows mild scarring which is stable since 2015. In addition there was evidence of emphysema, coronary atherosclerosis and an aortic aneurysm. We will review in detail after he gets his PFTs.  Advised pt of results. Pt understood and nothing further is needed.   Dr. Kaleen Mask was faxed a copy per pt request.

## 2017-10-17 NOTE — Progress Notes (Signed)
Carelink Summary Report / Loop Recorder 

## 2017-10-20 ENCOUNTER — Ambulatory Visit: Payer: BLUE CROSS/BLUE SHIELD | Admitting: Physician Assistant

## 2017-10-24 ENCOUNTER — Other Ambulatory Visit: Payer: Self-pay | Admitting: Internal Medicine

## 2017-10-27 LAB — CUP PACEART REMOTE DEVICE CHECK
Date Time Interrogation Session: 20190803143516
MDC IDC PG IMPLANT DT: 20160831

## 2017-10-31 ENCOUNTER — Ambulatory Visit (INDEPENDENT_AMBULATORY_CARE_PROVIDER_SITE_OTHER): Payer: BLUE CROSS/BLUE SHIELD | Admitting: Pulmonary Disease

## 2017-10-31 ENCOUNTER — Other Ambulatory Visit (INDEPENDENT_AMBULATORY_CARE_PROVIDER_SITE_OTHER): Payer: BLUE CROSS/BLUE SHIELD

## 2017-10-31 ENCOUNTER — Encounter: Payer: Self-pay | Admitting: Pulmonary Disease

## 2017-10-31 VITALS — BP 144/74 | HR 91 | Ht 75.25 in | Wt 285.0 lb

## 2017-10-31 DIAGNOSIS — R0602 Shortness of breath: Secondary | ICD-10-CM | POA: Diagnosis not present

## 2017-10-31 DIAGNOSIS — I719 Aortic aneurysm of unspecified site, without rupture: Secondary | ICD-10-CM | POA: Diagnosis not present

## 2017-10-31 LAB — PULMONARY FUNCTION TEST
DL/VA % PRED: 71 %
DL/VA: 3.49 ml/min/mmHg/L
DLCO unc % pred: 63 %
DLCO unc: 25.01 ml/min/mmHg
FEF 25-75 POST: 4.72 L/s
FEF 25-75 Pre: 3.91 L/sec
FEF2575-%Change-Post: 20 %
FEF2575-%PRED-PRE: 115 %
FEF2575-%Pred-Post: 138 %
FEV1-%CHANGE-POST: 4 %
FEV1-%PRED-PRE: 92 %
FEV1-%Pred-Post: 96 %
FEV1-POST: 4.1 L
FEV1-PRE: 3.94 L
FEV1FVC-%CHANGE-POST: 0 %
FEV1FVC-%PRED-PRE: 107 %
FEV6-%Change-Post: 3 %
FEV6-%PRED-PRE: 89 %
FEV6-%Pred-Post: 92 %
FEV6-Post: 5.01 L
FEV6-Pre: 4.85 L
FEV6FVC-%Change-Post: 0 %
FEV6FVC-%Pred-Post: 103 %
FEV6FVC-%Pred-Pre: 103 %
FVC-%Change-Post: 3 %
FVC-%PRED-PRE: 86 %
FVC-%Pred-Post: 89 %
FVC-POST: 5.04 L
FVC-Pre: 4.88 L
POST FEV6/FVC RATIO: 99 %
PRE FEV1/FVC RATIO: 81 %
PRE FEV6/FVC RATIO: 99 %
Post FEV1/FVC ratio: 81 %
RV % PRED: 76 %
RV: 1.97 L
TLC % PRED: 84 %
TLC: 6.84 L

## 2017-10-31 LAB — COMPREHENSIVE METABOLIC PANEL
ALK PHOS: 65 U/L (ref 39–117)
ALT: 22 U/L (ref 0–53)
AST: 18 U/L (ref 0–37)
Albumin: 4.1 g/dL (ref 3.5–5.2)
BUN: 17 mg/dL (ref 6–23)
CO2: 27 mEq/L (ref 19–32)
Calcium: 9.3 mg/dL (ref 8.4–10.5)
Chloride: 105 mEq/L (ref 96–112)
Creatinine, Ser: 1.11 mg/dL (ref 0.40–1.50)
GFR: 71.44 mL/min (ref >60.00)
GLUCOSE: 101 mg/dL — AB (ref 70–99)
POTASSIUM: 4.1 meq/L (ref 3.5–5.1)
Sodium: 137 mEq/L (ref 135–145)
TOTAL PROTEIN: 7.3 g/dL (ref 6.0–8.3)
Total Bilirubin: 0.6 mg/dL (ref 0.2–1.2)

## 2017-10-31 LAB — CBC WITH DIFFERENTIAL/PLATELET
Basophils Absolute: 0.1 10*3/uL (ref 0.0–0.1)
Basophils Relative: 1 % (ref 0.0–3.0)
EOS PCT: 2.4 % (ref 0.0–5.0)
Eosinophils Absolute: 0.2 10*3/uL (ref 0.0–0.7)
HCT: 41.2 % (ref 39.0–52.0)
Hemoglobin: 13.8 g/dL (ref 13.0–17.0)
LYMPHS ABS: 2 10*3/uL (ref 0.7–4.0)
Lymphocytes Relative: 29.8 % (ref 12.0–46.0)
MCHC: 33.5 g/dL (ref 30.0–36.0)
MCV: 98.1 fl (ref 78.0–100.0)
MONOS PCT: 8.6 % (ref 3.0–12.0)
Monocytes Absolute: 0.6 10*3/uL (ref 0.1–1.0)
NEUTROS PCT: 58.2 % (ref 43.0–77.0)
Neutro Abs: 3.9 10*3/uL (ref 1.4–7.7)
Platelets: 194 10*3/uL (ref 150.0–400.0)
RBC: 4.2 Mil/uL — AB (ref 4.22–5.81)
RDW: 14.4 % (ref 11.5–15.5)
WBC: 6.7 10*3/uL (ref 4.0–10.5)

## 2017-10-31 LAB — CK: CK TOTAL: 177 U/L (ref 7–232)

## 2017-10-31 NOTE — Progress Notes (Signed)
JAHDEN SCHARA    [id number]    62  Primary Care Physician:Elkins, Curly Rim, MD  Referring Physician: Kaleen Mask, MD [address] [place], [place] [address]  Chief complaint: Consult for dyspnea, emphysema  HPI: 62 year old with history of coronary artery disease status post stent to mid LAD, atrial flutter, hypertension, sleep apnea. He was admitted in July 2019 for chest pain.  Underwent left heart catheterization which showed stable disease.  Also noted to have low-grade fever, malaise, mild cytopenia and treated for for tickborne illness, possible [facility] spotted fever with antibiotics.  Referred to pulmonary for ongoing issues with dyspnea.  His symptoms have been going been going on for several years.  He has dyspnea with activity and at rest, occasional cough with white mucus.  His symptoms have not improved with revascularization in 2018.  Diagnosis sleep apnea in 2017 and is on CPAP.  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  No mold, hot tub, Jacuzzi Smoking history: 45-pack-year smoker.  Quit in 2015 Travel history: Previously lived in [place] in [place].  No significant recent travel Relevant family history: No relevant family history of lung disease.  Interim history: Started on Symbicort.  He cannot tell if his breathing is better but his coworkers have told him there is an improvement with less observed dyspnea Chief complaint today is leg and arm cramping that worsen towards the end of the day and at night. Also has significant sinus drainage when he wakes up in the morning after CPAP use.  Outpatient Encounter Medications as of 10/31/2017  Medication Sig  . acetaminophen  (TYLENOL) 325 MG tablet Take 2 tablets (650 mg total) by mouth every 4 (four) hours as needed for headache or mild pain.  Marland Kitchen atorvastatin (LIPITOR) 20 MG tablet Take 1 tablet (20 mg total) by mouth daily.  . budesonide-formoterol (SYMBICORT) 160-4.5 MCG/ACT inhaler Inhale 2 puffs into the lungs 2 (two) times daily.  Marland Kitchen ELIQUIS 5 MG TABS tablet TAKE 1 TABLET BY MOUTH TWICE DAILY  . lisinopril (PRINIVIL,ZESTRIL) 10 MG tablet Take 1 tablet (10 mg total) by mouth daily.  . nitroGLYCERIN (NITROSTAT) 0.4 MG SL tablet Place 1 tablet (0.4 mg total) under the tongue every 5 (five) minutes as needed.  . prednisoLONE acetate (PRED FORTE) 1 % ophthalmic suspension Place 1 drop into the left eye daily.    No facility-administered encounter medications on file as of 10/31/2017.    Physical Exam: Blood pressure (!) 144/74, pulse 91, SpO2 96 %. Gen:      No acute distress HEENT:  EOMI, sclera anicteric Neck:     No masses; no thyromegaly Lungs:    Clear to auscultation bilaterally; normal respiratory effort CV:         Regular rate and rhythm; no murmurs Abd:      + bowel sounds; soft, non-tender; no palpable masses, no distension Ext:    No edema; adequate peripheral perfusion Skin:      Warm and dry; no rash Neuro: alert and oriented x 3 Psych: normal mood and affect  Data Reviewed: Imaging CT chest 09/10/2013-apical emphysematous changes, bronchial wall thickening, bilateral groundglass opacities, reticulation.  Right lower lobe 8  mm nodule.    CT abdomen pelvis 09/17/2014- minimal left base atelectasis.  No other acute lung findings.  High res CT chest 10/08/2017-centrilobular and paraseptal emphysema, mild basilar reticulation, traction bronchiectasis Stable 6 mm right lower lobe nodule, 4.5 cm ascending aortic aneurysm.   I have reviewed the images personally  PFTs 10/31/2017- FVC 5.04 [89%], FEV1 4.10 [96%), F/F 81, TLC 84%, DLCO 63% Moderate diffusion impairment.  Cardiac  Echo 11/05/2016- EF  60-65%, grade 1 diastolic dysfunction, moderate TR.  PA systolic pressure at upper limits of normal.  Labs CBC 12/30/2016-WBC 8.8, eos 3%, absolute eosinophil count 264  Sleep Auto download 09/30/2017-10/29/2017 100% usage, set to 5 to 10 cm water, 95th percentile pressure 9 leaks 1.7, residual AHI 1.5  Assessment:  Emphysema PFTs reviewed with no significant obstruction.  There is evidence of small airway disease He has responded clinically to Symbicort.  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  He is concerned about that as there is a family history of aneurysm rupture Refer to cardiothoracic surgery  Lung nodule Stable on follow-up CT  Sleep apnea Download reviewed with good response Advised him to reduce his humidification settings as has C/O increased sinus drainage in mornings Continue Flonase, saline nasal sprays.  Leg and arm cramps Pt is on statin.  Check CBC, metabolic panel, CK, aldolase  Plan/Recommendations: - Continue Symbicort - Check CK, aldolase, CBC differential, comprehensive metabolic panel, rheumatoid factor, CCP, ANA, hypersensitivity profile,  IgE, myositis panel - Continue CPAP - Referral to cardiothoracic surgery.  Chilton GreathousePraveen Marigny Borre MD [facility] 10/31/2017, 11:10 AM  CC: Kaleen MaskElkins, Wilson Minton, *

## 2017-10-31 NOTE — Progress Notes (Signed)
PFT done today. 

## 2017-10-31 NOTE — Patient Instructions (Addendum)
We will get some blood test today including CBC differential, comprehensive metabolic panel, rheumatoid factor, CCP, ANA, hypersensitivity profile,  IgE  Continue the Symbicort Continue the CPAP  We will make referral to cardiothoracic surgery for evaluation of the aortic aneurysm  Follow-up in 6 months.

## 2017-11-03 LAB — IGE: IgE (Immunoglobulin E), Serum: 2404 kU/L — ABNORMAL HIGH (ref <=114)

## 2017-11-03 LAB — ANA,IFA RA DIAG PNL W/RFLX TIT/PATN: ANA: NEGATIVE

## 2017-11-03 LAB — ALDOLASE: Aldolase: 5.4 U/L (ref <=8.1)

## 2017-11-06 LAB — MYOSITIS PANEL III: RNP: 1.4 U/mL

## 2017-11-06 LAB — HYPERSENSITIVITY PNEUMONITIS
A. PULLULANS ABS: NEGATIVE
A.Fumigatus #1 Abs: NEGATIVE
Micropolyspora faeni, IgG: NEGATIVE
PIGEON SERUM ABS: NEGATIVE
THERMOACT. SACCHARII: NEGATIVE
Thermoactinomyces vulgaris, IgG: NEGATIVE

## 2017-11-07 LAB — CUP PACEART REMOTE DEVICE CHECK
Implantable Pulse Generator Implant Date: 20160831
MDC IDC SESS DTM: 20190905143909

## 2017-11-18 ENCOUNTER — Ambulatory Visit (INDEPENDENT_AMBULATORY_CARE_PROVIDER_SITE_OTHER): Payer: BLUE CROSS/BLUE SHIELD | Admitting: *Deleted

## 2017-11-18 DIAGNOSIS — R55 Syncope and collapse: Secondary | ICD-10-CM | POA: Diagnosis not present

## 2017-11-19 NOTE — Progress Notes (Signed)
Carelink Summary Report / Loop Recorder 

## 2017-11-27 ENCOUNTER — Telehealth: Payer: Self-pay | Admitting: *Deleted

## 2017-11-27 NOTE — Telephone Encounter (Signed)
4sec daytime pause episode detected on 11/25/17, alert received this morning.   LMOM to return call to [facility].

## 2017-11-27 NOTE — Telephone Encounter (Signed)
Curtis Barton reports that he has had 3 episodes this week that have lasted longer than previous episodes. He reports that he feels a fluttering, shortness of breath and dizziness. He notes that he feels a pause and skipped beats after these episodes. 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 Not available in Care Everywhere at this time.  I will have Dr. Graciela Husbands review the episode and I will relay any recommendations to [name].

## 2017-12-03 LAB — CUP PACEART REMOTE DEVICE CHECK
Date Time Interrogation Session: 20191008153918
Implantable Pulse Generator Implant Date: 20160831

## 2017-12-04 NOTE — Telephone Encounter (Signed)
Dr. Graciela Husbands spoke with Mr. Vandyne this afternoon and Mr. Braley reports feeling pre-syncopal with the 4sec pause on 11/25/17. He reports to Dr. Graciela Husbands that he marked a symptom episode last week for similar symptoms.   I called Mr. Mcdonagh to send a manual transmission due to no episodes transmitted since 11/27/17. No new episodes on full report today. He reports that he was trying to mark the episode without alerting his wife and may not have pressed the button hard/Ethyl Vila enough. We will continue to monitor and I have advised him to use the symptom activator per Dr. Odessa Fleming instructions.   Per Dr. Graciela Husbands- no need for EKG from [facility]. Ok to proceed with corneal transplant next week.  Patient verbalizes understanding and is appreciative.

## 2017-12-08 ENCOUNTER — Other Ambulatory Visit: Payer: Self-pay | Admitting: Internal Medicine

## 2017-12-17 ENCOUNTER — Encounter: Payer: BLUE CROSS/BLUE SHIELD | Admitting: Surgery

## 2017-12-22 ENCOUNTER — Ambulatory Visit (INDEPENDENT_AMBULATORY_CARE_PROVIDER_SITE_OTHER): Payer: BLUE CROSS/BLUE SHIELD | Admitting: *Deleted

## 2017-12-22 ENCOUNTER — Telehealth: Payer: Self-pay | Admitting: Cardiology

## 2017-12-22 ENCOUNTER — Encounter: Payer: Self-pay | Admitting: Interventional Cardiology

## 2017-12-22 DIAGNOSIS — R55 Syncope and collapse: Secondary | ICD-10-CM

## 2017-12-22 DIAGNOSIS — R002 Palpitations: Secondary | ICD-10-CM

## 2017-12-22 NOTE — Telephone Encounter (Signed)
Spoke w/ pt and requested that he send a manual transmission b/c his home monitor has not updated in at least 14 days.   

## 2017-12-23 NOTE — Progress Notes (Signed)
Carelink Summary Report / Loop Recorder 

## 2018-01-09 NOTE — Progress Notes (Signed)
Cardiology Office Note   Date:  01/12/2018   ID:  Curtis, Barton Dec 23, 1955, MRN [id number]  PCP:  Curtis Mask, MD    No chief complaint on file.  CAD  Wt Readings from Last 3 Encounters:  01/12/18 297 lb (134.7 kg)  10/31/17 285 lb (129.3 kg)  09/26/17 293 lb 6.4 oz (133.1 kg)       History of Present Illness: Curtis Barton is a 62 y.o. male  With a h/o AFib.  He was diagnosed with CAD in 2018.  He had a cath in 12/18:  Prox RCA to Mid RCA lesion is 25% stenosed.  Mid Cx lesion is 50% stenosed.  Mid LAD lesion is 75% stenosed.  A drug-eluting stent was successfully placed using a STENT SYNERGY DES 3X24, postdilated to 3.5 mm.  Post intervention, there is a 0% residual stenosis.  Dist LAD lesion is 50% stenosed, appeared unchanged from prior. Lesion is past a very tortuous distal LAD.  LV end diastolic pressure is normal, 13 mm Hg.  The left ventricular ejection fraction is 55-65% by visual estimate.  There is no aortic valve stenosis.  Ao sat 98%; PA sat 69%; CO 5.8 L/min; CI 2.28. Normal PA pressures. PCWP mean 13 mm Hg.  JR5, JL4 used from the right radial artery.  Continue aggressive secondary prevention. Plan for same day PCI.  Restart Eliquis tomorrow.  Will continue aspirin, Plavix and Eliquis for 30 days. After 30 days, stop aspirin. Continue Plavix and Eliquis together for 6 months. At that point, could consider stopping Plavix. If there is a bleeding issue, could consider  Stopping Plavix as soon as 3 months. Synergy stent was used so that antiplatelet therapy duration could be shortened if needed.  He has had some flutters in his chest.  He has an implantable loop recorder.  He complained of easy bleeding on Eliquis and Plavix.  Exercise was recommended for DOE. Atorvastatin 20 mg was started for hyperlipidemia.  Since the last visit, his insurance has changed and he will be transferring care to [facility],  otherwise he will have to pay $710/month extra.  He uses the [employer] for insurance.  He will be seeing Dr. Onalee Hua and Dr. Lowella Barton at [facility].  Denies : exertional Chest pain. Dizziness. Leg edema. Nitroglycerin use. Orthopnea. Palpitations. Paroxysmal nocturnal dyspnea. Shortness of breath. Syncope.   Now off of Plavix.  He continues Eliquis.    He has gained some weight since the last few months.  Activity was limited by recent eye surgery.    Past Medical History:  Diagnosis Date  . Arthritis   . Asthma    as child- no problem  . Atrial flutter (HCC)    a. s/p ablation  . CAD (coronary artery disease)    12/18 PCI/DES x1 to mLAD, normal EF  . Difficult intubation    maybe 8 years years ago, no trouble since then  . Fatty liver   . Fuchs' uveitis syndrome    left eye  . History of kidney stones   . Hypertension   . Primary localized osteoarthritis of right knee   . Sleep apnea    home sleep study shows some sleep apnea  . Stroke (HCC)   . TIA (transient ischemic attack) 2010    Past Surgical History:  Procedure Laterality Date  . acle repair Right 2000   ACL repair  . ATRIAL ABLATION SURGERY  10/2013   Dr Graciela Husbands  . ATRIAL  FLUTTER ABLATION N/A 10/13/2013   Procedure: ATRIAL FLUTTER ABLATION;  Surgeon: Duke SalviaSteven C Klein, MD;  [facility];  Service: Cardiovascular;  Laterality: N/A;  . BICEPS TENDON REPAIR    . CATARACT EXTRACTION W/ INTRAOCULAR LENS  IMPLANT, BILATERAL    . CORONARY STENT INTERVENTION N/A 01/13/2017   Procedure: CORONARY STENT INTERVENTION;  Surgeon: Corky Crafts,  S, MD;  [facility];  Service: Cardiovascular;  Laterality: N/A;  . CYSTOSCOPY W/ URETERAL STENT PLACEMENT  1999  . EP IMPLANTABLE DEVICE N/A 10/12/2014   Procedure: Loop Recorder Insertion;  Surgeon: Duke SalviaSteven C Klein, MD;  [facility];  Service: Cardiovascular;  Laterality: N/A;  . EYE SURGERY    . HERNIA REPAIR    . KIDNEY SURGERY Right   . KNEE CARTILAGE  SURGERY Bilateral   . LEFT HEART CATH AND CORONARY ANGIOGRAPHY N/A 08/25/2017   Procedure: LEFT HEART CATH AND CORONARY ANGIOGRAPHY;  Surgeon: Corky Crafts,  S, MD;  [facility];  Service: Cardiovascular;  Laterality: N/A;  . LEFT HEART CATHETERIZATION WITH CORONARY ANGIOGRAM N/A 05/17/2014   Procedure: LEFT HEART CATHETERIZATION WITH CORONARY ANGIOGRAM;  Surgeon: Corky Crafts S , MD;  [facility];  Service: Cardiovascular;  Laterality: N/A;  . RIGHT/LEFT HEART CATH AND CORONARY ANGIOGRAPHY N/A 01/13/2017   Procedure: RIGHT/LEFT HEART CATH AND CORONARY ANGIOGRAPHY;  Surgeon: Corky Crafts,  S, MD;  [facility];  Service: Cardiovascular;  Laterality: N/A;  . ROTATOR CUFF REPAIR Right 2005  . SHOULDER ARTHROSCOPY WITH ROTATOR CUFF REPAIR Right 11/07/2015   Procedure: SHOULDER ARTHROSCOPY WITH ROTATOR CUFF REPAIR AND DEBRIDEMENT with removal of loose bodies (suture);  Surgeon: Jones BroomJustin Chandler, MD;  [facility];  Service: Orthopedics;  Laterality: Right;  . TONSILLECTOMY    . TOTAL HIP ARTHROPLASTY Right 12/02/2014   Procedure: TOTAL HIP ARTHROPLASTY;  Surgeon: Frederico Hammananiel Caffrey, MD;  [facility];  Service: Orthopedics;  Laterality: Right;  . TOTAL KNEE ARTHROPLASTY Right 11/26/2013   dr Madelon Lipscaffrey  . TOTAL KNEE ARTHROPLASTY Right 11/26/2013   Procedure: RIGHT TOTAL KNEE ARTHROPLASTY;  Surgeon: Thera FlakeW D Caffrey Jr., MD;  Location: [facility] OR;  Service: Orthopedics;  Laterality: Right;     Current Outpatient Medications  Medication Sig Dispense Refill  . acetaminophen (TYLENOL) 325 MG tablet Take 2 tablets (650 mg total) by mouth every 4 (four) hours as needed for headache or mild pain.    Marland Kitchen. atorvastatin (LIPITOR) 20 MG tablet Take 1 tablet (20 mg total) by mouth daily. 90 tablet 3  . budesonide-formoterol (SYMBICORT) 160-4.5 MCG/ACT inhaler Inhale 2 puffs into the lungs 2 (two) times daily. 1 Inhaler 5  . ELIQUIS 5 MG TABS tablet TAKE 1 TABLET BY MOUTH  TWICE DAILY 180 tablet 1  . lisinopril (PRINIVIL,ZESTRIL) 10 MG tablet Take 1 tablet (10 mg total) by mouth daily. 90 tablet 3  . nitroGLYCERIN (NITROSTAT) 0.4 MG SL tablet Place 1 tablet (0.4 mg total) under the tongue every 5 (five) minutes as needed. 25 tablet 2  . prednisoLONE acetate (PRED FORTE) 1 % ophthalmic suspension Place 1 drop into the left eye daily.      No current facility-administered medications for this visit.     Allergies:   Nucynta [tapentadol]; Codeine; Hydrocodone; and Oxycodone    Social History:  The patient  reports that he quit smoking about 4 years ago. His smoking use included cigarettes. He smoked 1.00 pack per day. He has never used smokeless tobacco. He reports that  he drinks alcohol. He reports that he does not use drugs.   Family History:  The patient's family history includes Cancer in his father and unknown relative; Heart disease in his mother and unknown relative; Hypertension in his unknown relative.    ROS:  Please see the history of present illness.   Otherwise, review of systems are positive for weight gain.   All other systems are reviewed and negative.    PHYSICAL EXAM: VS:  BP 138/84   Pulse 74   Ht 6' 3.25" (1.911 m)   Wt 297 lb (134.7 kg)   SpO2 91%   BMI 36.88 kg/m  , BMI Body mass index is 36.88 kg/m. GEN: Well nourished, well developed, in no acute distress  HEENT: normal  Neck: no JVD, carotid bruits, or masses Cardiac: RRR; no murmurs, rubs, or gallops,no edema  Respiratory:  clear to auscultation bilaterally, normal work of breathing GI: soft, nontender, nondistended, + BS MS: no deformity or atrophy  Skin: warm and dry, no rash Neuro:  Strength and sensation are intact Psych: euthymic mood, full affect     Recent Labs: 10/31/2017: ALT 22; BUN 17; Creatinine, Ser 1.11; Hemoglobin 13.8; Platelets 194.0; Potassium 4.1; Sodium 137   Lipid Panel    Component Value Date/Time   CHOL 77 08/25/2017 0833   TRIG 45 08/25/2017  0833   HDL 26 (L) 08/25/2017 0833   CHOLHDL 3.0 08/25/2017 0833   VLDL 9 08/25/2017 0833   LDLCALC 42 08/25/2017 0833   LDLDIRECT 140.0 05/16/2014 1022     Other studies Reviewed: Additional studies/ records that were reviewed today with results demonstrating: labs reviewed.   ASSESSMENT AND PLAN:  1. CAD: No angina on medical therapy.  Continue exercise when allowed to do so from an eye.   2. DOE: Was instructed to increase exercise at last visit.  3. Hyperlipidemia: LDL 42. Continue atorvastatin.  4. THoracic aortic aneurysm: 4.5 cm in 8/19.  He will need another study in 2020 August.   5. Loop recorder per EP. 6. Anticoagulated: COntinue Eliquis.    Current medicines are reviewed at length with the patient today.  The patient concerns regarding his medicines were addressed.  The following changes have been made:  No change  Labs/ tests ordered today include:  No orders of the defined types were placed in this encounter.   Recommend 150 minutes/week of aerobic exercise Low fat, low carb, high fiber diet recommended  Disposition:   FU as needed   Signed, Lance Muss, MD  01/12/2018 9:05 AM    [facility] [address] [place], [place], [place]  [address] Phone: [phone]; Fax: [phone]

## 2018-01-12 ENCOUNTER — Ambulatory Visit (INDEPENDENT_AMBULATORY_CARE_PROVIDER_SITE_OTHER): Payer: BLUE CROSS/BLUE SHIELD | Admitting: Interventional Cardiology

## 2018-01-12 ENCOUNTER — Encounter: Payer: Self-pay | Admitting: Interventional Cardiology

## 2018-01-12 ENCOUNTER — Telehealth: Payer: Self-pay | Admitting: Pulmonary Disease

## 2018-01-12 VITALS — BP 138/84 | HR 74 | Ht 75.25 in | Wt 297.0 lb

## 2018-01-12 DIAGNOSIS — R0609 Other forms of dyspnea: Secondary | ICD-10-CM

## 2018-01-12 DIAGNOSIS — Z7901 Long term (current) use of anticoagulants: Secondary | ICD-10-CM

## 2018-01-12 DIAGNOSIS — I712 Thoracic aortic aneurysm, without rupture, unspecified: Secondary | ICD-10-CM

## 2018-01-12 DIAGNOSIS — I2510 Atherosclerotic heart disease of native coronary artery without angina pectoris: Secondary | ICD-10-CM | POA: Diagnosis not present

## 2018-01-12 DIAGNOSIS — E782 Mixed hyperlipidemia: Secondary | ICD-10-CM

## 2018-01-12 DIAGNOSIS — R0600 Dyspnea, unspecified: Secondary | ICD-10-CM

## 2018-01-12 DIAGNOSIS — R0602 Shortness of breath: Secondary | ICD-10-CM

## 2018-01-12 MED ORDER — ATORVASTATIN CALCIUM 20 MG PO TABS: 20.0000 mg | ORAL_TABLET | Freq: Every day | ORAL | 1 refills | Status: AC

## 2018-01-12 MED ORDER — APIXABAN 5 MG PO TABS: 5.0000 mg | ORAL_TABLET | Freq: Two times a day (BID) | ORAL | 1 refills | Status: AC

## 2018-01-12 NOTE — Patient Instructions (Signed)
Medication Instructions:  Your physician recommends that you continue on your current medications as directed. Please refer to the Current Medication list given to you today.  If you need a refill on your cardiac medications before your next appointment, please call your pharmacy.   Lab work: None Ordered  If you have labs (blood work) drawn today and your tests are completely normal, you will receive your results only by: . MyChart Message (if you have MyChart) OR . A paper copy in the mail If you have any lab test that is abnormal or we need to change your treatment, we will call you to review the results.  Testing/Procedures: None ordered  Follow-Up: . AS NEEDED  Any Other Special Instructions Will Be Listed Below (If Applicable).    

## 2018-01-12 NOTE — Telephone Encounter (Signed)
Called and spoke with patient, he stated that due to his insurance he has to see a pulmonologist with [facility]. He is needing a referral to one.   Dr. Isaiah SergeMannam please advise thank you.

## 2018-01-13 NOTE — Telephone Encounter (Signed)
Ok to make the referral. 

## 2018-01-13 NOTE — Telephone Encounter (Signed)
Order has been placed for pt to have the referral. Called and spoke with pt letting him know this was done. Pt expressed understanding. Nothing further needed.

## 2018-01-21 ENCOUNTER — Encounter: Payer: BLUE CROSS/BLUE SHIELD | Admitting: Surgery

## 2018-01-23 ENCOUNTER — Ambulatory Visit (INDEPENDENT_AMBULATORY_CARE_PROVIDER_SITE_OTHER): Payer: BLUE CROSS/BLUE SHIELD

## 2018-01-23 DIAGNOSIS — R55 Syncope and collapse: Secondary | ICD-10-CM | POA: Diagnosis not present

## 2018-01-26 NOTE — Progress Notes (Signed)
Carelink Summary Report / Loop Recorder 

## 2018-01-27 ENCOUNTER — Telehealth: Payer: Self-pay | Admitting: *Deleted

## 2018-01-29 ENCOUNTER — Telehealth: Payer: Self-pay

## 2018-01-29 NOTE — Telephone Encounter (Signed)
LMOVM requesting that pt send manual transmission b/c home monitor has not updated in at least 14 days.    

## 2018-01-30 ENCOUNTER — Telehealth: Payer: Self-pay | Admitting: *Deleted

## 2018-01-30 NOTE — Telephone Encounter (Signed)
Called patient regarding symptom + tachy episode from 01/24/18 and tachy episode from 01/23/18 (SVT for both). Patient states that he did not recall any symptoms with the episode from 12/13, but states that he felt palpitation on 12/14. Patient states that it felt similar to AFL, but not as rapid. Patient states that he was drained for a short while after the episode.   Patient states that he also has some ongoing CP. Patient states that he most recent episode occurred last night in his truck during a work meeting. Patient states that he was sitting still at the time of the episode. Patient states that the L chest pain radiated to his L arm and lasted 10minutes.Patient did not take NTG. CP resolved without intervention.   Stent 1 year ago, stable LHC in 08/2017 (LAD stent was widely patent). Normal LVEF.   I counseled patient about appropriate NTG usage and ER precautions.   Will forward information to Dr.Klein and notify patient if anything further is recommended.

## 2018-02-08 ENCOUNTER — Other Ambulatory Visit: Payer: Self-pay | Admitting: Internal Medicine

## 2018-02-08 DIAGNOSIS — I10 Essential (primary) hypertension: Secondary | ICD-10-CM

## 2018-02-10 LAB — CUP PACEART REMOTE DEVICE CHECK
Date Time Interrogation Session: 20191110153754
MDC IDC PG IMPLANT DT: 20160831

## 2018-02-25 ENCOUNTER — Ambulatory Visit (INDEPENDENT_AMBULATORY_CARE_PROVIDER_SITE_OTHER): Payer: BLUE CROSS/BLUE SHIELD

## 2018-02-25 DIAGNOSIS — R55 Syncope and collapse: Secondary | ICD-10-CM | POA: Diagnosis not present

## 2018-02-26 NOTE — Progress Notes (Signed)
Carelink Summary Report / Loop Recorder 

## 2018-02-27 LAB — CUP PACEART REMOTE DEVICE CHECK
MDC IDC PG IMPLANT DT: 20160831
MDC IDC SESS DTM: 20200115154112

## 2018-03-01 LAB — CUP PACEART REMOTE DEVICE CHECK
Date Time Interrogation Session: 20191213153929
Implantable Pulse Generator Implant Date: 20160831

## 2018-04-07 NOTE — Telephone Encounter (Signed)
Spoke w/ pt and informed him that his information was released to [facility] on 03-10-2018. Patient is upset b/c he started correspondence about his insurance changing in November. Informed pt that I would reach out to management and someone would call him back. Pt verbalized understanding.

## 2018-04-08 NOTE — Telephone Encounter (Signed)
Billing will contact patient regarding this matter.

## 2018-04-27 ENCOUNTER — Other Ambulatory Visit: Payer: Self-pay | Admitting: Internal Medicine

## 2018-06-16 NOTE — Telephone Encounter (Signed)
Per phone note from 04/08/18, plan was to request billing department to reach out to patient regarding his billing concerns. Unsure of status. No new charges have been filed since 02/25/18. Will await update.

## 2018-06-17 NOTE — Telephone Encounter (Signed)
This was sent to charge correction on 04/08/2018.  I sent again today, copying the [facility].

## 2019-05-10 MED ORDER — MIDAZOLAM HCL 5 MG/5ML IJ SOLN
2.00 | INTRAMUSCULAR | Status: DC
Start: ? — End: 2019-05-10

## 2019-05-10 MED ORDER — HYDROMORPHONE HCL 1 MG/ML IJ SOLN
2.00 | INTRAMUSCULAR | Status: DC
Start: ? — End: 2019-05-10

## 2019-05-10 MED ORDER — ATROPINE SULFATE 1 % OP SOLN
1.00 | OPHTHALMIC | Status: DC
Start: ? — End: 2019-05-10

## 2019-05-10 MED ORDER — GLYCOPYRROLATE 0.4 MG/2ML IJ SOLN
0.10 | INTRAMUSCULAR | Status: DC
Start: ? — End: 2019-05-10

## 2019-05-10 MED ORDER — GENERIC EXTERNAL MEDICATION
0.00 | Status: DC
Start: ? — End: 2019-05-10

## 2019-05-13 DEATH — deceased

## 2019-06-21 ENCOUNTER — Encounter: Payer: Self-pay | Admitting: General Practice
